# Patient Record
Sex: Female | Born: 1940 | Race: White | Hispanic: No | Smoking: Former smoker
Health system: Southern US, Community
[De-identification: ages and names within clinical notes are randomized; demographics above are authoritative.]

## PROBLEM LIST (undated history)

## (undated) DIAGNOSIS — I2510 Atherosclerotic heart disease of native coronary artery without angina pectoris: Secondary | ICD-10-CM

## (undated) DIAGNOSIS — I10 Essential (primary) hypertension: Secondary | ICD-10-CM

## (undated) DIAGNOSIS — L98499 Non-pressure chronic ulcer of skin of other sites with unspecified severity: Secondary | ICD-10-CM

## (undated) DIAGNOSIS — I219 Acute myocardial infarction, unspecified: Secondary | ICD-10-CM

## (undated) DIAGNOSIS — C8290 Follicular lymphoma, unspecified, unspecified site: Secondary | ICD-10-CM

## (undated) DIAGNOSIS — I82409 Acute embolism and thrombosis of unspecified deep veins of unspecified lower extremity: Secondary | ICD-10-CM

## (undated) DIAGNOSIS — G709 Myoneural disorder, unspecified: Secondary | ICD-10-CM

## (undated) DIAGNOSIS — I739 Peripheral vascular disease, unspecified: Secondary | ICD-10-CM

## (undated) HISTORY — DX: Acute embolism and thrombosis of unspecified deep veins of unspecified lower extremity: I82.409

## (undated) HISTORY — PX: APPENDECTOMY: SHX54

## (undated) HISTORY — DX: Essential (primary) hypertension: I10

## (undated) HISTORY — DX: Follicular lymphoma, unspecified, unspecified site: C82.90

## (undated) HISTORY — DX: Peripheral vascular disease, unspecified: L98.499

## (undated) HISTORY — DX: Acute myocardial infarction, unspecified: I21.9

## (undated) HISTORY — PX: CHOLECYSTECTOMY: SHX55

## (undated) HISTORY — PX: TONSILLECTOMY: SUR1361

## (undated) HISTORY — DX: Peripheral vascular disease, unspecified: I73.9

## (undated) HISTORY — DX: Myoneural disorder, unspecified: G70.9

## (undated) HISTORY — PX: CORONARY ANGIOPLASTY WITH STENT PLACEMENT: SHX49

## (undated) HISTORY — DX: Atherosclerotic heart disease of native coronary artery without angina pectoris: I25.10

---

## 1998-12-19 ENCOUNTER — Encounter: Admission: RE | Admit: 1998-12-19 | Discharge: 1998-12-19 | Payer: Self-pay | Admitting: Family Medicine

## 1998-12-19 ENCOUNTER — Ambulatory Visit (HOSPITAL_COMMUNITY): Admission: RE | Admit: 1998-12-19 | Discharge: 1998-12-19 | Payer: Self-pay | Admitting: Family Medicine

## 1998-12-23 ENCOUNTER — Ambulatory Visit (HOSPITAL_COMMUNITY): Admission: RE | Admit: 1998-12-23 | Discharge: 1998-12-23 | Payer: Self-pay | Admitting: Cardiovascular Disease

## 1998-12-23 ENCOUNTER — Encounter: Payer: Self-pay | Admitting: Cardiovascular Disease

## 1998-12-27 ENCOUNTER — Encounter: Admission: RE | Admit: 1998-12-27 | Discharge: 1998-12-27 | Payer: Self-pay | Admitting: Family Medicine

## 1999-01-20 ENCOUNTER — Encounter: Admission: RE | Admit: 1999-01-20 | Discharge: 1999-01-20 | Payer: Self-pay | Admitting: Family Medicine

## 1999-06-25 ENCOUNTER — Emergency Department (HOSPITAL_COMMUNITY): Admission: EM | Admit: 1999-06-25 | Discharge: 1999-06-25 | Payer: Self-pay | Admitting: Emergency Medicine

## 2000-10-28 ENCOUNTER — Encounter: Admission: RE | Admit: 2000-10-28 | Discharge: 2000-10-28 | Payer: Self-pay | Admitting: Family Medicine

## 2000-11-01 ENCOUNTER — Encounter: Admission: RE | Admit: 2000-11-01 | Discharge: 2000-11-01 | Payer: Self-pay | Admitting: Family Medicine

## 2000-11-08 ENCOUNTER — Encounter: Admission: RE | Admit: 2000-11-08 | Discharge: 2000-11-08 | Payer: Self-pay | Admitting: Family Medicine

## 2000-11-11 ENCOUNTER — Encounter: Admission: RE | Admit: 2000-11-11 | Discharge: 2001-02-09 | Payer: Self-pay | Admitting: *Deleted

## 2000-11-12 ENCOUNTER — Encounter: Admission: RE | Admit: 2000-11-12 | Discharge: 2000-11-12 | Payer: Self-pay | Admitting: Sports Medicine

## 2000-11-15 ENCOUNTER — Encounter: Admission: RE | Admit: 2000-11-15 | Discharge: 2000-11-15 | Payer: Self-pay | Admitting: Family Medicine

## 2000-11-27 ENCOUNTER — Encounter: Admission: RE | Admit: 2000-11-27 | Discharge: 2000-11-27 | Payer: Self-pay | Admitting: Family Medicine

## 2000-12-26 ENCOUNTER — Encounter (INDEPENDENT_AMBULATORY_CARE_PROVIDER_SITE_OTHER): Payer: Self-pay | Admitting: *Deleted

## 2001-01-03 ENCOUNTER — Encounter: Admission: RE | Admit: 2001-01-03 | Discharge: 2001-01-03 | Payer: Self-pay | Admitting: Family Medicine

## 2006-07-26 ENCOUNTER — Encounter (INDEPENDENT_AMBULATORY_CARE_PROVIDER_SITE_OTHER): Payer: Self-pay | Admitting: *Deleted

## 2009-02-25 DIAGNOSIS — G709 Myoneural disorder, unspecified: Secondary | ICD-10-CM

## 2009-02-25 HISTORY — DX: Myoneural disorder, unspecified: G70.9

## 2009-03-20 ENCOUNTER — Encounter: Payer: Self-pay | Admitting: Cardiology

## 2009-03-20 ENCOUNTER — Ambulatory Visit: Payer: Self-pay | Admitting: Cardiology

## 2009-03-20 ENCOUNTER — Inpatient Hospital Stay (HOSPITAL_COMMUNITY): Admission: EM | Admit: 2009-03-20 | Discharge: 2009-03-23 | Payer: Self-pay | Admitting: Emergency Medicine

## 2009-03-21 ENCOUNTER — Encounter: Payer: Self-pay | Admitting: Cardiology

## 2009-03-25 ENCOUNTER — Telehealth: Payer: Self-pay | Admitting: Cardiovascular Disease

## 2009-04-01 ENCOUNTER — Encounter: Payer: Self-pay | Admitting: Cardiovascular Disease

## 2009-04-04 ENCOUNTER — Encounter: Payer: Self-pay | Admitting: Cardiovascular Disease

## 2009-04-05 ENCOUNTER — Encounter: Payer: Self-pay | Admitting: Cardiovascular Disease

## 2009-04-05 ENCOUNTER — Ambulatory Visit: Payer: Self-pay

## 2009-04-05 DIAGNOSIS — R0989 Other specified symptoms and signs involving the circulatory and respiratory systems: Secondary | ICD-10-CM | POA: Insufficient documentation

## 2009-04-05 DIAGNOSIS — I70219 Atherosclerosis of native arteries of extremities with intermittent claudication, unspecified extremity: Secondary | ICD-10-CM | POA: Insufficient documentation

## 2009-04-12 DIAGNOSIS — D649 Anemia, unspecified: Secondary | ICD-10-CM | POA: Insufficient documentation

## 2009-04-12 DIAGNOSIS — I519 Heart disease, unspecified: Secondary | ICD-10-CM

## 2009-04-12 DIAGNOSIS — D696 Thrombocytopenia, unspecified: Secondary | ICD-10-CM | POA: Insufficient documentation

## 2009-04-12 DIAGNOSIS — I2119 ST elevation (STEMI) myocardial infarction involving other coronary artery of inferior wall: Secondary | ICD-10-CM

## 2009-04-12 DIAGNOSIS — E785 Hyperlipidemia, unspecified: Secondary | ICD-10-CM | POA: Insufficient documentation

## 2009-04-20 ENCOUNTER — Ambulatory Visit: Payer: Self-pay | Admitting: Cardiovascular Disease

## 2009-04-20 DIAGNOSIS — I10 Essential (primary) hypertension: Secondary | ICD-10-CM | POA: Insufficient documentation

## 2009-04-20 DIAGNOSIS — I2510 Atherosclerotic heart disease of native coronary artery without angina pectoris: Secondary | ICD-10-CM

## 2009-05-03 ENCOUNTER — Ambulatory Visit: Payer: Self-pay | Admitting: Cardiovascular Disease

## 2009-05-03 LAB — CONVERTED CEMR LAB
BUN: 15 mg/dL (ref 6–23)
CO2: 33 meq/L — ABNORMAL HIGH (ref 19–32)
Creatinine, Ser: 1 mg/dL (ref 0.4–1.2)
GFR calc non Af Amer: 58.43 mL/min (ref >60)
Potassium: 4.1 meq/L (ref 3.5–5.1)
Sodium: 144 meq/L (ref 135–145)

## 2009-05-10 ENCOUNTER — Ambulatory Visit: Payer: Self-pay | Admitting: Cardiovascular Disease

## 2009-05-10 LAB — CONVERTED CEMR LAB
BUN: 9 mg/dL (ref 6–23)
Calcium: 9.8 mg/dL (ref 8.4–10.5)
Chloride: 105 meq/L (ref 96–112)
Creatinine, Ser: 0.9 mg/dL (ref 0.4–1.2)
Potassium: 4.1 meq/L (ref 3.5–5.1)

## 2009-05-24 ENCOUNTER — Ambulatory Visit: Payer: Self-pay | Admitting: Cardiology

## 2009-05-24 ENCOUNTER — Ambulatory Visit: Payer: Self-pay | Admitting: Oncology

## 2009-05-24 ENCOUNTER — Telehealth (INDEPENDENT_AMBULATORY_CARE_PROVIDER_SITE_OTHER): Payer: Self-pay | Admitting: *Deleted

## 2009-05-25 ENCOUNTER — Ambulatory Visit: Payer: Self-pay | Admitting: Cardiology

## 2009-05-26 LAB — CONVERTED CEMR LAB
BUN: 14 mg/dL (ref 6–23)
Calcium: 10.2 mg/dL (ref 8.4–10.5)
Creatinine, Ser: 1 mg/dL (ref 0.4–1.2)
Glucose, Bld: 74 mg/dL (ref 70–99)
Sodium: 143 meq/L (ref 135–145)

## 2009-05-27 ENCOUNTER — Emergency Department (HOSPITAL_COMMUNITY): Admission: EM | Admit: 2009-05-27 | Discharge: 2009-05-27 | Payer: Self-pay | Admitting: Emergency Medicine

## 2009-05-30 ENCOUNTER — Encounter: Payer: Self-pay | Admitting: Cardiovascular Disease

## 2009-05-30 LAB — CBC WITH DIFFERENTIAL/PLATELET
Eosinophils Absolute: 0.2 10*3/uL (ref 0.0–0.5)
LYMPH%: 7.7 % — ABNORMAL LOW (ref 14.0–49.7)
MONO#: 1 10*3/uL — ABNORMAL HIGH (ref 0.1–0.9)
NEUT#: 8.4 10*3/uL — ABNORMAL HIGH (ref 1.5–6.5)
Platelets: 181 10*3/uL (ref 145–400)
RBC: 4.06 10*6/uL (ref 3.70–5.45)
RDW: 15.6 % — ABNORMAL HIGH (ref 11.2–14.5)
WBC: 10.4 10*3/uL — ABNORMAL HIGH (ref 3.9–10.3)

## 2009-05-30 LAB — COMPREHENSIVE METABOLIC PANEL
CO2: 28 mEq/L (ref 19–32)
Chloride: 101 mEq/L (ref 96–112)
Glucose, Bld: 104 mg/dL — ABNORMAL HIGH (ref 70–99)
Potassium: 4.4 mEq/L (ref 3.5–5.3)
Sodium: 138 mEq/L (ref 135–145)
Total Protein: 7.4 g/dL (ref 6.0–8.3)

## 2009-05-31 ENCOUNTER — Other Ambulatory Visit: Admission: RE | Admit: 2009-05-31 | Discharge: 2009-05-31 | Payer: Self-pay | Admitting: Oncology

## 2009-06-01 ENCOUNTER — Ambulatory Visit: Payer: Self-pay

## 2009-06-01 ENCOUNTER — Ambulatory Visit: Payer: Self-pay | Admitting: Cardiovascular Disease

## 2009-06-01 DIAGNOSIS — I739 Peripheral vascular disease, unspecified: Secondary | ICD-10-CM | POA: Insufficient documentation

## 2009-06-01 LAB — FOLATE: Folate: >20 ng/mL

## 2009-06-01 LAB — IRON AND TIBC
%SAT: 7 % — ABNORMAL LOW (ref 20–55)
Iron: 23 ug/dL — ABNORMAL LOW (ref 42–145)
TIBC: 330 ug/dL (ref 250–470)
UIBC: 307 ug/dL

## 2009-06-01 LAB — PROTEIN ELECTROPHORESIS, SERUM
Alpha-1-Globulin: 7.2 % — ABNORMAL HIGH (ref 2.9–4.9)
Alpha-2-Globulin: 15 % — ABNORMAL HIGH (ref 7.1–11.8)
Beta 2: 4.4 % (ref 3.2–6.5)
Beta Globulin: 6.5 % (ref 4.7–7.2)
Gamma Globulin: 15 % (ref 11.1–18.8)

## 2009-06-01 LAB — KAPPA/LAMBDA LIGHT CHAINS: Kappa:Lambda Ratio: 1.61 (ref 0.26–1.65)

## 2009-06-01 LAB — TSH: TSH: 1.562 u[IU]/mL (ref 0.350–4.500)

## 2009-06-06 LAB — CONVERTED CEMR LAB
BUN: 16 mg/dL (ref 6–23)
Creatinine, Ser: 1 mg/dL (ref 0.4–1.2)
GFR calc non Af Amer: 58.41 mL/min (ref >60)
Sodium: 142 meq/L (ref 135–145)

## 2009-06-08 ENCOUNTER — Telehealth: Payer: Self-pay | Admitting: Cardiovascular Disease

## 2009-06-10 ENCOUNTER — Telehealth (INDEPENDENT_AMBULATORY_CARE_PROVIDER_SITE_OTHER): Payer: Self-pay | Admitting: *Deleted

## 2009-06-10 ENCOUNTER — Telehealth: Payer: Self-pay | Admitting: Cardiovascular Disease

## 2009-06-10 ENCOUNTER — Ambulatory Visit (HOSPITAL_COMMUNITY): Admission: RE | Admit: 2009-06-10 | Discharge: 2009-06-10 | Payer: Self-pay | Admitting: Oncology

## 2009-06-15 ENCOUNTER — Ambulatory Visit (HOSPITAL_COMMUNITY): Admission: RE | Admit: 2009-06-15 | Discharge: 2009-06-15 | Payer: Self-pay | Admitting: Oncology

## 2009-06-20 ENCOUNTER — Telehealth: Payer: Self-pay | Admitting: Cardiovascular Disease

## 2009-06-21 ENCOUNTER — Ambulatory Visit: Payer: Self-pay

## 2009-06-21 ENCOUNTER — Ambulatory Visit (HOSPITAL_COMMUNITY): Admission: RE | Admit: 2009-06-21 | Discharge: 2009-06-21 | Payer: Self-pay | Admitting: Cardiovascular Disease

## 2009-06-21 ENCOUNTER — Encounter: Payer: Self-pay | Admitting: Cardiovascular Disease

## 2009-06-21 ENCOUNTER — Ambulatory Visit: Payer: Self-pay | Admitting: Cardiovascular Disease

## 2009-06-28 ENCOUNTER — Ambulatory Visit (HOSPITAL_COMMUNITY): Admission: RE | Admit: 2009-06-28 | Discharge: 2009-06-28 | Payer: Self-pay | Admitting: Oncology

## 2009-06-30 ENCOUNTER — Encounter: Payer: Self-pay | Admitting: Cardiovascular Disease

## 2009-06-30 ENCOUNTER — Ambulatory Visit: Payer: Self-pay | Admitting: Oncology

## 2009-07-05 ENCOUNTER — Telehealth: Payer: Self-pay | Admitting: Cardiovascular Disease

## 2009-07-05 LAB — CBC WITH DIFFERENTIAL/PLATELET
BASO%: 0.3 % (ref 0.0–2.0)
Eosinophils Absolute: 0.1 10*3/uL (ref 0.0–0.5)
MONO#: 0.7 10*3/uL (ref 0.1–0.9)
NEUT#: 7.6 10*3/uL — ABNORMAL HIGH (ref 1.5–6.5)
Platelets: 272 10*3/uL (ref 145–400)
RBC: 3.98 10*6/uL (ref 3.70–5.45)
RDW: 15.5 % — ABNORMAL HIGH (ref 11.2–14.5)
WBC: 9.1 10*3/uL (ref 3.9–10.3)
lymph#: 0.6 10*3/uL — ABNORMAL LOW (ref 0.9–3.3)

## 2009-07-05 LAB — COMPREHENSIVE METABOLIC PANEL
ALT: 22 U/L (ref 0–35)
Albumin: 3.8 g/dL (ref 3.5–5.2)
CO2: 26 mEq/L (ref 19–32)
Chloride: 99 mEq/L (ref 96–112)
Glucose, Bld: 99 mg/dL (ref 70–99)
Potassium: 3.9 mEq/L (ref 3.5–5.3)
Sodium: 140 mEq/L (ref 135–145)
Total Protein: 7 g/dL (ref 6.0–8.3)

## 2009-07-05 LAB — URIC ACID: Uric Acid, Serum: 6.3 mg/dL (ref 2.4–7.0)

## 2009-07-08 LAB — COMPREHENSIVE METABOLIC PANEL
BUN: 18 mg/dL (ref 6–23)
CO2: 26 mEq/L (ref 19–32)
Calcium: 9.1 mg/dL (ref 8.4–10.5)
Creatinine, Ser: 1.04 mg/dL (ref 0.40–1.20)
Glucose, Bld: 114 mg/dL — ABNORMAL HIGH (ref 70–99)
Total Bilirubin: 0.2 mg/dL — ABNORMAL LOW (ref 0.3–1.2)

## 2009-07-08 LAB — CBC WITH DIFFERENTIAL/PLATELET
Basophils Absolute: 0.1 10*3/uL (ref 0.0–0.1)
EOS%: 1.1 % (ref 0.0–7.0)
HCT: 35.2 % (ref 34.8–46.6)
HGB: 11 g/dL — ABNORMAL LOW (ref 11.6–15.9)
LYMPH%: 8.2 % — ABNORMAL LOW (ref 14.0–49.7)
MCH: 27.5 pg (ref 25.1–34.0)
MCV: 88 fL (ref 79.5–101.0)
MONO%: 6.3 % (ref 0.0–14.0)
NEUT%: 83.5 % — ABNORMAL HIGH (ref 38.4–76.8)
Platelets: 277 10*3/uL (ref 145–400)
RDW: 14.7 % — ABNORMAL HIGH (ref 11.2–14.5)

## 2009-07-08 LAB — URIC ACID: Uric Acid, Serum: 4.8 mg/dL (ref 2.4–7.0)

## 2009-07-08 LAB — LACTATE DEHYDROGENASE: LDH: 372 U/L — ABNORMAL HIGH (ref 94–250)

## 2009-07-15 LAB — CBC WITH DIFFERENTIAL/PLATELET
Basophils Absolute: 0 10*3/uL (ref 0.0–0.1)
EOS%: 8.3 % — ABNORMAL HIGH (ref 0.0–7.0)
Eosinophils Absolute: 0.1 10*3/uL (ref 0.0–0.5)
HGB: 10.6 g/dL — ABNORMAL LOW (ref 11.6–15.9)
NEUT#: 0.1 10*3/uL — CL (ref 1.5–6.5)
RBC: 3.85 10*6/uL (ref 3.70–5.45)
RDW: 15.1 % — ABNORMAL HIGH (ref 11.2–14.5)
lymph#: 0.4 10*3/uL — ABNORMAL LOW (ref 0.9–3.3)
nRBC: 0 % (ref 0–0)

## 2009-07-15 LAB — COMPREHENSIVE METABOLIC PANEL
ALT: 19 U/L (ref 0–35)
Albumin: 3.2 g/dL — ABNORMAL LOW (ref 3.5–5.2)
Alkaline Phosphatase: 82 U/L (ref 39–117)
CO2: 25 mEq/L (ref 19–32)
Glucose, Bld: 163 mg/dL — ABNORMAL HIGH (ref 70–99)
Potassium: 4.6 mEq/L (ref 3.5–5.3)
Sodium: 140 mEq/L (ref 135–145)
Total Bilirubin: 0.3 mg/dL (ref 0.3–1.2)
Total Protein: 5.5 g/dL — ABNORMAL LOW (ref 6.0–8.3)

## 2009-07-15 LAB — URIC ACID: Uric Acid, Serum: 2.4 mg/dL (ref 2.4–7.0)

## 2009-07-29 ENCOUNTER — Encounter: Payer: Self-pay | Admitting: Oncology

## 2009-07-29 ENCOUNTER — Ambulatory Visit: Payer: Self-pay | Admitting: Vascular Surgery

## 2009-07-29 ENCOUNTER — Ambulatory Visit: Admission: RE | Admit: 2009-07-29 | Discharge: 2009-07-29 | Payer: Self-pay | Admitting: Oncology

## 2009-07-29 ENCOUNTER — Encounter: Payer: Self-pay | Admitting: Cardiovascular Disease

## 2009-07-29 LAB — LACTATE DEHYDROGENASE: LDH: 284 U/L — ABNORMAL HIGH (ref 94–250)

## 2009-07-29 LAB — CBC WITH DIFFERENTIAL/PLATELET
Basophils Absolute: 0.2 10*3/uL — ABNORMAL HIGH (ref 0.0–0.1)
Eosinophils Absolute: 0.3 10*3/uL (ref 0.0–0.5)
HCT: 34.4 % — ABNORMAL LOW (ref 34.8–46.6)
HGB: 10.5 g/dL — ABNORMAL LOW (ref 11.6–15.9)
LYMPH%: 10.3 % — ABNORMAL LOW (ref 14.0–49.7)
MONO#: 1.7 10*3/uL — ABNORMAL HIGH (ref 0.1–0.9)
NEUT#: 13.2 10*3/uL — ABNORMAL HIGH (ref 1.5–6.5)
Platelets: 332 10*3/uL (ref 145–400)
RBC: 3.79 10*6/uL (ref 3.70–5.45)
WBC: 17.2 10*3/uL — ABNORMAL HIGH (ref 3.9–10.3)
nRBC: 0 % (ref 0–0)

## 2009-07-29 LAB — COMPREHENSIVE METABOLIC PANEL
ALT: 19 U/L (ref 0–35)
CO2: 24 mEq/L (ref 19–32)
Calcium: 9.1 mg/dL (ref 8.4–10.5)
Chloride: 105 mEq/L (ref 96–112)
Creatinine, Ser: 0.78 mg/dL (ref 0.40–1.20)
Total Protein: 6.4 g/dL (ref 6.0–8.3)

## 2009-08-02 ENCOUNTER — Ambulatory Visit: Payer: Self-pay | Admitting: Oncology

## 2009-08-05 ENCOUNTER — Telehealth: Payer: Self-pay | Admitting: Cardiovascular Disease

## 2009-08-05 LAB — CBC WITH DIFFERENTIAL/PLATELET
EOS%: 22.9 % — ABNORMAL HIGH (ref 0.0–7.0)
MCH: 27.5 pg (ref 25.1–34.0)
MCV: 89.4 fL (ref 79.5–101.0)
MONO%: 10 % (ref 0.0–14.0)
NEUT#: 0 10*3/uL — CL (ref 1.5–6.5)
RBC: 3.49 10*6/uL — ABNORMAL LOW (ref 3.70–5.45)
RDW: 16.8 % — ABNORMAL HIGH (ref 11.2–14.5)
nRBC: 0 % (ref 0–0)

## 2009-08-05 LAB — COMPREHENSIVE METABOLIC PANEL
ALT: 11 U/L (ref 0–35)
Alkaline Phosphatase: 98 U/L (ref 39–117)
Sodium: 137 mEq/L (ref 135–145)
Total Bilirubin: 0.4 mg/dL (ref 0.3–1.2)
Total Protein: 6.2 g/dL (ref 6.0–8.3)

## 2009-08-19 ENCOUNTER — Encounter: Payer: Self-pay | Admitting: Cardiovascular Disease

## 2009-08-19 LAB — COMPREHENSIVE METABOLIC PANEL
ALT: 15 U/L (ref 0–35)
BUN: 13 mg/dL (ref 6–23)
CO2: 24 mEq/L (ref 19–32)
Calcium: 9.2 mg/dL (ref 8.4–10.5)
Chloride: 105 mEq/L (ref 96–112)
Creatinine, Ser: 0.82 mg/dL (ref 0.40–1.20)

## 2009-08-19 LAB — CBC WITH DIFFERENTIAL/PLATELET
BASO%: 0.7 % (ref 0.0–2.0)
EOS%: 0.4 % (ref 0.0–7.0)
Eosinophils Absolute: 0.1 10*3/uL (ref 0.0–0.5)
MCHC: 31.4 g/dL — ABNORMAL LOW (ref 31.5–36.0)
MCV: 91.8 fL (ref 79.5–101.0)
MONO%: 6.5 % (ref 0.0–14.0)
NEUT#: 13.3 10*3/uL — ABNORMAL HIGH (ref 1.5–6.5)
RBC: 3.68 10*6/uL — ABNORMAL LOW (ref 3.70–5.45)
RDW: 20.2 % — ABNORMAL HIGH (ref 11.2–14.5)

## 2009-08-19 LAB — LACTATE DEHYDROGENASE: LDH: 207 U/L (ref 94–250)

## 2009-08-26 LAB — CBC WITH DIFFERENTIAL/PLATELET
BASO%: 0.9 % (ref 0.0–2.0)
EOS%: 8.2 % — ABNORMAL HIGH (ref 0.0–7.0)
HCT: 31.2 % — ABNORMAL LOW (ref 34.8–46.6)
LYMPH%: 30 % (ref 14.0–49.7)
MCH: 30.1 pg (ref 25.1–34.0)
MCHC: 33.1 g/dL (ref 31.5–36.0)
MONO#: 0 10*3/uL — ABNORMAL LOW (ref 0.1–0.9)
MONO%: 1.5 % (ref 0.0–14.0)
NEUT%: 59.4 % (ref 38.4–76.8)
Platelets: 145 10*3/uL (ref 145–400)
RBC: 3.42 10*6/uL — ABNORMAL LOW (ref 3.70–5.45)
WBC: 1.4 10*3/uL — ABNORMAL LOW (ref 3.9–10.3)

## 2009-08-26 LAB — COMPREHENSIVE METABOLIC PANEL
Alkaline Phosphatase: 94 U/L (ref 39–117)
BUN: 18 mg/dL (ref 6–23)
CO2: 21 mEq/L (ref 19–32)
Creatinine, Ser: 0.76 mg/dL (ref 0.40–1.20)
Glucose, Bld: 109 mg/dL — ABNORMAL HIGH (ref 70–99)
Total Bilirubin: 0.2 mg/dL — ABNORMAL LOW (ref 0.3–1.2)
Total Protein: 6.3 g/dL (ref 6.0–8.3)

## 2009-08-26 LAB — PROTIME-INR: Protime: 40.8 Seconds — ABNORMAL HIGH (ref 10.6–13.4)

## 2009-09-02 ENCOUNTER — Ambulatory Visit: Payer: Self-pay | Admitting: Oncology

## 2009-09-02 LAB — PROTHROMBIN TIME
INR: 5.14 (ref <1.50)
Prothrombin Time: 47.1 seconds — ABNORMAL HIGH (ref 11.6–15.2)

## 2009-09-02 LAB — CBC WITH DIFFERENTIAL/PLATELET
Basophils Absolute: 0.1 10*3/uL (ref 0.0–0.1)
Eosinophils Absolute: 0.1 10*3/uL (ref 0.0–0.5)
HGB: 10.2 g/dL — ABNORMAL LOW (ref 11.6–15.9)
LYMPH%: 4.4 % — ABNORMAL LOW (ref 14.0–49.7)
MCV: 93.5 fL (ref 79.5–101.0)
MONO#: 1.7 10*3/uL — ABNORMAL HIGH (ref 0.1–0.9)
MONO%: 8.2 % (ref 0.0–14.0)
NEUT#: 17.7 10*3/uL — ABNORMAL HIGH (ref 1.5–6.5)
Platelets: 135 10*3/uL — ABNORMAL LOW (ref 145–400)
RBC: 3.53 10*6/uL — ABNORMAL LOW (ref 3.70–5.45)
WBC: 20.5 10*3/uL — ABNORMAL HIGH (ref 3.9–10.3)
nRBC: 0 % (ref 0–0)

## 2009-09-02 LAB — PROTIME-INR

## 2009-09-08 ENCOUNTER — Ambulatory Visit (HOSPITAL_COMMUNITY): Admission: RE | Admit: 2009-09-08 | Discharge: 2009-09-08 | Payer: Self-pay | Admitting: Oncology

## 2009-09-09 ENCOUNTER — Encounter: Payer: Self-pay | Admitting: Cardiovascular Disease

## 2009-09-09 LAB — CBC WITH DIFFERENTIAL/PLATELET
EOS%: 1.3 % (ref 0.0–7.0)
MCH: 29.7 pg (ref 25.1–34.0)
MCV: 94.9 fL (ref 79.5–101.0)
MONO%: 11.6 % (ref 0.0–14.0)
NEUT#: 8.9 10*3/uL — ABNORMAL HIGH (ref 1.5–6.5)
RBC: 3.33 10*6/uL — ABNORMAL LOW (ref 3.70–5.45)
RDW: 21.6 % — ABNORMAL HIGH (ref 11.2–14.5)
lymph#: 0.8 10*3/uL — ABNORMAL LOW (ref 0.9–3.3)

## 2009-09-09 LAB — COMPREHENSIVE METABOLIC PANEL
AST: 19 U/L (ref 0–37)
Alkaline Phosphatase: 72 U/L (ref 39–117)
BUN: 13 mg/dL (ref 6–23)
Creatinine, Ser: 0.77 mg/dL (ref 0.40–1.20)
Total Bilirubin: 0.2 mg/dL — ABNORMAL LOW (ref 0.3–1.2)

## 2009-09-09 LAB — PROTIME-INR
INR: 2.3 (ref 2.00–3.50)
Protime: 27.6 Seconds — ABNORMAL HIGH (ref 10.6–13.4)

## 2009-09-27 ENCOUNTER — Ambulatory Visit: Payer: Self-pay | Admitting: Cardiovascular Disease

## 2009-09-30 ENCOUNTER — Ambulatory Visit: Payer: Self-pay | Admitting: Oncology

## 2009-09-30 ENCOUNTER — Encounter: Payer: Self-pay | Admitting: Cardiovascular Disease

## 2009-09-30 LAB — CBC WITH DIFFERENTIAL/PLATELET
BASO%: 1 % (ref 0.0–2.0)
EOS%: 0.5 % (ref 0.0–7.0)
HGB: 9 g/dL — ABNORMAL LOW (ref 11.6–15.9)
MCH: 28.6 pg (ref 25.1–34.0)
MCHC: 30.2 g/dL — ABNORMAL LOW (ref 31.5–36.0)
RBC: 3.15 10*6/uL — ABNORMAL LOW (ref 3.70–5.45)
RDW: 20.6 % — ABNORMAL HIGH (ref 11.2–14.5)
lymph#: 0.9 10*3/uL (ref 0.9–3.3)
nRBC: 0 % (ref 0–0)

## 2009-09-30 LAB — COMPREHENSIVE METABOLIC PANEL
Albumin: 3.5 g/dL (ref 3.5–5.2)
CO2: 23 mEq/L (ref 19–32)
Calcium: 8.7 mg/dL (ref 8.4–10.5)
Glucose, Bld: 129 mg/dL — ABNORMAL HIGH (ref 70–99)
Sodium: 140 mEq/L (ref 135–145)
Total Bilirubin: 0.2 mg/dL — ABNORMAL LOW (ref 0.3–1.2)
Total Protein: 6.2 g/dL (ref 6.0–8.3)

## 2009-09-30 LAB — LACTATE DEHYDROGENASE: LDH: 267 U/L — ABNORMAL HIGH (ref 94–250)

## 2009-09-30 LAB — PROTIME-INR

## 2009-09-30 LAB — URIC ACID: Uric Acid, Serum: 2.4 mg/dL (ref 2.4–7.0)

## 2009-10-13 ENCOUNTER — Encounter: Payer: Self-pay | Admitting: Cardiovascular Disease

## 2009-10-14 ENCOUNTER — Encounter: Payer: Self-pay | Admitting: Cardiovascular Disease

## 2009-10-14 ENCOUNTER — Ambulatory Visit: Payer: Self-pay

## 2009-10-14 ENCOUNTER — Encounter (INDEPENDENT_AMBULATORY_CARE_PROVIDER_SITE_OTHER): Payer: Self-pay | Admitting: *Deleted

## 2009-10-14 ENCOUNTER — Ambulatory Visit: Payer: Self-pay | Admitting: Cardiology

## 2009-10-14 ENCOUNTER — Ambulatory Visit (HOSPITAL_COMMUNITY): Admission: RE | Admit: 2009-10-14 | Discharge: 2009-10-14 | Payer: Self-pay | Admitting: Cardiovascular Disease

## 2009-10-20 ENCOUNTER — Encounter (HOSPITAL_COMMUNITY): Admission: RE | Admit: 2009-10-20 | Discharge: 2009-10-20 | Payer: Self-pay | Admitting: Oncology

## 2009-10-20 ENCOUNTER — Encounter: Payer: Self-pay | Admitting: Cardiovascular Disease

## 2009-10-20 LAB — COMPREHENSIVE METABOLIC PANEL
ALT: 35 U/L (ref 0–35)
Albumin: 2.7 g/dL — ABNORMAL LOW (ref 3.5–5.2)
Alkaline Phosphatase: 67 U/L (ref 39–117)
CO2: 26 mEq/L (ref 19–32)
Glucose, Bld: 121 mg/dL — ABNORMAL HIGH (ref 70–99)
Potassium: 3.9 mEq/L (ref 3.5–5.3)
Sodium: 142 mEq/L (ref 135–145)
Total Protein: 5.7 g/dL — ABNORMAL LOW (ref 6.0–8.3)

## 2009-10-20 LAB — CBC WITH DIFFERENTIAL/PLATELET
BASO%: 0.8 % (ref 0.0–2.0)
Eosinophils Absolute: 0.1 10*3/uL (ref 0.0–0.5)
MCHC: 29 g/dL — ABNORMAL LOW (ref 31.5–36.0)
MONO#: 1.4 10*3/uL — ABNORMAL HIGH (ref 0.1–0.9)
MONO%: 12.6 % (ref 0.0–14.0)
NEUT#: 8.9 10*3/uL — ABNORMAL HIGH (ref 1.5–6.5)
RBC: 2.67 10*6/uL — ABNORMAL LOW (ref 3.70–5.45)
RDW: 20.6 % — ABNORMAL HIGH (ref 11.2–14.5)
WBC: 11.2 10*3/uL — ABNORMAL HIGH (ref 3.9–10.3)

## 2009-10-20 LAB — PROTIME-INR
INR: 4.4 — ABNORMAL HIGH (ref 2.00–3.50)
Protime: 52.8 Seconds — ABNORMAL HIGH (ref 10.6–13.4)

## 2009-11-09 ENCOUNTER — Ambulatory Visit: Payer: Self-pay | Admitting: Oncology

## 2009-11-11 ENCOUNTER — Encounter: Payer: Self-pay | Admitting: Cardiovascular Disease

## 2009-11-11 LAB — COMPREHENSIVE METABOLIC PANEL
ALT: 30 U/L (ref 0–35)
AST: 30 U/L (ref 0–37)
Albumin: 4.1 g/dL (ref 3.5–5.2)
Alkaline Phosphatase: 100 U/L (ref 39–117)
Potassium: 4.1 mEq/L (ref 3.5–5.3)
Sodium: 142 mEq/L (ref 135–145)
Total Protein: 6.8 g/dL (ref 6.0–8.3)

## 2009-11-11 LAB — CBC WITH DIFFERENTIAL/PLATELET
BASO%: 0.5 % (ref 0.0–2.0)
Eosinophils Absolute: 0.1 10*3/uL (ref 0.0–0.5)
LYMPH%: 5.4 % — ABNORMAL LOW (ref 14.0–49.7)
MCHC: 30.7 g/dL — ABNORMAL LOW (ref 31.5–36.0)
MCV: 96 fL (ref 79.5–101.0)
MONO%: 5.1 % (ref 0.0–14.0)
Platelets: 264 10*3/uL (ref 145–400)
RBC: 3.53 10*6/uL — ABNORMAL LOW (ref 3.70–5.45)
nRBC: 0 % (ref 0–0)

## 2009-11-14 ENCOUNTER — Ambulatory Visit (HOSPITAL_COMMUNITY): Admission: RE | Admit: 2009-11-14 | Discharge: 2009-11-14 | Payer: Self-pay | Admitting: Oncology

## 2009-11-29 ENCOUNTER — Ambulatory Visit (HOSPITAL_COMMUNITY): Admission: RE | Admit: 2009-11-29 | Discharge: 2009-11-29 | Payer: Self-pay | Admitting: Oncology

## 2009-12-01 ENCOUNTER — Encounter: Payer: Self-pay | Admitting: Cardiovascular Disease

## 2009-12-02 ENCOUNTER — Encounter: Payer: Self-pay | Admitting: Oncology

## 2009-12-02 ENCOUNTER — Ambulatory Visit: Admission: RE | Admit: 2009-12-02 | Discharge: 2009-12-02 | Payer: Self-pay | Admitting: Oncology

## 2009-12-02 ENCOUNTER — Ambulatory Visit: Payer: Self-pay | Admitting: Surgery

## 2009-12-02 LAB — CBC WITH DIFFERENTIAL/PLATELET
BASO%: 1.4 % (ref 0.0–2.0)
Basophils Absolute: 0.1 10*3/uL (ref 0.0–0.1)
EOS%: 6.5 % (ref 0.0–7.0)
HGB: 10.8 g/dL — ABNORMAL LOW (ref 11.6–15.9)
MCH: 30.6 pg (ref 25.1–34.0)
MCHC: 31.1 g/dL — ABNORMAL LOW (ref 31.5–36.0)
MONO#: 0.9 10*3/uL (ref 0.1–0.9)
RDW: 17.9 % — ABNORMAL HIGH (ref 11.2–14.5)
WBC: 7.3 10*3/uL (ref 3.9–10.3)
lymph#: 1.1 10*3/uL (ref 0.9–3.3)

## 2009-12-02 LAB — PROTIME-INR
INR: 3.8 — ABNORMAL HIGH (ref 2.00–3.50)
Protime: 45.6 Seconds — ABNORMAL HIGH (ref 10.6–13.4)

## 2009-12-14 ENCOUNTER — Ambulatory Visit: Payer: Self-pay | Admitting: Oncology

## 2009-12-19 LAB — PROTIME-INR
INR: 2.1 (ref 2.00–3.50)
Protime: 25.2 Seconds — ABNORMAL HIGH (ref 10.6–13.4)

## 2009-12-30 LAB — PROTIME-INR: INR: 4.7 — ABNORMAL HIGH (ref 2.00–3.50)

## 2010-01-13 ENCOUNTER — Ambulatory Visit: Payer: Self-pay | Admitting: Oncology

## 2010-01-13 LAB — PROTIME-INR: INR: 2.6 (ref 2.00–3.50)

## 2010-01-27 ENCOUNTER — Encounter: Payer: Self-pay | Admitting: Cardiovascular Disease

## 2010-01-27 LAB — COMPREHENSIVE METABOLIC PANEL
ALT: 18 U/L (ref 0–35)
AST: 22 U/L (ref 0–37)
CO2: 27 mEq/L (ref 19–32)
Calcium: 9.5 mg/dL (ref 8.4–10.5)
Chloride: 107 mEq/L (ref 96–112)
Sodium: 142 mEq/L (ref 135–145)
Total Protein: 5.7 g/dL — ABNORMAL LOW (ref 6.0–8.3)

## 2010-01-27 LAB — PROTIME-INR: INR: 4.2 — ABNORMAL HIGH (ref 2.00–3.50)

## 2010-01-27 LAB — CBC WITH DIFFERENTIAL/PLATELET
BASO%: 0.4 % (ref 0.0–2.0)
Basophils Absolute: 0 10*3/uL (ref 0.0–0.1)
EOS%: 2.2 % (ref 0.0–7.0)
HCT: 34.3 % — ABNORMAL LOW (ref 34.8–46.6)
MCH: 29.8 pg (ref 25.1–34.0)
MCHC: 30.9 g/dL — ABNORMAL LOW (ref 31.5–36.0)
MCV: 96.3 fL (ref 79.5–101.0)
MONO%: 7.7 % (ref 0.0–14.0)
NEUT%: 76.7 % (ref 38.4–76.8)
RDW: 13.6 % (ref 11.2–14.5)
lymph#: 1.1 10*3/uL (ref 0.9–3.3)

## 2010-01-27 LAB — LACTATE DEHYDROGENASE: LDH: 201 U/L (ref 94–250)

## 2010-01-30 ENCOUNTER — Encounter (INDEPENDENT_AMBULATORY_CARE_PROVIDER_SITE_OTHER): Payer: Self-pay | Admitting: Emergency Medicine

## 2010-01-30 ENCOUNTER — Inpatient Hospital Stay (HOSPITAL_COMMUNITY): Admission: EM | Admit: 2010-01-30 | Discharge: 2010-02-06 | Payer: Self-pay | Admitting: Emergency Medicine

## 2010-01-30 ENCOUNTER — Ambulatory Visit: Payer: Self-pay | Admitting: Cardiovascular Disease

## 2010-01-30 ENCOUNTER — Ambulatory Visit: Payer: Self-pay | Admitting: Internal Medicine

## 2010-02-01 ENCOUNTER — Encounter: Payer: Self-pay | Admitting: Cardiology

## 2010-02-01 ENCOUNTER — Ambulatory Visit: Payer: Self-pay | Admitting: Internal Medicine

## 2010-02-03 ENCOUNTER — Ambulatory Visit: Payer: Self-pay | Admitting: Oncology

## 2010-02-10 ENCOUNTER — Encounter: Payer: Self-pay | Admitting: Ophthalmology

## 2010-02-10 ENCOUNTER — Ambulatory Visit: Payer: Self-pay | Admitting: Cardiovascular Disease

## 2010-02-10 ENCOUNTER — Inpatient Hospital Stay (HOSPITAL_COMMUNITY): Admission: AD | Admit: 2010-02-10 | Discharge: 2010-02-23 | Payer: Self-pay | Admitting: Internal Medicine

## 2010-02-10 ENCOUNTER — Ambulatory Visit: Payer: Self-pay | Admitting: Internal Medicine

## 2010-02-11 ENCOUNTER — Encounter: Payer: Self-pay | Admitting: Internal Medicine

## 2010-02-11 ENCOUNTER — Ambulatory Visit: Payer: Self-pay | Admitting: Vascular Surgery

## 2010-02-14 ENCOUNTER — Ambulatory Visit: Payer: Self-pay | Admitting: Oncology

## 2010-02-14 ENCOUNTER — Encounter: Payer: Self-pay | Admitting: Vascular Surgery

## 2010-02-15 HISTORY — PX: FEMORAL-POPLITEAL BYPASS GRAFT: SHX937

## 2010-02-23 ENCOUNTER — Encounter: Payer: Self-pay | Admitting: Internal Medicine

## 2010-02-23 DIAGNOSIS — I82409 Acute embolism and thrombosis of unspecified deep veins of unspecified lower extremity: Secondary | ICD-10-CM | POA: Insufficient documentation

## 2010-03-03 ENCOUNTER — Ambulatory Visit: Payer: Self-pay | Admitting: Vascular Surgery

## 2010-03-13 ENCOUNTER — Ambulatory Visit (HOSPITAL_COMMUNITY): Admission: RE | Admit: 2010-03-13 | Discharge: 2010-03-13 | Payer: Self-pay | Admitting: Vascular Surgery

## 2010-03-13 ENCOUNTER — Ambulatory Visit: Payer: Self-pay | Admitting: Vascular Surgery

## 2010-03-13 HISTORY — PX: TOE AMPUTATION: SHX809

## 2010-03-29 ENCOUNTER — Ambulatory Visit: Payer: Self-pay | Admitting: Oncology

## 2010-03-31 ENCOUNTER — Encounter: Payer: Self-pay | Admitting: Cardiovascular Disease

## 2010-03-31 LAB — CBC WITH DIFFERENTIAL/PLATELET
BASO%: 1.1 % (ref 0.0–2.0)
Basophils Absolute: 0.1 10*3/uL (ref 0.0–0.1)
EOS%: 2.9 % (ref 0.0–7.0)
HCT: 35.3 % (ref 34.8–46.6)
HGB: 10.9 g/dL — ABNORMAL LOW (ref 11.6–15.9)
LYMPH%: 15.7 % (ref 14.0–49.7)
MCH: 29.2 pg (ref 25.1–34.0)
MCHC: 30.9 g/dL — ABNORMAL LOW (ref 31.5–36.0)
MCV: 94.6 fL (ref 79.5–101.0)
MONO%: 7.1 % (ref 0.0–14.0)
NEUT%: 73.2 % (ref 38.4–76.8)
Platelets: 237 10*3/uL (ref 145–400)

## 2010-03-31 LAB — COMPREHENSIVE METABOLIC PANEL
ALT: 21 U/L (ref 0–35)
AST: 25 U/L (ref 0–37)
Creatinine, Ser: 0.82 mg/dL (ref 0.40–1.20)
Sodium: 142 mEq/L (ref 135–145)
Total Bilirubin: 0.2 mg/dL — ABNORMAL LOW (ref 0.3–1.2)
Total Protein: 6.5 g/dL (ref 6.0–8.3)

## 2010-04-06 ENCOUNTER — Telehealth: Payer: Self-pay | Admitting: Cardiovascular Disease

## 2010-04-07 ENCOUNTER — Ambulatory Visit: Payer: Self-pay | Admitting: Vascular Surgery

## 2010-04-24 ENCOUNTER — Telehealth: Payer: Self-pay | Admitting: Cardiovascular Disease

## 2010-05-04 ENCOUNTER — Encounter: Payer: Self-pay | Admitting: Cardiovascular Disease

## 2010-05-05 ENCOUNTER — Ambulatory Visit: Payer: Self-pay

## 2010-05-05 ENCOUNTER — Encounter: Payer: Self-pay | Admitting: Cardiovascular Disease

## 2010-05-10 ENCOUNTER — Ambulatory Visit: Payer: Self-pay | Admitting: Cardiovascular Disease

## 2010-05-19 ENCOUNTER — Ambulatory Visit: Payer: Self-pay | Admitting: Vascular Surgery

## 2010-05-26 ENCOUNTER — Ambulatory Visit: Payer: Self-pay | Admitting: Vascular Surgery

## 2010-06-02 ENCOUNTER — Ambulatory Visit: Payer: Self-pay | Admitting: Oncology

## 2010-06-02 ENCOUNTER — Encounter: Payer: Self-pay | Admitting: Cardiovascular Disease

## 2010-06-02 LAB — CBC WITH DIFFERENTIAL/PLATELET
BASO%: 0.7 % (ref 0.0–2.0)
Basophils Absolute: 0.1 10*3/uL (ref 0.0–0.1)
EOS%: 2.6 % (ref 0.0–7.0)
Eosinophils Absolute: 0.2 10*3/uL (ref 0.0–0.5)
HCT: 41.3 % (ref 34.8–46.6)
HGB: 12.9 g/dL (ref 11.6–15.9)
LYMPH%: 14.6 % (ref 14.0–49.7)
MCH: 29.2 pg (ref 25.1–34.0)
MCHC: 31.2 g/dL — ABNORMAL LOW (ref 31.5–36.0)
MCV: 93.4 fL (ref 79.5–101.0)
MONO#: 0.5 10*3/uL (ref 0.1–0.9)
MONO%: 7.4 % (ref 0.0–14.0)
NEUT#: 5.4 10*3/uL (ref 1.5–6.5)
NEUT%: 74.7 % (ref 38.4–76.8)
Platelets: 170 10*3/uL (ref 145–400)
RBC: 4.42 10*6/uL (ref 3.70–5.45)
RDW: 14.4 % (ref 11.2–14.5)
WBC: 7.2 10*3/uL (ref 3.9–10.3)
lymph#: 1.1 10*3/uL (ref 0.9–3.3)
nRBC: 0 % (ref 0–0)

## 2010-06-02 LAB — LACTATE DEHYDROGENASE: LDH: 252 U/L — ABNORMAL HIGH (ref 94–250)

## 2010-06-02 LAB — COMPREHENSIVE METABOLIC PANEL
ALT: 25 U/L (ref 0–35)
AST: 31 U/L (ref 0–37)
Albumin: 4.2 g/dL (ref 3.5–5.2)
Alkaline Phosphatase: 98 U/L (ref 39–117)
BUN: 19 mg/dL (ref 6–23)
CO2: 28 mEq/L (ref 19–32)
Calcium: 9.3 mg/dL (ref 8.4–10.5)
Chloride: 105 mEq/L (ref 96–112)
Creatinine, Ser: 0.87 mg/dL (ref 0.40–1.20)
Glucose, Bld: 85 mg/dL (ref 70–99)
Potassium: 4.6 mEq/L (ref 3.5–5.3)
Sodium: 142 mEq/L (ref 135–145)
Total Bilirubin: 0.3 mg/dL (ref 0.3–1.2)
Total Protein: 6.6 g/dL (ref 6.0–8.3)

## 2010-06-14 ENCOUNTER — Telehealth: Payer: Self-pay | Admitting: Cardiovascular Disease

## 2010-06-16 ENCOUNTER — Ambulatory Visit (HOSPITAL_COMMUNITY)
Admission: RE | Admit: 2010-06-16 | Discharge: 2010-06-16 | Payer: Self-pay | Source: Home / Self Care | Attending: Oncology | Admitting: Oncology

## 2010-06-27 NOTE — Progress Notes (Signed)
  Recieved Request form [facility] requesting Records, forwarded to [facility] for processing. [name]  June 10, 2009 1:05 PM

## 2010-06-27 NOTE — Progress Notes (Signed)
Summary: Lovenox question  Phone Note Call from Patient   Caller: Patient Summary of Call: Received call from pt who states she was started on Lovenox daily (and ASA stopped) on March 4,2011 by Dr. Gaylyn Rong due to blood clot in leg .  She started 2nd round of chemo on March 4th. Only treatment so far was March 4th. Pt felt very weak, nauseated and dizzy on March 9 and 10. States she did not take Lovenox due to these symptoms. I told pt that these symptoms were probably related to chemo and were not side effects of Lovenox. I stressed importance of continuing Lovenox. Pt states she will have daughter give her Lovenox when she arrives to see pt at 1:00 today. Pt. states she is feeling better today  Pt told to call Dr. Gaylyn Rong to see if he could order something else for nausea.  Pt. also questioning about taking oral blood thinner.   I will call Dr.Ha's office to find out treatment plan  and let them know of pt's call here  today. Initial call taken by: Dossie Arbour, RN, BSN,  August 05, 2009 9:28 AM  Follow-up for Phone Call        Spoke with Cami at Dr. Lodema Pilot office. Notified her of pt's phone call today. Per Cami plan is for pt to be on Lovenox for 3-6 months for DVT.  Dossie Arbour, RN, BSN  August 05, 2009 9:50 AM Talked with pt and told her I had spoken with Dr. Lodema Pilot nurse and that they would call her if labs today showed anemia or any abnormalities that may cause weakness. Pt says blood pressure at cancer center has been good and that she does not have bp cuff to check at home.  Will forward note to Dr. Clifton James. Follow-up by: Dossie Arbour, RN, BSN,  August 05, 2009 10:01 AM     Appended Document: Lovenox question Reviewed. cdm

## 2010-06-27 NOTE — Miscellaneous (Signed)
Summary: Orders Update  Clinical Lists Changes  Orders: Added new Test order of Carotid Duplex (Carotid Duplex) - Signed 

## 2010-06-27 NOTE — Progress Notes (Signed)
Summary: med questions (review w/ CM)  Phone Note Call from Patient Call back at Home Phone [phone]   Caller: Daughter/Lori Reason for Call: Talk to Doctor Summary of Call: has some question on lisinopril Initial call taken by: Migdalia Dk,  June 10, 2009 2:06 PM  Follow-up for Phone Call        I spoke with the pt's daughter. She states the pt has gradually had her lisinopril increased due to elevated bp's. The pt's daughter states that she picked up her mother's refill yesterday/today for her lisinopril. They noticed that the tabs today were yellow instead of pink. The pt's daughter pulled the pt's pill bottles. She states the pt had been on lisinopril 20mg , even when she came in to see Dr. Clifton James, instead of the 40mg  like they thought. The pt did take lisinopril 40mg  for the first time today. The pt does not have a BP monitor at home. I expressed to the pt's daughter to observe for the pt c/o dizziness/lightheadedness, and if she does experience these, they will get her to a BP monitor at [facility] as quick as possible. She is going to try the pt on the 40mg  tabs of lisiniopril this weekend. I explained I would relay this to Dr. Clifton James, and advised if the pt is unable to tolerate the 40mg  tabs, we would probably decrease back to 20mg   once daily. If the pt tolerates 40mg  once daily she may need a repeat BMET. I will leave this to Dr. Sanjuana Kava and we can call the pt's daughter back with recommendations. The pt's daughter is agreeable. Sherri Rad, RN, BSN  June 10, 2009 2:54 PM  Spoke with pt. She is tolerating Lisinopril 40 mg without problems. Per Dr. Clifton James BMP not needed and pt to continue lisinopril 40 mg daily.  Pt. given these instructions and she verbalizes understanding. Follow-up by: Dossie Arbour, RN, BSN,  June 14, 2009 12:07 PM  Additional Follow-up for Phone Call Additional follow up Details #1::        Pat, Can we follow up on this to see how she is  doing? Can we also find out what the current state of her lymphoma work up is? There is a separate phone note about stopping Plavix for biopsy. I left detailed note there and tried pt several times last week but no answer. thanks, cdm Additional Follow-up by: Verne Carrow, MD,  June 13, 2009 1:46 PM    Additional Follow-up for Phone Call Additional follow up Details #2::    I received a call from the pharmacy [facility]) per their policy. They contacted [place] to let us know the pt's daughter had let them know about the pt recieving lisinopril 20mg  when she should have been getting lisinopril 40mg  once daily. I explained to them I had spoken with the pt's daughter and made her aware that it is ok to take the lisinopril 40mg  once daily, but to monitor the pt's bp closely. If she has any symptoms of dizziness/lightheadedness and her pressure is low, we may need to change her back to lisinopril 20mg  once daily. The pharmacist states she explained the same thing to the pt's daughter.  Follow-up by: Sherri Rad, RN, BSN,  June 14, 2009 9:56 AM  Additional Follow-up for Phone Call Additional follow up Details #3:: Details for Additional Follow-up Action Taken: I spoke to Ms. Hogland this am. She has a biopsy planned for tomorrow. She is still on Plavix. She was told this was a "  laser" biospy and plavix was ok. I have told her that I would be glad to discuss the Plavix issue further if it becomes an issue. Please see other phone note in regards to message left with Dr. Lodema Pilot office last week. Dr. Gaylyn Rong out of the country for two weeks.  Additional Follow-up by: Verne Carrow, MD,  June 14, 2009 11:51 AM

## 2010-06-27 NOTE — Letter (Signed)
[facility]   Imported By: Marylou Mccoy 07/11/2009 13:03:42  _____________________________________________________________________  External Attachment:    Type:   Image     Comment:   External Document

## 2010-06-27 NOTE — Letter (Signed)
[facility]   Imported By: Roderic Ovens 12/20/2009 11:14:47  _____________________________________________________________________  External Attachment:    Type:   Image     Comment:   External Document

## 2010-06-27 NOTE — Progress Notes (Signed)
Summary: stop plavix  Phone Note From Other Clinic   Caller: Nurse Desiree Summary of Call: pt needs biopsy and is on plavix can pt stop plavix for this procedure. she needs to be off it for 7 days. pt states she was told she can not stop it. Ofc [id number] Dr Gaylyn Rong nurse Initial call taken by: Edman Circle,  June 08, 2009 12:05 PM  Follow-up for Phone Call        Per Dr Weldon Picking 06/01/2009 note states pt needs to remain on Plavix for 1 yr and if there is need for her to come off for biospy etc. that would need to be discussed further.  Left message for Dr Lodema Pilot nurse of this information and that Dr Sanjuana Kava is working in the hospital today and in the office in the AM if Dr Gaylyn Rong would like to discuss this further. Follow-up by: Charolotte Capuchin, RN,  June 08, 2009 12:36 PM  Additional Follow-up for Phone Call Additional follow up Details #1::        Left message with Dr. Lodema Pilot nurse Cristie Hem. I will be glad to speak further about this issue. I would prefer that she not come off of Plavix (DES 03/20/09) but if she needs a biopsy for definitive diagnosis of lymphoma, then we may have to stop it. I would need to make sure the patient understands the risk of stent thrombosis if coming off of Plavix after three months of therapy. I have asked Dr. Gaylyn Rong to call me back today or tomorrow and we will discuss futher. cdm Additional Follow-up by: Verne Carrow, MD,  June 08, 2009 1:13 PM    Additional Follow-up for Phone Call Additional follow up Details #2::    See phone note from 06/10/09 (add. follow up details #3 ) for additional note regarding this Follow-up by: Dossie Arbour, RN, BSN,  June 16, 2009 11:05 AM

## 2010-06-27 NOTE — Assessment & Plan Note (Signed)
Summary: 3 month rov/sl   Primary Provider:  None   History of Present Illness: 70 yo WF with history of tobacco abuse, HTN, hyperlipidemia, PVD and CAD here today for follow up. She was admitted to [facility] with  inferior STEMI 03/20/09 at which time she was found to have a total occlusion of the proximal RCA at the area of a prior stent. A drug eluting  stent was deployed in the proximal RCA. Her LV gram and echo showed basal to mid inferior and posterior wall hypokinesis with overall preserved EF. During admission she complained of bilateral lower ext pain with ambulation and weakness with ambulation.  We ordered lower extremity arterial dopplers that showed total occlusion of both SFA at the origin with ABI of 0.60 bilaterally. There was also high grade right common femoral artery stenosis. We have decided to continue to treat this medically. She has been noted to have uncontrolled HTN recently and unequal blood pressures in her arms. A CT angiogram of the chest/neck/abdomen was ordered.  This showed stenosis of the left subclavian artery, mildly dilated aortic root, mild disease in bilateral renal arteries. There is diffuse vascular disease including the mesenteric vessels. Unfortunately, the CT scan also showed a right paraspinal soft tissue mass as well as diffuse retroperitoneal and mesenteric adenopathy. She has since been diagnosed with Stage III Non-Hodgkins lymphoma and is undergoing chemotherapy under the guidance of Dr. Jethro Bolus. She has unfortunately also developed a DVT and is on coumadin therapy. She had been on Lovenox as a bridge but this was too expensive to continue long term. Her left leg swelling has totally resolved since she has been treated for the DVT.   She is here today for follow up. Her blood pressure is low today and has been low lately per the pt and daughter. She is feeling weak today.  She has no chest pain or change in her mild baseline dyspnea on exertion. Her energy level is  decreased. The pain in her legs is stable. She has not been walking secondary to weakness.    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 1 Tab Once Daily 2)  Lisinopril 40 Mg Tabs (Lisinopril) .... One Tablet Daily 3)  Magnesium Oxide 400 Mg Tabs (Magnesium Oxide) .Marland Kitchen.. 1 Tab Two Times A Day 4)  Metoprolol Tartrate 50 Mg Tabs (Metoprolol Tartrate) .Marland Kitchen.. 1 Tab Two Times A Day 5)  Nitrostat 0.4 Mg Subl (Nitroglycerin) .Marland Kitchen.. 1 Tablet Under Tongue At Onset of Chest Pain; You May Repeat Every 5 Minutes For Up To 3 Doses. 6)  Crestor 40 Mg Tabs (Rosuvastatin Calcium) .... Take One Tablet By Mouth Daily. 7)  Amlodipine Besylate 10 Mg Tabs (Amlodipine Besylate) .... Take One Tablet By Mouth Daily 8)  Clonidine Hcl 0.1 Mg/24hr Ptwk (Clonidine Hcl) .Marland Kitchen.. 1 Tab Once Daily 9)  Oxycodone-Acetaminophen 5-325 Mg Tabs (Oxycodone-Acetaminophen) .... Take 1-2 Tabs Every 6 Hours As Needed For Pain 10)  Warfarin Sodium 5 Mg Tabs (Warfarin  Sodium) .... As Directed 11)  Prochlorperazine Maleate 10 Mg Tabs (Prochlorperazine Maleate) .... Every 6 Hrs As Needed 12)  Zofran 4 Mg Tabs (Ondansetron Hcl) .... 4 Mg Every 12 Hrs As Needed 13)  Prednisone 10 Mg Tabs (Prednisone) .... 6 Tabs For 5 Days 14)  Allopurinol 300 Mg Tabs (Allopurinol) .... Once Daily  Allergies (verified): No Known Drug Allergies  Past History:  Past Medical History: Seizure d/o AMI, INFERIOR WALL  (ICD-410.40) 10/10 ATHEROSCLEROSIS, NATIVE ARTERY, EXTREMITY, WITH CLAUDICATION (ICD-440.21) CAROTID BRUIT (ICD-785.9), minor blockages bilateral ICA by doppler 11/10 DIASTOLIC DYSFUNCTION (ICD-429.9) DYSLIPIDEMIA (ICD-272.4) THROMBOCYTOPENIA (ICD-287.5) ANEMIA (ICD-285.9) TOBACCO USER (ICD-305.1)-stopped 11/10 ABDOMINAL PAIN (ICD-789.00) Stage III Non-Hodgkins lymphoma-undergoing chemo, Dr. Gaylyn Rong HTN DVT on Coumadin    Social History: Reviewed history from 04/20/2009 and no changes required. She lives in [place].   She is divorced.   She has  one child.   She has smoked for the last 30 years approximately one pack   per day but stopped 11/10..   She denies any use of alcohol.   She  denies use of illicit drugs.   Review of Systems       The patient complains of fatigue, malaise, shortness of breath, muscle weakness, and dizziness.  The patient denies fever, weight gain/loss, vision loss, hoarseness, chest pain, palpitations, prolonged cough, wheezing, sleep apnea, coughing up blood, abdominal pain, blood in stool, nausea, vomiting, diarrhea, heartburn, incontinence, blood in urine, joint pain, leg swelling, rash, skin lesions, headache, fainting, depression, anxiety, enlarged lymph nodes, easy bruising or bleeding, and environmental allergies.    Vital Signs:  Patient profile:   70 year old female Height:      66 inches Weight:      113 pounds BMI:     18.30 Pulse rate:   72 / minute BP sitting:   85 / 52  (left arm) Cuff size:   regular  Vitals Entered By: Oswald Hillock (Sep 27, 2009 10:15 AM)  Physical Exam  General:  General: Thin, cachectic, NAD HEENT: OP clear, mucus membranes moist SKIN: warm, dry Neuro: No focal deficits Psychiatric: Mood and affect normal Neck: No JVD. No thyromegaly, no lymphadenopathy. Lungs:Clear bilaterally, no wheezes, rhonci, crackles CV: RRR no murmurs, gallops rubs Abdomen: soft, NT, ND, BS present Extremities: No edema,  Pedal pulses non-palpable.    Echocardiogram  Procedure date:  06/21/2009  Findings:      Left ventricle: The cavity size was normal. There was mild       concentric hypertrophy. Systolic function was normal. The       estimated ejection fraction was in the range of 55% to 60%. Wall       motion was normal; there were no regional wall motion       abnormalities. Doppler parameters are consistent with abnormal       left ventricular relaxation (grade 1 diastolic dysfunction).     - Aortic valve: Mildly calcified annulus. Trileaflet; mildly       calcified leaflets. There was very mild stenosis. Valve area:       1.15cm 2(VTI). Valve area: 1.34cm 2 (Vmax).     - Mitral valve: Mildly to moderately calcified annulus. Mild       regurgitation.  Arterial Doppler  Procedure date:  06/21/2009  Findings:      Right ABI 0.69. Left ABI 0.60.  Both in moderate range.   Impression & Recommendations:  Problem # 1:  CAD, NATIVE VESSEL (ICD-414.01) Stable. Inferior  STEMI 10/10. Continue Plavix, beta blocker, statin. ASA has been held because she was started on coumadin for the DVT.  LV function normal before chemo started. Repeat echo later this month.   The following medications were removed from the medication list:    Aspirin Ec 325 Mg Tbec (Aspirin) .Marland Kitchen... Take one tablet by mouth daily Her updated medication list for this problem includes:    Plavix 75 Mg Tabs (Clopidogrel bisulfate) .Marland Kitchen... 1 tab once daily    Lisinopril 20 Mg Tabs (Lisinopril) .Marland Kitchen... Take one tablet by mouth daily    Metoprolol Tartrate 50 Mg Tabs (Metoprolol tartrate) .Marland Kitchen... 1 tab two times a day    Nitrostat 0.4 Mg Subl (Nitroglycerin) .Marland Kitchen... 1 tablet under tongue at onset of chest pain; you may repeat every 5 minutes for up to 3 doses.    Amlodipine Besylate 5 Mg Tabs (Amlodipine besylate) .Marland Kitchen... Take one tablet by mouth daily    Warfarin Sodium 5 Mg Tabs (Warfarin sodium) .Marland Kitchen... As directed  Problem # 2:  PVD  (ICD-443.9) Stable. Will treat conservatively while active issues with lymphoma.   Problem # 3:  ESSENTIAL HYPERTENSION, BENIGN (ICD-401.1) BP is low today. Will decrease Lisinopril to 20 mg per day and decrease Norvasc to 5 mg per day.   The following medications were removed from the medication list:    Aspirin Ec 325 Mg Tbec (Aspirin) .Marland Kitchen... Take one tablet by mouth daily Her updated medication list for this problem includes:    Lisinopril 20 Mg Tabs (Lisinopril) .Marland Kitchen... Take one tablet by mouth daily    Metoprolol Tartrate 50 Mg Tabs (Metoprolol tartrate) .Marland Kitchen... 1 tab two times a day    Amlodipine Besylate 5 Mg Tabs (Amlodipine besylate) .Marland Kitchen... Take one tablet by mouth daily    Clonidine Hcl 0.1 Mg/24hr Ptwk (Clonidine hcl) .Marland Kitchen... 1 tab once daily  Patient Instructions: 1)  Your physician recommends that you schedule a follow-up appointment in: 4 months 2)  Your physician has recommended you make the following change in your medication:  3)  Decrease amlodipine to 5 mg by mouth daily. 4)  Decrease lisinopril to 20 mg  by mouth daily Prescriptions: LISINOPRIL 20 MG TABS (LISINOPRIL) Take one tablet by mouth daily  #30 x 11   Entered by:   Dossie Arbour, RN, BSN   Authorized by:   Verne Carrow, MD   Signed by:   Dossie Arbour, RN, BSN on 09/27/2009   Method used:   [facility] to        [facility]  [address] [id number]       [address]       [place], [place]  [address]       Ph: [phone]       Fax: [phone]   RxID:   [id number] AMLODIPINE BESYLATE 5 MG TABS (AMLODIPINE BESYLATE) Take one tablet by mouth daily  #30 x 11   Entered by:   Dossie Arbour, RN, BSN   Authorized by:   Verne Carrow, MD   Signed by:   Dossie Arbour, RN, BSN on 09/27/2009   Method used:   [facility] to        [facility]  [address] [id number]       [address]       [place], [place]  [address]        Ph:  [phone]       Fax: [phone]   RxID:   [id number]

## 2010-06-27 NOTE — Progress Notes (Signed)
[facility]: [facility]   Imported By: Debby Freiberg 11/19/2009 10:05:10  _____________________________________________________________________  External Attachment:    Type:   Image     Comment:   External Document

## 2010-06-27 NOTE — Letter (Signed)
Summary: [facility]  [facility]   Imported By: Lennie Odor 04/10/2010 13:23:44  _____________________________________________________________________  External Attachment:    Type:   Image     Comment:   External Document

## 2010-06-27 NOTE — Assessment & Plan Note (Signed)
Summary: 4 MONTH ROV/SL   Visit Type:  4 months follow up Primary Provider:  None  CC:  Left leg redness.  History of Present Illness: 70 yo WF with history of Non-Hodgkins lymphoma, tobacco abuse, HTN, hyperlipidemia, PVD and CAD here today for follow up. She was admitted to [facility] with  inferior STEMI 03/20/09 at which time she was found to have a total occlusion of the proximal RCA at the area of a prior stent. A drug eluting  stent was deployed in the proximal RCA. Her LV gram and echo showed basal to mid inferior and posterior wall hypokinesis with overall preserved EF. During admission she complained of bilateral lower ext pain with ambulation and weakness with ambulation.  We ordered lower extremity arterial dopplers that showed total occlusion of both SFA at the origin with ABI of 0.60 bilaterally. There was also high grade right common femoral artery stenosis. We have decided to continue to treat this medically. She was noted to have uncontrolled HTN and unequal blood pressures in her arms. A CT angiogram of the chest/neck/abdomen was ordered.  This showed stenosis of the left subclavian artery, mildly dilated aortic root, mild disease in bilateral renal arteries. There is diffuse vascular disease including the mesenteric vessels. Unfortunately, the CT scan also showed a right paraspinal soft tissue mass as well as diffuse retroperitoneal and mesenteric adenopathy. She was then diagnosed with Stage III Non-Hodgkins lymphoma and is undergoing chemotherapy under the guidance of Dr. Jethro Bolus. She has unfortunately also developed a DVT and is on coumadin therapy. Recently, she hit her left leg and developed an ulcer over the anterior aspect of the left leg, just above the ankle. She was admitted to [facility] on September 5th to the IM teaching service for IV antibiotics in treatment of her cellulitis. She was discharged on Sept. 12th but has not done well. The left leg is more swollen and erythematous.  She has not been seen by wound care. She denies fever or chills. She is known to have a total occlusion of the SFA in the left leg at the ostium with reconstitution at the mid thigh.     Current Medications (verified): 1)  Plavix 75 Mg Tabs (Clopidogrel Bisulfate) .Marland Kitchen.. 1 Tab Once Daily 2)  Lisinopril 20 Mg Tabs (Lisinopril) .... Take One Tablet By Mouth Daily 3)  Magnesium Oxide 400 Mg Tabs (Magnesium Oxide) .Marland Kitchen.. 1 Tab Two Times A Day 4)  Metoprolol Tartrate 50 Mg Tabs (Metoprolol Tartrate) .Marland Kitchen.. 1 Tab Two Times A Day 5)  Nitrostat 0.4 Mg Subl (Nitroglycerin) .Marland Kitchen.. 1 Tablet Under Tongue At Onset of Chest Pain; You May Repeat Every 5 Minutes For Up To 3 Doses. 6)  Crestor 40 Mg Tabs (Rosuvastatin Calcium) .... Take One Tablet By Mouth Daily. 7)  Amlodipine Besylate 5 Mg Tabs (Amlodipine Besylate) .... Take One Tablet By Mouth Daily 8)  Clonidine Hcl 0.1 Mg/24hr Ptwk (Clonidine Hcl) .Marland Kitchen.. 1 Tab Once Daily 9)  Oxycodone-Acetaminophen 5-325 Mg Tabs (Oxycodone-Acetaminophen) .... Take 1-2 Tabs Every 6 Hours As Needed For Pain 10)  Warfarin Sodium 5 Mg Tabs (Warfarin Sodium) .... As Directed 11)  Doxycycline Hyclate 100 Mg Tabs (Doxycycline Hyclate) .... Take 1 Tablet By Mouth Two Times A Day 12)  Fluconazole 200 Mg Tabs (Fluconazole) .... 2 Tablets 2 X A Day  Allergies (verified): No Known Drug Allergies  Past History:  Past Medical History: Seizure d/o AMI, INFERIOR WALL (ICD-410.40) 10/10 ATHEROSCLEROSIS, NATIVE ARTERY, EXTREMITY, WITH CLAUDICATION (ICD-440.21) CAROTID BRUIT (  ICD-785.9), minor blockages bilateral ICA by doppler 11/10 DIASTOLIC DYSFUNCTION (ICD-429.9) DYSLIPIDEMIA (ICD-272.4) THROMBOCYTOPENIA (ICD-287.5) ANEMIA (ICD-285.9) TOBACCO USER (ICD-305.1)-stopped 11/10 ABDOMINAL PAIN (ICD-789.00) Stage III Non-Hodgkins lymphoma-undergoing chemo, Dr. Gaylyn Rong HTN DVT on Coumadin Left lower ext cellulitis    Past Surgical History: Reviewed history from 04/20/2009 and no changes  required. Cholecystectomy  Family History: Reviewed history from 04/12/2009 and no changes required.  Her father died of myocardial infarction at the age of 87.   Social History: Reviewed history from 04/20/2009 and no changes required. She lives in [place].   She is divorced.   She has  one child.   She has smoked for the last 30 years approximately one pack   per day but stopped 11/10..   She denies any use of alcohol.   She  denies use of illicit drugs.   Review of Systems  The patient denies fatigue, malaise, fever, weight gain/loss, vision loss, decreased hearing, hoarseness, chest pain, palpitations, shortness of breath, prolonged cough, wheezing, sleep apnea, coughing up blood, abdominal pain, blood in stool, nausea, vomiting, diarrhea, heartburn, incontinence, blood in urine, muscle weakness, joint pain, leg swelling, rash, skin lesions, headache, fainting, dizziness, depression, anxiety, enlarged lymph nodes, easy bruising or bleeding, and environmental allergies.         See HPI. Left leg pain, swelling and redness.   Vital Signs:  Patient profile:   70 year old female Height:      66 inches Weight:      119 pounds BMI:     19.28 Pulse rate:   70 / minute Pulse rhythm:   regular Resp:     18 per minute BP sitting:   90 / 60  (right arm) Cuff size:   large  Vitals Entered By: Vikki Ports (February 10, 2010 2:15 PM)  Physical Exam  General:  General:Thin, cachectic, NAD Musculoskeletal: Muscle strength 5/5 all ext Psychiatric: Mood and affect normal Neck: No JVD, no carotid bruits, no thyromegaly, no lymphadenopathy. Lungs:Clear bilaterally, no wheezes, rhonci, crackles CV: RRR no murmurs, gallops rubs Abdomen: soft, NT, ND, BS present Extremities: Pulses non-palpable bilateral DP/PT. Left leg is swollen, erythema exending halfway up calf with open wound on anterior aspect of left shin with necrotic appearance.     EKG  Procedure date:   02/10/2010  Findings:      NSR, rate 69 bpm. LAFB. LVH.   Impression & Recommendations:  Problem # 1:  CAD, NATIVE VESSEL (ICD-414.01) Stable. Continue current therapy.   Her updated medication list for this problem includes:    Plavix 75 Mg Tabs (Clopidogrel bisulfate) .Marland Kitchen... 1 tab once daily    Lisinopril 20 Mg Tabs (Lisinopril) .Marland Kitchen... Take one tablet by mouth daily    Metoprolol Tartrate 50 Mg Tabs (Metoprolol tartrate) .Marland Kitchen... 1 tab two times a day    Nitrostat 0.4 Mg Subl (Nitroglycerin) .Marland Kitchen... 1 tablet under tongue at onset of chest pain; you may repeat every 5 minutes for up to 3 doses.    Amlodipine Besylate 5 Mg Tabs (Amlodipine besylate) .Marland Kitchen... Take one tablet by mouth daily    Warfarin Sodium 5 Mg Tabs (Warfarin sodium) .Marland Kitchen... As directed  Problem # 2:  PVD (ICD-443.9) Non-healing wound over anterior aspect of left shin with cellulitis. I am concerned that her wound is progressing. I have not seen it before today. She was discharged 4 days ago from the IM service. I have spoken to the IM service. Will have her direct admitted to IM teaching service  for IV antibiotics and potential surgical consultation. With her PV disease, there is a good chance that her wound will not heal without more adequate blood flow. She will likely need a formal angiogram next week to look at options for revascularization.   Patient Instructions: 1)  Your left leg appears to have active,worsening cellulitis. Your wound appears to have a necrotic area. You should go to the Emergency Department for evaluation and admission to the Internal Medicine service. You may need a surgical consultation for debridement and consideration of revascularization given your obstructive arterial disease in the left leg.  2)  Cardiology follow up in three  months.

## 2010-06-27 NOTE — Letter (Signed)
[facility]   Imported By: Marylou Mccoy 12/14/2009 17:07:28  _____________________________________________________________________  External Attachment:    Type:   Image     Comment:   External Document

## 2010-06-27 NOTE — Letter (Signed)
[facility]   Imported By: Roderic Ovens 09/19/2009 16:24:38  _____________________________________________________________________  External Attachment:    Type:   Image     Comment:   External Document

## 2010-06-27 NOTE — Miscellaneous (Signed)
Summary: hospital admission (cellulitis)  INTERNAL MEDICINE ADMISSION HISTORY AND PHYSICAL  PCP: None  ATTENDING: Dr. Maurice March 1st contact: Dr. Earleen Reaper [phone] 2nd contact: Dr. Eben Burow [phone] Nights and weekends: [phone]  CC: Worsening leg swelling and pain.   HPI: 70 yo woman with history of non-hodgkin's lymphoma (on rituxan), HTN, PVD and CAD  who presents today from her cardiologists office due to a worsening necrotic leg lesion. The patient was recently discharged from the teaching service at [facility] on Sept. 12th.  At that time the patient was being treated for left lower extremity cellulitis and was discharged on doxycycline.  Of note, the patient's hospital course was complicated by candida albicans fungemia for which she was treated with fluconazole.  At discharge, the patient's leg erythema appeard to be improving as was her ability to bear weight.  There was a small necrotic lesion at that time which was beginning to heal.  Since her discharge, the patient has been taking fluconazole and doxycycline as prescribed and was to continue until her appt with Dr. Daiva Eves.  Over the last few days, the patient has noted increased discharge of yellow fluid, but denies any pus or bloody drainage.  The patient also states that yesterday she spend a significant amount of time on her feet washing dishes and noted an acute worsening of her symptoms this morning.  This AM, the patient developed worsening edema of the left foot as well as a darkened coloration.  The patient also noticed an increase in the size of the necrotic lesion.  The patient has experienced some increase in the pain associated with the lesion which is described as intermittent and initially a pin prick sensation that becomes throbbing.  The patients current pain is a 3-4/10 and she feels that it is well controlled on percocet. The patient currently denies any SOB, chills, fever, nausea or vomiting.   ALLERGIES: NKDA   PAST MEDICAL  HISTORY: Past Medical History: Seizure d/o AMI, INFERIOR WALL (ICD-410.40) 10/10 ATHEROSCLEROSIS, NATIVE ARTERY, EXTREMITY, WITH CLAUDICATION (ICD-440.21) CAROTID BRUIT (ICD-785.9), minor blockages bilateral ICA by doppler 11/10 DIASTOLIC DYSFUNCTION (ICD-429.9) DYSLIPIDEMIA (ICD-272.4) THROMBOCYTOPENIA (ICD-287.5) ANEMIA (ICD-285.9) TOBACCO USER (ICD-305.1)-stopped 11/10 ABDOMINAL PAIN (ICD-789.00) Stage III Non-Hodgkin lymphoma-undergoing chemo, Dr. Gaylyn Rong HTN DVT on Coumadin    MEDICATIONS:  PLAVIX 75 MG TABS (CLOPIDOGREL BISULFATE) 1 tab once daily LISINOPRIL 20 MG TABS (LISINOPRIL) Take one tablet by mouth daily MAGNESIUM OXIDE 400 MG TABS (MAGNESIUM OXIDE) 1 tab two times a day METOPROLOL TARTRATE 50 MG TABS (METOPROLOL TARTRATE) 1 tab two times a day NITROSTAT 0.4 MG SUBL (NITROGLYCERIN) 1 tablet under tongue at onset of chest pain; you may repeat every 5 minutes for up to 3 doses. CRESTOR 40 MG TABS (ROSUVASTATIN CALCIUM) Take one tablet by mouth daily. AMLODIPINE BESYLATE 5 MG TABS (AMLODIPINE BESYLATE) Take one tablet by mouth daily CLONIDINE HCL 0.1 MG/24HR PTWK (CLONIDINE HCL) 1 tab once daily OXYCODONE-ACETAMINOPHEN 5-325 MG TABS (OXYCODONE-ACETAMINOPHEN) take 1-2 tabs every 4 hours as needed for pain WARFARIN SODIUM 5 MG TABS (WARFARIN SODIUM) as directed DOXYCYCLINE HYCLATE 100 MG TABS (DOXYCYCLINE HYCLATE) Take 1 tablet by mouth two times a day FLUCONAZOLE 200 MG TABS (FLUCONAZOLE) 2 tablets 2 x a day   SOCIAL HISTORY: She lives in [place].   She is divorced.   She has  one child.   She has smoked for the last 30 years approximately one pack   per day but stopped 11/10..   She denies any use of alcohol.  She  denies use of illicit drugs.     FAMILY HISTORY Her father died of myocardial infarction at the age of 92.  Brother Died of Lung CA age 65.  ROS: Negative as per HPI.  VITALS: T: 97.9  P: 115 BP:115/75  R:18  O2SAT:95%  ON: RA  PHYSICAL  EXAM: General:  alert, well-developed, and cooperative to examination.   Head:  normocephalic and atraumatic.   Eyes:  vision grossly intact, pupils equal, pupils round, pupils reactive to light, no injection and anicteric.   Mouth:  pharynx pink and moist, no erythema, and no exudates.   Neck:  supple, full ROM, no thyromegaly, no JVD, and no carotid bruits.   Lungs:  normal respiratory effort, no accessory muscle use, normal breath sounds, no crackles, and no wheezes.  Heart:  normal rate, regular rhythm, 3/6 systolic murmur heard best at the apex, no gallop, and no rub.   Abdomen:  soft, non-tender, normal bowel sounds, no distention, no guarding, no rebound tenderness, no hepatomegaly, and no splenomegaly.   Pulses:  2+ radial pulses bilaterally with apparent absent dp/pt pulses bilaterally.  Extremities:  On the left leg there is a 5 cm x 3 cm necrotic lesion with healing skin in periphery.  The leg has circumferential erythema approximately to 1/3 tibia. The foot is edematous and warm as well as violaceous. There is some mild skin flaking between the toes but without skin breakdown. The left foot is slightly erythematous with some mild erythema.  The left foot is cool to the touch.  Neurologic:  alert & oriented X3, cranial nerves II-XII intact, strength normal in all extremities, sensation intact to light touch, and gait normal.   Skin:  turgor normal and no rashes.   Psych:  Oriented X3, memory intact for recent and remote, normally interactive, good eye contact, not anxious appearing, and not depressed  appearing.   LABS: None at present.   ASSESSMENT AND PLAN:  This is a 70 year old female with a history of non-Hodgkin's lymphoma on Rituxan, PVD, and CAD who presents after recent hospitalization for cellulitis with worsening lower extremity edema and pain.   1) Right lower leg swelling: The differential diagnosis includes cellulitis, fasciitis, abscess, osteomyelitis, and  lymphangitis. the patient was imperially treated with caspofungin, diflucan, vancomycin at last admission, and blood cultures grew out candida albicans. The patients antibiotic/antifungal regimen was narrowed to fluconazole and doxycycline.  Given her recurrence we will initiate IV fluconazole as well imimenem and vancomycin.  Though the patient is afebrile, we will check blood cultures as well as a wound culture. An MRI with and without contrast will also be performed to help narrow the DDX.  Will get wound care consult as well as surgical and ID consults. Will hold plavix and coumadin for now in case surgical intervention is needed.   3) Supratheraputic INR: 9/12 INR was 3.28.  This was felt to be the result of fluconizole use.  Will recheck INR today and hold coumadin for now.   2) HX of candida albicans fungemia at last admission:  Pt will be imperially treated with IV fluconazole, although she does not appear to be septic at this time.  Will also check CBC and monitor white count closely.   3)HTN: Patient will be maintained on home dose for now and BP's remained stable throughout her last admission on her home doses of lisinopril and metoprolol.   4) CAD sp MI and stenting: Pt currently denies any chest  pain or SOB.  Pt is followed by Dr. Clifton James.  5) Non-Hodgkin lymphoma: Pt is followed by Dr. Gaylyn Rong, and receives Rituxan every 2 months.   6) VTE PROPH: lovenox    I have seen and examined the patient. I reviewed the resident/fellow note and agree with the findings and plan of care as documented. My additions and revisions are included.    Name:  Date:  [Prescriptions]

## 2010-06-27 NOTE — Progress Notes (Signed)
Summary: daughter needs to know which meds are hypertention meds  Phone Note Call from Patient Call back at [phone]   Caller: Daughter / [name] Reason for Call: Talk to Nurse, Talk to Doctor Summary of Call: pt needs to be off her hypertention meds the day she takes chemo and the daughter has a list of patients meds with her and she is at work and will be there until 5pm so she wants a call for someone to go over the meds and tell her which one's are hypertention meds Initial call taken by: Omer Jack,  July 05, 2009 12:42 PM  Follow-up for Phone Call        Spoke with [name] and told her that lisinopril, clonidine, metoprolol and amlodipine could all affect blood pressure. Follow-up by: Dossie Arbour, RN, BSN,  July 05, 2009 1:04 PM

## 2010-06-27 NOTE — Progress Notes (Signed)
Summary: pt needs to schedule appt   Phone Note Call from Patient Call back at [phone]   Caller: Daughter/Lori Reason for Call: Talk to Nurse, Talk to Doctor Summary of Call: Mom got a call about setting her up for an appt and daughter was calling to set that up Initial call taken by: Omer Jack,  April 06, 2010 1:02 PM  Follow-up for Phone Call        Needed to sch. carotid duplex. I have sch. for 12/9 @ 2pm. She will f/u with [facility] after her carotid duplex. Whitney Maeola Sarah RN  April 06, 2010 4:06 PM  Follow-up by: Whitney Maeola Sarah RN,  April 06, 2010 3:57 PM

## 2010-06-27 NOTE — Letter (Signed)
[facility]   Imported By: Roderic Ovens 08/16/2009 16:04:44  _____________________________________________________________________  External Attachment:    Type:   Image     Comment:   External Document

## 2010-06-27 NOTE — Consult Note (Signed)
Summary: [facility]   [facility]   Imported By: Roderic Ovens 03/14/2010 14:43:28  _____________________________________________________________________  External Attachment:    Type:   Image     Comment:   External Document

## 2010-06-27 NOTE — Letter (Signed)
[facility]   Imported By: Marylou Mccoy 11/24/2009 12:52:01  _____________________________________________________________________  External Attachment:    Type:   Image     Comment:   External Document

## 2010-06-27 NOTE — Letter (Signed)
Summary: [facility]   Imported By: Marylou Mccoy 11/18/2009 16:37:49  _____________________________________________________________________  External Attachment:    Type:   Image     Comment:   External Document

## 2010-06-27 NOTE — Progress Notes (Signed)
Summary: pt request call  Phone Note Call from Patient Call back at Home Phone [phone]   Caller: Patient Reason for Call: Talk to Nurse Initial call taken by: Judie Grieve,  April 24, 2010 9:04 AM  Follow-up for Phone Call        [email or web address]  Pt. needed Crestor samples. I left them @ the front desk. Whitney Maeola Sarah RN  April 24, 2010 10:52 AM  Follow-up by: Claris Gladden RN,  April 24, 2010 9:53 AM

## 2010-06-27 NOTE — Miscellaneous (Signed)
Summary: Orders Update  Clinical Lists Changes  Orders: Added new Test order of Arterial Duplex Lower Extremity (Arterial Duplex Low) - Signed 

## 2010-06-27 NOTE — Letter (Signed)
Summary: [facility]  [facility]   Imported By: Marylou Mccoy 03/02/2010 10:25:52  _____________________________________________________________________  External Attachment:    Type:   Image     Comment:   External Document

## 2010-06-27 NOTE — Assessment & Plan Note (Signed)
Summary: f/u cta@ 4:15 on 05-24-09   Visit Type:  Follow-up Primary Provider:  None  CC:  sob w/activity.Marland Kitchenedema/abdomen....  History of Present Illness: 70 yo WF with history of tobacco abuse, HTN, hyperlipidemia, PVD and CAD here today for hospital follow up after recent admission for inferior STEMI 03/20/09 at which time she was found to have a total occlusion of the proximal RCA at the area of a prior stent. A drug eluting  stent was deployed in the proximal RCA. Her LV gram and echo showed basal to mid inferior and posterior wall hypokinesis with overall preserved EF. During admission she complained of bilateral lower ext pain with ambulation and weakness with ambulation. It is not clear if she had a prior workup for PVD. We ordered lower extremity arterial dopplers that showed total occlusion of both SFA at the origin with ABI of 0.60 bilaterally. There was also high grade right common femoral artery stenosis. We have decided to continue to treat this medically. She has been noted to have uncontrolled HTN recently and unequal blood pressures in her arms. A CT angiogram of the chest/neck/abdomen was ordered.  This showed stenosis of the left subclavian artery, mildly dilated aortic root, mild disease in bilateral renal arteries. There is diffuse vascular disease including the mesenteric vessels. Unfortunately, the CT scan also showed a right paraspinal soft tissue mass as well as diffuse retroperitoneal and mesenteric adenopathy. She has been seen by Dr. Gaylyn Rong and had a bone marrow biopsy yesterday. This is suspicious for lymphoma.  She is here today for follow up. Her blood pressure is much better controlled on current therapy. Clonidine was added to her regimen last week. She has no chest pain or change in her mild baseline dyspnea on exertion. She feels fatigued but no other specific complaints.   Current Medications (verified): 1)  Aspirin Ec 325 Mg Tbec (Aspirin) .... Take One Tablet By Mouth  Daily 2)  Plavix 75 Mg Tabs (Clopidogrel Bisulfate) .Marland Kitchen.. 1 Tab Once Daily 3)  Lisinopril 40 Mg Tabs (Lisinopril) .... One Tablet Daily 4)  Magnesium Oxide 400 Mg Tabs (Magnesium Oxide) .Marland Kitchen.. 1 Tab Two Times A Day 5)  Metoprolol Tartrate 50 Mg Tabs (Metoprolol Tartrate) .Marland Kitchen.. 1 Tab Two Times A Day 6)  Nitrostat 0.4 Mg Subl (Nitroglycerin) .Marland Kitchen.. 1 Tablet Under Tongue At Onset of Chest Pain; You May Repeat Every 5 Minutes For Up To 3 Doses. 7)  Crestor 40 Mg Tabs (Rosuvastatin Calcium) .... Take One Tablet By Mouth Daily. 8)  Amlodipine Besylate 10 Mg Tabs (Amlodipine Besylate) .... Take One Tablet By Mouth Daily 9)  Clonidine Hcl 0.1 Mg/24hr Ptwk (Clonidine Hcl) .Marland Kitchen.. 1 Tab Once Daily  Allergies (verified): No Known Drug Allergies  Past History:  Past Medical History: Seizure d/o AMI, INFERIOR WALL (ICD-410.40) 10/10 ATHEROSCLEROSIS, NATIVE ARTERY, EXTREMITY, WITH CLAUDICATION (ICD-440.21) CAROTID BRUIT (ICD-785.9), minor blockages bilateral ICA by doppler 11/10 DIASTOLIC DYSFUNCTION (ICD-429.9) DYSLIPIDEMIA (ICD-272.4) THROMBOCYTOPENIA (ICD-287.5) ANEMIA (ICD-285.9) TOBACCO USER (ICD-305.1)-stopped 11/10 ABDOMINAL PAIN (ICD-789.00) Diffuse adenopathy and paraspinal mass concerning for lymphoma-workup pending.  HTN    Social History: Reviewed history from 04/20/2009 and no changes required. She lives in [place].   She is divorced.   She has  one child.   She has smoked for the last 30 years approximately one pack   per day but stopped 11/10..   She denies any use of alcohol.   She  denies use of illicit drugs.   Review of Systems  The patient complains of fatigue and malaise.  The patient denies fever, weight gain/loss, vision loss, decreased hearing, hoarseness, chest pain, palpitations, prolonged cough, wheezing, sleep apnea, coughing up blood, abdominal pain, blood in stool, nausea, vomiting, diarrhea, heartburn, incontinence, blood in urine, muscle weakness, joint  pain, leg swelling, rash, skin lesions, headache, fainting, dizziness, depression, anxiety, enlarged lymph nodes, easy bruising or bleeding, and environmental allergies.    Vital Signs:  Patient profile:   70 year old female Height:      66 inches Weight:      123 pounds BMI:     19.92 Pulse rate:   72 / minute Pulse rhythm:   regular BP sitting:   116 / 70  (right arm) Cuff size:   regular  Vitals Entered By: Danielle Rankin, CMA (June 01, 2009 3:59 PM)  Physical Exam  General:  General: Well developed, well nourished, NAD HEENT: OP clear, mucus membranes moist Psychiatric: Mood and affect normal Neck: No JVD Lungs:Clear bilaterally, no wheezes, rhonci, crackles CV: RRR no murmurs, gallops rubs Abdomen: soft, NT, ND, BS present Extremities: No edema, bilateral pedal pulses difficult to palpate      CT Scan  Procedure date:  05/24/2009  Findings:      1.  Ostial stenosis at the origin of the left subclavian artery placing the patient at risk for subclavian steal syndrome. Correlate clinically. 2.  Aortic atherosclerosis without focal aneurysm or evidence of dissection. Ascending aorta 4.2cm 3.  Right paraspinal soft tissue mass suspicious for lymphoma/leukemia.  4. Diffuse aortic atherosclerosis with involvement of the proximal visceral arteries.  There is mild diffuse renal artery stenosis bilaterally which is probably atherosclerotic as well. 5.  Extensive retroperitoneal and mesenteric lymphadenopathy suspicious for lymphoma.  There is resulting extrinsic mass effect on the renal veins and IVC. 6.  No significant solid visceral organ findings.  Mild biliary dilatation may be physiologic status post cholecystectomy.  Impression & Recommendations:  Problem # 1:  CAD, NATIVE VESSEL (ICD-414.01) Stable. Continue current meds.   Her updated medication list for this problem includes:    Aspirin Ec 325 Mg Tbec (Aspirin) .Marland Kitchen... Take one tablet by mouth daily     Plavix 75 Mg Tabs (Clopidogrel bisulfate) .Marland Kitchen... 1 tab once daily    Lisinopril 40 Mg Tabs (Lisinopril) ..... One tablet daily    Metoprolol Tartrate 50 Mg Tabs (Metoprolol tartrate) .Marland Kitchen... 1 tab two times a day    Nitrostat 0.4 Mg Subl (Nitroglycerin) .Marland Kitchen... 1 tablet under tongue at onset of chest pain; you may repeat every 5 minutes for up to 3 doses.    Amlodipine Besylate 10 Mg Tabs (Amlodipine besylate) .Marland Kitchen... Take one tablet by mouth daily  Problem # 2:  PVD (ICD-443.9) Left subclavian stenosis which is not symptomatic at this time. Will address once pt's workup for lymphoma completed. She has known bilateral superficial femoral artery occlusions with stable claudication. Repeat ABI 6 months. No significant carotid artery stenosis.   Problem # 3:  ESSENTIAL HYPERTENSION, BENIGN (ICD-401.1) Much better controlled on current therapy. No severe renal artery stenosis by CTA.   Her updated medication list for this problem includes:    Aspirin Ec 325 Mg Tbec (Aspirin) .Marland Kitchen... Take one tablet by mouth daily    Lisinopril 40 Mg Tabs (Lisinopril) ..... One tablet daily    Metoprolol Tartrate 50 Mg Tabs (Metoprolol tartrate) .Marland Kitchen... 1 tab two times a day    Amlodipine Besylate 10 Mg Tabs (Amlodipine besylate) .Marland Kitchen... Take one  tablet by mouth daily    Clonidine Hcl 0.1 Mg/24hr Ptwk (Clonidine hcl) .Marland Kitchen... 1 tab once daily  Problem # 4:  LYMPHADENOPATHY (ICD-785.6) Workup per Dr. Gaylyn Rong. If Plavix needs to be held prior to further biopsy or surgery, we will need to discuss further with patient. She and I talked about this today at length. The risk of stent thrombosis is higher if Plavix is stopped within one year of stent placement.   Problem # 5:  DIASTOLIC DYSFUNCTION (ICD-429.9) Volume status ok.   Patient Instructions: 1)  Your physician recommends that you schedule a follow-up appointment in: 3 months 2)  Your physician recommends that you continue on your current medications as directed. Please refer to  the Current Medication list given to you today.

## 2010-06-27 NOTE — Progress Notes (Signed)
Summary: can she do chemo  Phone Note Call from Patient Call back at Home Phone [phone]   Caller: Daughter Reason for Call: Talk to Nurse, Talk to Doctor Summary of Call: Daughter wanted to discuss if Mom's heart take going through chemo Initial call taken by: Omer Jack,  June 20, 2009 4:07 PM  Follow-up for Phone Call        Spoke with pt's daughter, [name], who states she received call today from Dr. Gaylyn Rong that pt has  lymphoma and will need chemotherapy.  Pt.'s appt with Dr. Gaylyn Rong is 06/30/09.  Daughter questioning if pt can tolerate chemotherapy from a cardiac standpoint.  Daughter states Dr. Gaylyn Rong said pt would need echo.  I told daughter that Dr. Clifton James was not in office this afternoon but will be here in AM and that I would send this information to him and call her back tomorrow. Daughter agrees with this plan and can be reached after 9AM tomorrow at work.  Number is [phone].  Follow-up by: Dossie Arbour, RN, BSN,  June 20, 2009 4:24 PM  Additional Follow-up for Phone Call Additional follow up Details #1::        [name] with [facility] called today to find out if it would be ok to stop plavix for 7 days prior to inserting porta cath.  Pt instructed IR that she was not to stop plavix until next year.  IR would like to bridge pt with lovenox if plavix cannot be stopped.  Procedure scheduled for 06/28/2009.   [name] in [place]: [phone] Additional Follow-up by: Charlena Cross, RN, BSN,  June 20, 2009 4:28 PM    Additional Follow-up for Phone Call Additional follow up Details #2::    Per request by Dr. Clifton James pt scheduled for echo on 06/21/09 at 2 PM Dossie Arbour, RN, BSN  June 20, 2009 4:55 PM I called pt's daughter as I had told her yesterday I would call her back today.  Daughter reports pt is having increased pain in left leg which occurs at times during exertion but also during rest (wakes her up at night) and that left foot is swollen.  Pt almost out of  pain meds prescribed at ER visit and is wondering about how to get a refill for this.  Pt does not have primary care provider. Will discuss with Dr. Rayetta Humphrey, RN, BSN  June 21, 2009 12:22 PM Per Dr. Clifton James pt to have ABI's today if possible.  Test ordered and confirmed with [name] that it can be done today. Dossie Arbour, RN, BSN  June 21, 2009 12:54 PM Dr. Clifton James reviewed ABI's and echo done today with pt and daughter and answered all questions while they were in office today for tests. Prescription for pain medicine give as below Follow-up by: Dossie Arbour, RN, BSN,  June 21, 2009 4:10 PM  Additional Follow-up for Phone Call Additional follow up Details #3:: Details for Additional Follow-up Action Taken: Will review echo today and call pt with results. I spoke to [facility] and they will place Portacath with her on Plavix. cdm Additional Follow-up by: Verne Carrow, MD,  June 21, 2009 11:23 AM  New/Updated Medications: OXYCODONE-ACETAMINOPHEN 5-325 MG TABS (OXYCODONE-ACETAMINOPHEN) take 1-2 tabs every 6 hours as needed for pain Prescriptions: OXYCODONE-ACETAMINOPHEN 5-325 MG TABS (OXYCODONE-ACETAMINOPHEN) take 1-2 tabs every 6 hours as needed for pain  #30 x 0   Entered by:   Dossie Arbour, RN, BSN   Authorized  by:   Verne Carrow, MD   Signed by:   Dossie Arbour, RN, BSN on 06/21/2009   Method used:   Print then Give to Patient   RxID:   [id number]

## 2010-06-27 NOTE — Letter (Signed)
Summary: Outpatient Coinsurance Notice  Outpatient Coinsurance Notice   Imported By: Marylou Mccoy 10/18/2009 09:25:57  _____________________________________________________________________  External Attachment:    Type:   Image     Comment:   External Document

## 2010-06-27 NOTE — Letter (Signed)
Summary: [facility]   Imported By: Roderic Ovens 09/19/2009 14:54:55  _____________________________________________________________________  External Attachment:    Type:   Image     Comment:   External Document

## 2010-06-27 NOTE — Progress Notes (Signed)
Summary: [facility]  [facility]   Imported By: Debby Freiberg 11/19/2009 10:12:09  _____________________________________________________________________  External Attachment:    Type:   Image     Comment:   External Document

## 2010-06-27 NOTE — Discharge Summary (Signed)
Summary: Hospital Discharge Update    Hospital Discharge Update:  Date of Admission: 02/10/2010 Date of Discharge: 02/23/2010  Brief Summary:  [name] was admitted for the following: 1. nonhealing LLE ulcer and ischemic great toe. She is now s/p left fem pop bypass on 9/21. She is being followed by Dr Imogene Burn ([facility]) and Dr Noelle Penner (Plastics). 2. NHL for which she is being f/ed by [facility]. She gets Rituxan q16months. 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 Was also treated with broad spectrum IV abx for presumed cellulitis. She was followed by Dr. Daiva Eves 4. CAD/MI s/p stent - stable from a cardiac standpoint throughout. We cont her Plavix, Lisinopril and Metoprolol 5. HTN - her pressures tend to run on the soft side. She's on Lisinopril 5mg  and Metoprolol 50mg  two times a day, which we continued throughout her hospitalization 6. 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  Labs needed at follow-up: PT/INR  Other follow-up issues:  Pls arrange for her to get her INR checked periodically.  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) - Signed Added new problem of History of  CANDIDA ALBICANS FUNGEMIA (ICD-112.1) - Signed Changed problem from CAD, NATIVE VESSEL (ICD-414.01) to CAD, NATIVE VESSEL (ICD-414.01) - Signed  Medication list changes:  Changed medication from LISINOPRIL 20 MG TABS (LISINOPRIL) Take one tablet by mouth daily to LISINOPRIL 5 MG TABS (LISINOPRIL) - Signed Removed medication of AMLODIPINE BESYLATE 5 MG TABS (AMLODIPINE BESYLATE) Take one tablet by mouth daily - Signed Removed medication of CLONIDINE HCL 0.1 MG/24HR PTWK  (CLONIDINE HCL) 1 tab once daily - Signed Removed medication of OXYCODONE-ACETAMINOPHEN 5-325 MG TABS (OXYCODONE-ACETAMINOPHEN) take 1-2 tabs every 6 hours as needed for pain - Signed Changed medication from WARFARIN SODIUM 5 MG TABS (WARFARIN SODIUM) as directed to COUMADIN 2 MG TABS (WARFARIN SODIUM) take 1/2 tablet daily at bedtime - Signed Removed medication of DOXYCYCLINE HYCLATE 100 MG TABS (DOXYCYCLINE HYCLATE) Take 1 tablet by mouth two times a day - Signed Removed medication of FLUCONAZOLE 200 MG TABS (FLUCONAZOLE) 2 tablets 2 x a day - Signed Added new medication of SANTYL 250 UNIT/GM OINT (COLLAGENASE) 1 application topically daily with daily wound dressing changes - Signed Added new medication of COLACE 100 MG CAPS (DOCUSATE SODIUM) twice daily for constipation - Signed Added new medication of MIRALAX  POWD (POLYETHYLENE GLYCOL 3350) - Signed Added new medication of TRAMADOL HCL 50 MG TABS (TRAMADOL HCL) take by mouth every 6 hrs as needed pain - Signed Added new medication of OXYCODONE-ACETAMINOPHEN 5-325 MG TABS (OXYCODONE-ACETAMINOPHEN) take one tablet by mouth every 4 hours as needed pain - Signed Added new medication of DULCOLAX 10 MG SUPP (BISACODYL) use as needed for constipation - Signed Added new medication of BIOTENE/CALCIUM  LIQD (MOUTHWASHES) rinse two times a day - Signed Rx of TRAMADOL HCL 50 MG TABS (TRAMADOL HCL) take by mouth every 6 hrs as needed pain;  #60 x 3;  Signed;  Entered by: Jaci Lazier MD;  Authorized by: Jaci Lazier MD;  Method used: Electronically to [facility] [phone] [place], [place], [place]  [address], Ph: [phone], Fax: [phone]  The medication,  problem, and allergy lists have been updated.  Please see the dictated discharge summary for details.  Discharge medications:  PLAVIX 75 MG TABS (CLOPIDOGREL BISULFATE) 1 tab once daily LISINOPRIL 5 MG TABS (LISINOPRIL)  MAGNESIUM OXIDE 400 MG TABS (MAGNESIUM OXIDE) 1 tab  two times a day METOPROLOL TARTRATE 50 MG TABS (METOPROLOL TARTRATE) 1 tab two times a day NITROSTAT 0.4 MG SUBL (NITROGLYCERIN) 1 tablet under tongue at onset of chest pain; you may repeat every 5 minutes for up to 3 doses. CRESTOR 40 MG TABS (ROSUVASTATIN CALCIUM) Take one tablet by mouth daily. COUMADIN 2 MG TABS (WARFARIN SODIUM) take 1/2 tablet daily at bedtime SANTYL 250 UNIT/GM OINT (COLLAGENASE) 1 application topically daily with daily wound dressing changes COLACE 100 MG CAPS (DOCUSATE SODIUM) twice daily for constipation MIRALAX  POWD (POLYETHYLENE GLYCOL 3350)  TRAMADOL HCL 50 MG TABS (TRAMADOL HCL) take by mouth every 6 hrs as needed pain OXYCODONE-ACETAMINOPHEN 5-325 MG TABS (OXYCODONE-ACETAMINOPHEN) take one tablet by mouth every 4 hours as needed pain DULCOLAX 10 MG SUPP (BISACODYL) use as needed for constipation BIOTENE/CALCIUM  LIQD (MOUTHWASHES) rinse two times a day  Other patient instructions:  Pls f/u with Dr. Imogene Burn and Dr. Noelle Penner as instructed. Pls f/u with [place] and call if you have any problems.

## 2010-06-29 NOTE — Progress Notes (Signed)
Summary: questions about medications  Phone Note Call from Patient Call back at [phone]   Caller: Daughter/ Lawson Fiscal Summary of Call: Pt daughter have question about medications Initial call taken by: Judie Grieve,  June 14, 2010 10:13 AM  Follow-up for Phone Call        Patient's oncologist took her off of Coumadin and her daughter was wondering if we should start her on Aspirin. We will start her on a baby Aspirin. Whitney Maeola Sarah RN  June 14, 2010 10:38 AM  Follow-up by: Whitney Maeola Sarah RN,  June 14, 2010 10:38 AM    New/Updated Medications: ASPIRIN 81 MG TBEC (ASPIRIN) Take one tablet by mouth daily

## 2010-06-29 NOTE — Assessment & Plan Note (Signed)
Summary: f73m/wpa   Visit Type:  Follow-up Primary Provider:  None  CC:  no cardiac complaints.  History of Present Illness: 70 yo WF with history of Non-Hodgkins lymphoma, tobacco abuse, HTN, hyperlipidemia, PAD and CAD here today for follow up. She was admitted to [facility] with  inferior STEMI 03/20/09 at which time she was found to have a total occlusion of the proximal RCA at the area of a prior stent. A drug eluting  stent was deployed in the proximal RCA. Her LV gram and echo showed basal to mid inferior and posterior wall hypokinesis with overall preserved EF. During admission she complained of bilateral lower ext pain with ambulation and weakness with ambulation.  We ordered lower extremity arterial dopplers that showed total occlusion of both SFA at the origin with ABI of 0.60 bilaterally. There was also high grade right common femoral artery stenosis. We have decided to continue to treat this medically. She was noted to have uncontrolled HTN and unequal blood pressures in her arms. A CT angiogram of the chest/neck/abdomen was ordered.  This showed stenosis of the left subclavian artery, mildly dilated aortic root, mild disease in bilateral renal arteries. There is diffuse vascular disease including the mesenteric vessels. Unfortunately, the CT scan also showed a right paraspinal soft tissue mass as well as diffuse retroperitoneal and mesenteric adenopathy. She was then diagnosed with Stage III Non-Hodgkins lymphoma and is undergoing chemotherapy under the guidance of Dr. Jethro Bolus. She has unfortunately also developed a DVT and is on coumadin therapy. This fall,  she hit her left leg and developed an ulcer over the anterior aspect of the left leg, just above the ankle. She was unable to heal this area. Dr. Imogene Burn performed left common femoral artery to left popliteal artery bypass on 02/15/10. She then had her left great toe ampuated on 03/14/10 by Dr. Imogene Burn. This has healed well. She feels very well. Her  lymphoma is in remission. No chest pain or SOB.      Current Medications (verified): 1)  Plavix 75 Mg Tabs (Clopidogrel Bisulfate) .Marland Kitchen.. 1 Tab Once Daily 2)  Magnesium Oxide 400 Mg Tabs (Magnesium Oxide) .Marland Kitchen.. 1 Tab Two Times A Day 3)  Metoprolol Tartrate 50 Mg Tabs (Metoprolol Tartrate) .Marland Kitchen.. 1 Tab Two Times A Day 4)  Nitrostat 0.4 Mg Subl (Nitroglycerin) .Marland Kitchen.. 1 Tablet Under Tongue At Onset of Chest Pain; You May Repeat Every 5 Minutes For Up To 3 Doses. 5)  Crestor 40 Mg Tabs (Rosuvastatin Calcium) .... Take One Tablet By Mouth Daily. 6)  Coumadin 2 Mg Tabs (Warfarin Sodium) .... Take 1/2 Tablet Daily At Bedtime 7)  Santyl 250 Unit/gm Oint (Collagenase) .Marland Kitchen.. 1 Application Topically Daily With Daily Wound Dressing Changes 8)  Colace 100 Mg Caps (Docusate Sodium) .... Twice Daily For Constipation 9)  Miralax  Powd (Polyethylene Glycol 3350) 10)  Tramadol Hcl 50 Mg Tabs (Tramadol Hcl) .... Take By Mouth Every 6 Hrs As Needed Pain 11)  Oxycodone-Acetaminophen 5-325 Mg Tabs (Oxycodone-Acetaminophen) .... Take One Tablet By Mouth Every 4 Hours As Needed Pain 12)  Dulcolax 10 Mg Supp (Bisacodyl) .... Use As Needed For Constipation 13)  Biotene/calcium  Liqd (Mouthwashes) .... Rinse Two Times A Day 14)  Multivitamins  Caps (Multiple Vitamin) .... Take One Tablet By Mouth Daily.  Allergies (verified): No Known Drug Allergies  Past History:  Past Medical History: Seizure d/o AMI, INFERIOR WALL (ICD-410.40) 10/10 PAD now s/p left common femoral to left popliteal bypass 9/11. 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 Gaylyn Rong HTN DVT on Coumadin Left lower ext cellulitis-resolving    Past Surgical History: Cholecystectomy Left great toe  amputation 10/11. Left common femoral artery to popliteal bypass 9/11.     Social History: Reviewed history from 04/20/2009 and no changes required. She lives in [place].   She is divorced.   She has  one child.   She has smoked for the last 30 years approximately one pack   per day but stopped 11/10..   She denies any use of alcohol.   She  denies use of illicit drugs.   Review of Systems  The patient denies fatigue, malaise, fever, weight gain/loss, vision loss, decreased hearing, hoarseness, chest pain, palpitations, shortness of breath, prolonged cough, wheezing, sleep apnea, coughing up blood, abdominal pain, blood in stool, nausea, vomiting, diarrhea, heartburn, incontinence, blood in urine, muscle weakness, joint pain, leg swelling, rash, skin lesions, headache, fainting, dizziness, depression, anxiety, enlarged lymph nodes, easy bruising or bleeding, and environmental allergies.    Vital Signs:  Patient profile:   70 year old female Height:      66 inches Weight:      122 pounds Pulse rate:   70 / minute Resp:     14 per minute BP sitting:   138 / 52  (right arm) Cuff size:   large  Vitals Entered By: Haze Boyden, CMA (May 10, 2010 3:27 PM)  Physical Exam  General:  General: Well developed, well nourished, NAD HEENT: OP clear, mucus membranes moist SKIN: warm, dry. Bandage over wound on left ankle.  Neuro: No focal deficits Musculoskeletal: Muscle strength 5/5 all ext Psychiatric: Mood and affect normal Neck: No JVD, no carotid bruits, no thyromegaly, no lymphadenopathy. Lungs:Clear bilaterally, no wheezes, rhonci, crackles CV: RRR no murmurs, gallops rubs Abdomen: soft, NT, ND, BS present Extremities: No edema, pulses non-palpable bilateal DP/PT. left great toe amputation site is well healed.     Impression & Recommendations:  Problem # 1:  CAD, NATIVE VESSEL (ICD-414.01) Stable. Continue Plavix, beta blocker ands statin.   The following  medications were removed from the medication list:    Lisinopril 5 Mg Tabs (Lisinopril) Her updated medication list for this problem includes:    Plavix 75 Mg Tabs (Clopidogrel bisulfate) .Marland Kitchen... 1 tab once daily    Metoprolol Tartrate 50 Mg Tabs (Metoprolol tartrate) .Marland Kitchen... 1 tab two times a day    Nitrostat 0.4 Mg Subl (Nitroglycerin) .Marland Kitchen... 1 tablet under tongue at onset of chest pain; you may repeat every 5 minutes for up to 3 doses.    Coumadin 2 Mg Tabs (Warfarin sodium) .Marland Kitchen... Take 1/2 tablet daily at bedtime  Problem # 2:  ESSENTIAL HYPERTENSION, BENIGN (ICD-401.1) Well controlled.   The following medications were removed from the medication list:    Lisinopril 5 Mg Tabs (Lisinopril) Her updated medication list for this problem includes:    Metoprolol Tartrate 50 Mg Tabs (Metoprolol tartrate) .Marland Kitchen... 1 tab two times a day  Patient Instructions: 1)  Your physician recommends that you schedule a follow-up appointment in: 6 months.  2)  Your physician recommends that you continue on your current medications as directed. Please refer to the Current Medication list given to you today.

## 2010-07-05 NOTE — Letter (Signed)
Summary: [facility]   Imported By: Marylou Mccoy 06/26/2010 17:13:51  _____________________________________________________________________  External Attachment:    Type:   Image     Comment:   External Document

## 2010-07-28 ENCOUNTER — Encounter (HOSPITAL_BASED_OUTPATIENT_CLINIC_OR_DEPARTMENT_OTHER): Payer: Medicare Other | Admitting: Oncology

## 2010-07-28 ENCOUNTER — Other Ambulatory Visit: Payer: Self-pay | Admitting: Oncology

## 2010-07-28 DIAGNOSIS — C8296 Follicular lymphoma, unspecified, intrapelvic lymph nodes: Secondary | ICD-10-CM

## 2010-07-28 DIAGNOSIS — Z5112 Encounter for antineoplastic immunotherapy: Secondary | ICD-10-CM

## 2010-07-28 DIAGNOSIS — Z86718 Personal history of other venous thrombosis and embolism: Secondary | ICD-10-CM

## 2010-07-28 LAB — COMPREHENSIVE METABOLIC PANEL
ALT: 27 U/L (ref 0–35)
Alkaline Phosphatase: 112 U/L (ref 39–117)
CO2: 30 mEq/L (ref 19–32)
Creatinine, Ser: 0.91 mg/dL (ref 0.40–1.20)
Glucose, Bld: 67 mg/dL — ABNORMAL LOW (ref 70–99)
Total Bilirubin: 0.3 mg/dL (ref 0.3–1.2)

## 2010-07-28 LAB — LACTATE DEHYDROGENASE: LDH: 214 U/L (ref 94–250)

## 2010-07-28 LAB — CBC WITH DIFFERENTIAL/PLATELET
EOS%: 3 % (ref 0.0–7.0)
Eosinophils Absolute: 0.2 10*3/uL (ref 0.0–0.5)
LYMPH%: 11.9 % — ABNORMAL LOW (ref 14.0–49.7)
MCH: 29.4 pg (ref 25.1–34.0)
MCHC: 32.1 g/dL (ref 31.5–36.0)
MCV: 91.6 fL (ref 79.5–101.0)
MONO%: 10.5 % (ref 0.0–14.0)
NEUT#: 5.7 10*3/uL (ref 1.5–6.5)
Platelets: 155 10*3/uL (ref 145–400)
RBC: 4.42 10*6/uL (ref 3.70–5.45)
nRBC: 0 % (ref 0–0)

## 2010-08-09 LAB — SURGICAL PCR SCREEN
MRSA, PCR: NEGATIVE
Staphylococcus aureus: NEGATIVE

## 2010-08-09 LAB — APTT: aPTT: 27 seconds (ref 24–37)

## 2010-08-09 LAB — PROTIME-INR: Prothrombin Time: 13.5 seconds (ref 11.6–15.2)

## 2010-08-10 LAB — DIFFERENTIAL
Basophils Absolute: 0 10*3/uL (ref 0.0–0.1)
Basophils Absolute: 0 10*3/uL (ref 0.0–0.1)
Basophils Absolute: 0 10*3/uL (ref 0.0–0.1)
Basophils Relative: 0 % (ref 0–1)
Basophils Relative: 0 % (ref 0–1)
Basophils Relative: 0 % (ref 0–1)
Eosinophils Absolute: 0.1 10*3/uL (ref 0.0–0.7)
Eosinophils Absolute: 0.2 10*3/uL (ref 0.0–0.7)
Eosinophils Absolute: 0.2 10*3/uL (ref 0.0–0.7)
Eosinophils Relative: 1 % (ref 0–5)
Eosinophils Relative: 1 % (ref 0–5)
Eosinophils Relative: 3 % (ref 0–5)
Eosinophils Relative: 4 % (ref 0–5)
Lymphocytes Relative: 12 % (ref 12–46)
Lymphocytes Relative: 17 % (ref 12–46)
Lymphs Abs: 0.8 10*3/uL (ref 0.7–4.0)
Monocytes Absolute: 0.6 10*3/uL (ref 0.1–1.0)
Monocytes Absolute: 0.5 10*3/uL (ref 0.1–1.0)
Monocytes Absolute: 0.7 10*3/uL (ref 0.1–1.0)
Monocytes Absolute: 0.7 10*3/uL (ref 0.1–1.0)
Monocytes Relative: 7 % (ref 3–12)
Monocytes Relative: 10 % (ref 3–12)

## 2010-08-10 LAB — BASIC METABOLIC PANEL
BUN: 10 mg/dL (ref 6–23)
BUN: 11 mg/dL (ref 6–23)
BUN: 5 mg/dL — ABNORMAL LOW (ref 6–23)
BUN: 6 mg/dL (ref 6–23)
BUN: 7 mg/dL (ref 6–23)
BUN: 8 mg/dL (ref 6–23)
BUN: 8 mg/dL (ref 6–23)
BUN: 10 mg/dL (ref 6–23)
BUN: 5 mg/dL — ABNORMAL LOW (ref 6–23)
BUN: 8 mg/dL (ref 6–23)
CO2: 27 mEq/L (ref 19–32)
CO2: 28 mEq/L (ref 19–32)
CO2: 29 mEq/L (ref 19–32)
CO2: 30 mEq/L (ref 19–32)
CO2: 31 mEq/L (ref 19–32)
Calcium: 10 mg/dL (ref 8.4–10.5)
Calcium: 10.1 mg/dL (ref 8.4–10.5)
Calcium: 9 mg/dL (ref 8.4–10.5)
Calcium: 9.4 mg/dL (ref 8.4–10.5)
Calcium: 9.6 mg/dL (ref 8.4–10.5)
Calcium: 9.9 mg/dL (ref 8.4–10.5)
Chloride: 101 mEq/L (ref 96–112)
Chloride: 103 mEq/L (ref 96–112)
Chloride: 106 mEq/L (ref 96–112)
Chloride: 107 mEq/L (ref 96–112)
Chloride: 107 mEq/L (ref 96–112)
Chloride: 109 mEq/L (ref 96–112)
Chloride: 103 mEq/L (ref 96–112)
Chloride: 106 mEq/L (ref 96–112)
Chloride: 107 mEq/L (ref 96–112)
Creatinine, Ser: 0.75 mg/dL (ref 0.4–1.2)
Creatinine, Ser: 0.76 mg/dL (ref 0.4–1.2)
Creatinine, Ser: 0.77 mg/dL (ref 0.4–1.2)
Creatinine, Ser: 0.77 mg/dL (ref 0.4–1.2)
Creatinine, Ser: 0.83 mg/dL (ref 0.4–1.2)
Creatinine, Ser: 0.85 mg/dL (ref 0.4–1.2)
Creatinine, Ser: 1.11 mg/dL (ref 0.4–1.2)
Creatinine, Ser: 0.75 mg/dL (ref 0.4–1.2)
Creatinine, Ser: 0.79 mg/dL (ref 0.4–1.2)
GFR calc Af Amer: 59 mL/min — ABNORMAL LOW (ref >60)
GFR calc Af Amer: >60 mL/min (ref >60)
GFR calc Af Amer: >60 mL/min (ref >60)
GFR calc non Af Amer: >60 mL/min (ref >60)
GFR calc non Af Amer: >60 mL/min (ref >60)
GFR calc non Af Amer: >60 mL/min (ref >60)
Glucose, Bld: 178 mg/dL — ABNORMAL HIGH (ref 70–99)
Glucose, Bld: 81 mg/dL (ref 70–99)
Glucose, Bld: 90 mg/dL (ref 70–99)
Glucose, Bld: 102 mg/dL — ABNORMAL HIGH (ref 70–99)
Glucose, Bld: 128 mg/dL — ABNORMAL HIGH (ref 70–99)
Glucose, Bld: 94 mg/dL (ref 70–99)
Potassium: 3.7 mEq/L (ref 3.5–5.1)
Potassium: 2.9 mEq/L — ABNORMAL LOW (ref 3.5–5.1)
Potassium: 4 mEq/L (ref 3.5–5.1)
Sodium: 140 mEq/L (ref 135–145)

## 2010-08-10 LAB — CULTURE, BLOOD (ROUTINE X 2)
Culture  Setup Time: 201109170058
Culture: NO GROWTH

## 2010-08-10 LAB — COMPREHENSIVE METABOLIC PANEL
ALT: 41 U/L — ABNORMAL HIGH (ref 0–35)
AST: 52 U/L — ABNORMAL HIGH (ref 0–37)
Albumin: 3.3 g/dL — ABNORMAL LOW (ref 3.5–5.2)
CO2: 30 mEq/L (ref 19–32)
Calcium: 9.8 mg/dL (ref 8.4–10.5)
GFR calc Af Amer: >60 mL/min (ref >60)
GFR calc non Af Amer: 57 mL/min — ABNORMAL LOW (ref >60)
Sodium: 140 mEq/L (ref 135–145)

## 2010-08-10 LAB — HEPARIN LEVEL (UNFRACTIONATED)
Heparin Unfractionated: 0.22 IU/mL — ABNORMAL LOW (ref 0.30–0.70)
Heparin Unfractionated: 0.25 IU/mL — ABNORMAL LOW (ref 0.30–0.70)
Heparin Unfractionated: 0.3 IU/mL (ref 0.30–0.70)
Heparin Unfractionated: 0.41 IU/mL (ref 0.30–0.70)
Heparin Unfractionated: 0.49 IU/mL (ref 0.30–0.70)
Heparin Unfractionated: <0.1 IU/mL — ABNORMAL LOW (ref 0.30–0.70)

## 2010-08-10 LAB — CBC
HCT: 24.6 % — ABNORMAL LOW (ref 36.0–46.0)
HCT: 31.4 % — ABNORMAL LOW (ref 36.0–46.0)
HCT: 33.5 % — ABNORMAL LOW (ref 36.0–46.0)
HCT: 31.2 % — ABNORMAL LOW (ref 36.0–46.0)
HCT: 32.9 % — ABNORMAL LOW (ref 36.0–46.0)
HCT: 34 % — ABNORMAL LOW (ref 36.0–46.0)
Hemoglobin: 10.3 g/dL — ABNORMAL LOW (ref 12.0–15.0)
Hemoglobin: 9.9 g/dL — ABNORMAL LOW (ref 12.0–15.0)
MCH: 29.2 pg (ref 26.0–34.0)
MCH: 29.5 pg (ref 26.0–34.0)
MCH: 29.6 pg (ref 26.0–34.0)
MCH: 29.7 pg (ref 26.0–34.0)
MCH: 29.8 pg (ref 26.0–34.0)
MCH: 29.8 pg (ref 26.0–34.0)
MCHC: 30.8 g/dL (ref 30.0–36.0)
MCHC: 31.3 g/dL (ref 30.0–36.0)
MCHC: 31.5 g/dL (ref 30.0–36.0)
MCHC: 31.7 g/dL (ref 30.0–36.0)
MCHC: 31.9 g/dL (ref 30.0–36.0)
MCHC: 32.2 g/dL (ref 30.0–36.0)
MCHC: 31.7 g/dL (ref 30.0–36.0)
MCHC: 32.5 g/dL (ref 30.0–36.0)
MCV: 90.4 fL (ref 78.0–100.0)
MCV: 92.5 fL (ref 78.0–100.0)
MCV: 93.6 fL (ref 78.0–100.0)
MCV: 94.3 fL (ref 78.0–100.0)
MCV: 91.4 fL (ref 78.0–100.0)
MCV: 92.9 fL (ref 78.0–100.0)
MCV: 94.8 fL (ref 78.0–100.0)
MCV: 95 fL (ref 78.0–100.0)
Platelets: 200 10*3/uL (ref 150–400)
Platelets: 223 10*3/uL (ref 150–400)
Platelets: 223 10*3/uL (ref 150–400)
Platelets: 244 10*3/uL (ref 150–400)
Platelets: 258 10*3/uL (ref 150–400)
Platelets: 271 10*3/uL (ref 150–400)
Platelets: 278 10*3/uL (ref 150–400)
Platelets: 283 10*3/uL (ref 150–400)
Platelets: 133 10*3/uL — ABNORMAL LOW (ref 150–400)
Platelets: 139 10*3/uL — ABNORMAL LOW (ref 150–400)
RBC: 3.02 MIL/uL — ABNORMAL LOW (ref 3.87–5.11)
RBC: 3.3 MIL/uL — ABNORMAL LOW (ref 3.87–5.11)
RBC: 3.51 MIL/uL — ABNORMAL LOW (ref 3.87–5.11)
RDW: 13.5 % (ref 11.5–15.5)
RDW: 13.5 % (ref 11.5–15.5)
RDW: 13.5 % (ref 11.5–15.5)
RDW: 13.7 % (ref 11.5–15.5)
RDW: 13.8 % (ref 11.5–15.5)
RDW: 13.8 % (ref 11.5–15.5)
RDW: 14 % (ref 11.5–15.5)
RDW: 13 % (ref 11.5–15.5)
RDW: 13.2 % (ref 11.5–15.5)
RDW: 13.2 % (ref 11.5–15.5)
RDW: 13.2 % (ref 11.5–15.5)
WBC: 5.4 10*3/uL (ref 4.0–10.5)
WBC: 5.5 10*3/uL (ref 4.0–10.5)
WBC: 6.1 10*3/uL (ref 4.0–10.5)
WBC: 4.6 10*3/uL (ref 4.0–10.5)
WBC: 5.5 10*3/uL (ref 4.0–10.5)

## 2010-08-10 LAB — PREPARE FRESH FROZEN PLASMA

## 2010-08-10 LAB — FUNGUS CULTURE W SMEAR: Fungal Smear: NONE SEEN

## 2010-08-10 LAB — URINALYSIS, ROUTINE W REFLEX MICROSCOPIC
Bilirubin Urine: NEGATIVE
Glucose, UA: NEGATIVE mg/dL
Ketones, ur: NEGATIVE mg/dL
Protein, ur: NEGATIVE mg/dL
pH: 6.5 (ref 5.0–8.0)

## 2010-08-10 LAB — PROTIME-INR
INR: 1.37 (ref 0.00–1.49)
INR: 1.72 — ABNORMAL HIGH (ref 0.00–1.49)
INR: 2.34 — ABNORMAL HIGH (ref 0.00–1.49)
INR: 2.75 — ABNORMAL HIGH (ref 0.00–1.49)
INR: 5.07 (ref 0.00–1.49)
INR: 5.87 (ref 0.00–1.49)
INR: 1.98 — ABNORMAL HIGH (ref 0.00–1.49)
Prothrombin Time: 17.1 seconds — ABNORMAL HIGH (ref 11.6–15.2)
Prothrombin Time: 22.8 seconds — ABNORMAL HIGH (ref 11.6–15.2)
Prothrombin Time: 25.8 seconds — ABNORMAL HIGH (ref 11.6–15.2)
Prothrombin Time: 33.6 seconds — ABNORMAL HIGH (ref 11.6–15.2)
Prothrombin Time: 46.7 seconds — ABNORMAL HIGH (ref 11.6–15.2)
Prothrombin Time: 15.4 seconds — ABNORMAL HIGH (ref 11.6–15.2)
Prothrombin Time: 15.9 seconds — ABNORMAL HIGH (ref 11.6–15.2)
Prothrombin Time: 22.7 seconds — ABNORMAL HIGH (ref 11.6–15.2)
Prothrombin Time: 26 seconds — ABNORMAL HIGH (ref 11.6–15.2)

## 2010-08-10 LAB — ASPERGILLUS GALACTOMANNAN ANTIGEN: Aspergillus galactomannan Index: 0.1 (ref <0.5)

## 2010-08-10 LAB — AFB CULTURE WITH SMEAR (NOT AT ARMC)

## 2010-08-10 LAB — CROSSMATCH: Antibody Screen: NEGATIVE

## 2010-08-10 LAB — FUNGAL ANTIBODIES PANEL, ID-BLOOD
Aspergillus Flavus Antibodies: NEGATIVE
Aspergillus Niger Antibodies: NEGATIVE
Coccidioides Antibody ID: NEGATIVE
Histoplasma Ab, Immunodiffusion: NEGATIVE

## 2010-08-10 LAB — CULTURE, BLOOD (SINGLE): Culture: NO GROWTH

## 2010-08-10 LAB — FUNGAL SUSCEPTIBILITY

## 2010-08-10 LAB — SEDIMENTATION RATE
Sed Rate: 79 mm/hr — ABNORMAL HIGH (ref 0–22)
Sed Rate: 36 mm/hr — ABNORMAL HIGH (ref 0–22)

## 2010-08-10 LAB — URINE MICROSCOPIC-ADD ON

## 2010-08-10 LAB — CRYPTOCOCCAL ANTIGEN: Crypto Ag: NEGATIVE

## 2010-08-10 LAB — VANCOMYCIN, TROUGH: Vancomycin Tr: 14.8 ug/mL (ref 10.0–20.0)

## 2010-08-13 LAB — BONE MARROW EXAM

## 2010-08-14 LAB — CBC
MCV: 87.8 fL (ref 78.0–100.0)
RBC: 4.03 MIL/uL (ref 3.87–5.11)
WBC: 7.7 10*3/uL (ref 4.0–10.5)

## 2010-08-14 LAB — CROSSMATCH
ABO/RH(D): A POS
Antibody Screen: NEGATIVE

## 2010-08-14 LAB — PROTIME-INR: Prothrombin Time: 13.2 seconds (ref 11.6–15.2)

## 2010-08-18 ENCOUNTER — Encounter (INDEPENDENT_AMBULATORY_CARE_PROVIDER_SITE_OTHER): Payer: Medicare Other

## 2010-08-18 ENCOUNTER — Ambulatory Visit (INDEPENDENT_AMBULATORY_CARE_PROVIDER_SITE_OTHER): Payer: Medicare Other | Admitting: Vascular Surgery

## 2010-08-18 DIAGNOSIS — Z48812 Encounter for surgical aftercare following surgery on the circulatory system: Secondary | ICD-10-CM

## 2010-08-18 DIAGNOSIS — I70219 Atherosclerosis of native arteries of extremities with intermittent claudication, unspecified extremity: Secondary | ICD-10-CM

## 2010-08-18 DIAGNOSIS — I739 Peripheral vascular disease, unspecified: Secondary | ICD-10-CM

## 2010-08-21 NOTE — Assessment & Plan Note (Signed)
OFFICE VISIT  [name], [name] DOB:  [birth year]                                       08/18/2010 ZOXWR#:[id number]  This is an established patient followup.  HISTORY OF PRESENT ILLNESS:  This is a 70 year old female that I have previously done a left femoral to above knee bypass with reversed ipsilateral saphenous vein and also I have done a left great toe amputation.  At this point the patient has no symptomatology in the left foot.  She actually healed up her amputation and her shin wound.  At this point she is able to ambulate without any significant complaints.  Her past medical history, past surgical history, social history, review of systems, medications, allergies are completely unchanged from her previous visits.  PHYSICAL EXAMINATION:  Vital signs:  She had a blood pressure 164/91, heart rate of 57, respirations were 12. General:  Well-developed, well-nourished, no apparent distress. HEENT:  Pupils were equal, round, reactive.  Extraocular movements were intact.  Her conjunctive are normal. Pulmonary:  Symmetric expansion.  Good air movement.  No rales, rhonchi or wheezing. Cardiovascular:  Regular rate and rhythm.  Normal S1-S2.  No murmurs, rubs, thrills or gallops. Vascular:  She had a weakly palpable right posterior tibial and a weakly palpable posterior tibial pulse on the left side. Abdomen:  Soft abdomen, nontender, nondistended.  No guarding, no rebound, no hepatosplenomegaly. Musculoskeletal:  She had 5/5 strength in all extremities.  Her left foot the great toe amputation site has healed well and her shin ulceration has completely healed at this point. Neuro:  Cranial nerves II-XII were intact.  Motor exam was as above. Her sensation is grossly intact. Psychiatric:  Her judgment was intact.  Her mood and affect were appropriate for her clinical situation. Skin:  There were no obvious rashes elsewhere.  Extremities were  as listed above. Lymphatic:  No cervical, axillary or inguinal lymphadenopathy.  She had 2 sets of noninvasive studies completed.  The ABI on the right side was 0.71 up from 0.64.  On the left side she had an ABI of 0.70 with an ABI of 0.89 which is increased from the previous.  All sets of vessels are monophasic.  In looking at her duplex scan of her graft it demonstrates elevated velocities at near the anastomosis up to 679 c/s, also elevated velocities in the popliteal up to 250 c/s.  This is a high enough velocity to be concerned for possible stenosis of the distal anastomosis.  MEDICAL DECISION MAKING:  This is a 70 year old female who is status post a left common femoral artery to above knee pop bypass with reversed ipsilateral saphenous vein.  She has healed up all her incisions and wounds at this point.  She is asymptomatic.  I think, however, it would be a good idea to try to preserve flow in this foot.  She has been able to gain weight since recovering from her chemotherapy.  I think that she would be better able to handle any type of intervention at this point. My plan is to do a left leg angiogram to see if this elevated velocity is verified by the angiogram and then intervene on that distal anastomosis with cutting balloon angioplasty if needed.  The patient is aware of the plan.  She is aware of the risks of this including bleeding, infection, possible induction of  renal failure from the dye, possible embolization, possible rupture of the vessel, possible need for emergency intervention and possible need for additional procedures.  She is aware of such and agrees to proceed forward.    [name], MD Electronically Signed  BLC/MEDQ  D:  08/18/2010  T:  08/21/2010  Job:  [id number]

## 2010-08-26 NOTE — Procedures (Unsigned)
BYPASS GRAFT EVALUATION  INDICATION:  Follow up bypass graft placement.  HISTORY: Diabetes:  No. Cardiac:  MI, October 2010. Hypertension:  Yes. Smoking:  Previous. Previous Surgery:  Left femoral-to-popliteal bypass graft, 02/15/2010, left great toe amputation.  SINGLE LEVEL ARTERIAL EXAM                              RIGHT              LEFT Brachial:                    201                191 Anterior tibial:             106                118 Posterior tibial:            142                140 Peroneal: Ankle/brachial index:        0.71               0.70  PREVIOUS ABI:  Date: 05/19/2010  RIGHT:  0.64  LEFT:  0.89  LOWER EXTREMITY BYPASS GRAFT DUPLEX EXAM:  DUPLEX:  Monophasic Doppler waveforms noted throughout the left lower extremity bypass graft in its native vessels with an increased velocity of 679 cm/s noted at the left distal anastomosis level.  IMPRESSION: 1. Patent left femoral-to-popliteal bypass graft with an increased     velocity, as described above. 2. The bilateral ankle brachial indices suggest moderate arterial     disease. 3. Right ankle brachial index has improved from previous study. 4. Left ankle brachial index has declined from previous study.  ___________________________________________ [name], MD  EM/MEDQ  D:  08/21/2010  T:  08/21/2010  Job:  [id number]

## 2010-08-28 ENCOUNTER — Inpatient Hospital Stay (HOSPITAL_COMMUNITY)
Admission: RE | Admit: 2010-08-28 | Discharge: 2010-08-29 | DRG: 252 | Disposition: A | Discharge status: Home / Self Care | Payer: Medicare Other | Source: Ambulatory Visit | Attending: Vascular Surgery | Admitting: Vascular Surgery

## 2010-08-28 ENCOUNTER — Ambulatory Visit (HOSPITAL_COMMUNITY): Payer: Medicare Other

## 2010-08-28 DIAGNOSIS — I70209 Unspecified atherosclerosis of native arteries of extremities, unspecified extremity: Principal | ICD-10-CM | POA: Diagnosis present

## 2010-08-28 DIAGNOSIS — I70219 Atherosclerosis of native arteries of extremities with intermittent claudication, unspecified extremity: Secondary | ICD-10-CM

## 2010-08-28 DIAGNOSIS — I7779 Dissection of other artery: Secondary | ICD-10-CM | POA: Diagnosis not present

## 2010-08-28 DIAGNOSIS — S98119A Complete traumatic amputation of unspecified great toe, initial encounter: Secondary | ICD-10-CM

## 2010-08-28 DIAGNOSIS — I959 Hypotension, unspecified: Secondary | ICD-10-CM | POA: Diagnosis not present

## 2010-08-28 DIAGNOSIS — T465X5A Adverse effect of other antihypertensive drugs, initial encounter: Secondary | ICD-10-CM | POA: Diagnosis present

## 2010-08-28 HISTORY — PX: ANGIOPLASTY / STENTING FEMORAL: SUR30

## 2010-08-28 LAB — BASIC METABOLIC PANEL
CO2: 26 mEq/L (ref 19–32)
Calcium: 8.5 mg/dL (ref 8.4–10.5)
Chloride: 106 mEq/L (ref 96–112)
GFR calc Af Amer: >60 mL/min (ref >60)
Sodium: 141 mEq/L (ref 135–145)

## 2010-08-28 LAB — POCT I-STAT, CHEM 8
Calcium, Ion: 0.96 mmol/L — ABNORMAL LOW (ref 1.12–1.32)
Glucose, Bld: 82 mg/dL (ref 70–99)
HCT: 39 % (ref 36.0–46.0)
Hemoglobin: 13.3 g/dL (ref 12.0–15.0)
Potassium: 4.6 mEq/L (ref 3.5–5.1)
TCO2: 27 mmol/L (ref 0–100)

## 2010-08-28 LAB — CK TOTAL AND CKMB (NOT AT ARMC)
CK, MB: 6.6 ng/mL (ref 0.3–4.0)
Relative Index: INVALID (ref 0.0–2.5)
Total CK: 59 U/L (ref 7–177)

## 2010-08-28 LAB — CBC
MCH: 29.4 pg (ref 26.0–34.0)
MCV: 89.9 fL (ref 78.0–100.0)
Platelets: 154 10*3/uL (ref 150–400)
RDW: 14.7 % (ref 11.5–15.5)

## 2010-08-28 LAB — MRSA PCR SCREENING: MRSA by PCR: NEGATIVE

## 2010-08-29 LAB — CARDIAC PANEL(CRET KIN+CKTOT+MB+TROPI): Total CK: 46 U/L (ref 7–177)

## 2010-08-29 LAB — BASIC METABOLIC PANEL
BUN: 15 mg/dL (ref 6–23)
CO2: 28 mEq/L (ref 19–32)
Calcium: 8.5 mg/dL (ref 8.4–10.5)
Chloride: 108 mEq/L (ref 96–112)
Creatinine, Ser: 0.88 mg/dL (ref 0.4–1.2)
GFR calc Af Amer: >60 mL/min (ref >60)
Glucose, Bld: 86 mg/dL (ref 70–99)

## 2010-08-29 LAB — CBC
Hemoglobin: 10.5 g/dL — ABNORMAL LOW (ref 12.0–15.0)
MCH: 29.2 pg (ref 26.0–34.0)
MCHC: 32.1 g/dL (ref 30.0–36.0)
MCV: 91.1 fL (ref 78.0–100.0)

## 2010-08-29 LAB — HEMOGLOBIN AND HEMATOCRIT, BLOOD
HCT: 33.3 % — ABNORMAL LOW (ref 36.0–46.0)
Hemoglobin: 11 g/dL — ABNORMAL LOW (ref 12.0–15.0)

## 2010-08-31 LAB — CBC
HCT: 28.8 % — ABNORMAL LOW (ref 36.0–46.0)
HCT: 28.9 % — ABNORMAL LOW (ref 36.0–46.0)
HCT: 29.6 % — ABNORMAL LOW (ref 36.0–46.0)
Hemoglobin: 10.6 g/dL — ABNORMAL LOW (ref 12.0–15.0)
Hemoglobin: 9.8 g/dL — ABNORMAL LOW (ref 12.0–15.0)
Hemoglobin: 9.9 g/dL — ABNORMAL LOW (ref 12.0–15.0)
MCHC: 34 g/dL (ref 30.0–36.0)
MCV: 90.9 fL (ref 78.0–100.0)
MCV: 91 fL (ref 78.0–100.0)
Platelets: 128 10*3/uL — ABNORMAL LOW (ref 150–400)
Platelets: 132 10*3/uL — ABNORMAL LOW (ref 150–400)
RBC: 3.17 MIL/uL — ABNORMAL LOW (ref 3.87–5.11)
RBC: 3.19 MIL/uL — ABNORMAL LOW (ref 3.87–5.11)
RBC: 3.44 MIL/uL — ABNORMAL LOW (ref 3.87–5.11)
RDW: 14.1 % (ref 11.5–15.5)
RDW: 14.2 % (ref 11.5–15.5)
RDW: 14.2 % (ref 11.5–15.5)
WBC: 7.7 10*3/uL (ref 4.0–10.5)
WBC: 8.1 10*3/uL (ref 4.0–10.5)
WBC: 8.6 10*3/uL (ref 4.0–10.5)
WBC: 9.7 10*3/uL (ref 4.0–10.5)

## 2010-08-31 LAB — PROTIME-INR
INR: 1.21 (ref 0.00–1.49)
INR: 4.32 — ABNORMAL HIGH (ref 0.00–1.49)
Prothrombin Time: 15.2 seconds (ref 11.6–15.2)
Prothrombin Time: 41.1 seconds — ABNORMAL HIGH (ref 11.6–15.2)

## 2010-08-31 LAB — BASIC METABOLIC PANEL
BUN: 10 mg/dL (ref 6–23)
BUN: 11 mg/dL (ref 6–23)
CO2: 25 mEq/L (ref 19–32)
CO2: 27 mEq/L (ref 19–32)
Calcium: 8.8 mg/dL (ref 8.4–10.5)
Calcium: 8.9 mg/dL (ref 8.4–10.5)
Chloride: 103 mEq/L (ref 96–112)
Chloride: 107 mEq/L (ref 96–112)
Creatinine, Ser: 1.09 mg/dL (ref 0.4–1.2)
Creatinine, Ser: 1.12 mg/dL (ref 0.4–1.2)
GFR calc Af Amer: 56 mL/min — ABNORMAL LOW (ref >60)
GFR calc Af Amer: 59 mL/min — ABNORMAL LOW (ref >60)
GFR calc Af Amer: >60 mL/min (ref >60)
GFR calc non Af Amer: 46 mL/min — ABNORMAL LOW (ref >60)
GFR calc non Af Amer: 48 mL/min — ABNORMAL LOW (ref >60)
GFR calc non Af Amer: 50 mL/min — ABNORMAL LOW (ref >60)
Glucose, Bld: 101 mg/dL — ABNORMAL HIGH (ref 70–99)
Glucose, Bld: 104 mg/dL — ABNORMAL HIGH (ref 70–99)
Glucose, Bld: 108 mg/dL — ABNORMAL HIGH (ref 70–99)
Potassium: 3.5 mEq/L (ref 3.5–5.1)
Potassium: 3.7 mEq/L (ref 3.5–5.1)
Potassium: 4.1 mEq/L (ref 3.5–5.1)
Sodium: 133 mEq/L — ABNORMAL LOW (ref 135–145)
Sodium: 137 mEq/L (ref 135–145)
Sodium: 139 mEq/L (ref 135–145)

## 2010-08-31 LAB — CARDIAC PANEL(CRET KIN+CKTOT+MB+TROPI)
CK, MB: 38.1 ng/mL — ABNORMAL HIGH (ref 0.3–4.0)
CK, MB: 51.1 ng/mL — ABNORMAL HIGH (ref 0.3–4.0)
CK, MB: 9.5 ng/mL — ABNORMAL HIGH (ref 0.3–4.0)
Relative Index: 13 — ABNORMAL HIGH (ref 0.0–2.5)
Relative Index: 13.7 — ABNORMAL HIGH (ref 0.0–2.5)
Relative Index: 9.4 — ABNORMAL HIGH (ref 0.0–2.5)
Total CK: 101 U/L (ref 7–177)
Total CK: 293 U/L — ABNORMAL HIGH (ref 7–177)
Total CK: 374 U/L — ABNORMAL HIGH (ref 7–177)
Troponin I: 2.49 ng/mL (ref 0.00–0.06)
Troponin I: 24.31 ng/mL (ref 0.00–0.06)
Troponin I: 26.06 ng/mL (ref 0.00–0.06)

## 2010-08-31 LAB — LIPID PANEL
Cholesterol: 131 mg/dL (ref 0–200)
HDL: 35 mg/dL — ABNORMAL LOW (ref >39)
LDL Cholesterol: 77 mg/dL (ref 0–99)
Total CHOL/HDL Ratio: 3.7 RATIO
Triglycerides: 93 mg/dL (ref <150)
VLDL: 19 mg/dL (ref 0–40)

## 2010-08-31 LAB — HEMOGLOBIN A1C
Hgb A1c MFr Bld: 5.7 % (ref 4.6–6.1)
Mean Plasma Glucose: 117 mg/dL

## 2010-08-31 LAB — APTT
aPTT: 81 seconds — ABNORMAL HIGH (ref 24–37)
aPTT: >200 seconds (ref 24–37)

## 2010-08-31 LAB — IRON AND TIBC
Iron: 11 ug/dL — ABNORMAL LOW (ref 42–135)
UIBC: 185 ug/dL

## 2010-08-31 LAB — PLATELET COUNT: Platelets: 121 10*3/uL — ABNORMAL LOW (ref 150–400)

## 2010-08-31 LAB — RETICULOCYTES: RBC.: 3.39 MIL/uL — ABNORMAL LOW (ref 3.87–5.11)

## 2010-08-31 LAB — MAGNESIUM: Magnesium: 1.6 mg/dL (ref 1.5–2.5)

## 2010-08-31 LAB — VITAMIN B12: Vitamin B-12: 227 pg/mL (ref 211–911)

## 2010-09-01 NOTE — Op Note (Signed)
NAMEPARVEEN, [name] NO.:  [id number]  MEDICAL RECORD NO.:  [id number]           PATIENT TYPE:  I  LOCATION:  2907                         FACILITY:  [facility]  PHYSICIAN:  [name], MD       DATE OF BIRTH:  October 29, 1940  DATE OF PROCEDURE:  08/28/2010 DATE OF DISCHARGE:                              OPERATIVE REPORT   PROCEDURE: 1. Right common femoral artery cannulation under ultrasound guidance. 2. Left leg angiogram. 3. Left distal anastomosis, angioplasty with a 3-mm x 20-mm balloon     and a 4 x 40-mm balloon. 4. Left distal anastomosis stenting with a 4 mm x 40-mm stent. 5. Aspiration thrombectomy of the distal anastomosis of the popliteal     artery.  PREOPERATIVE DIAGNOSIS:  Distal anastomosis stenosis.  POSTOPERATIVE DIAGNOSIS:  Distal anastomosis stenosis.  SURGEON:  [name], MD  ANESTHESIA:  Conscious sedation.  ESTIMATED BLOOD LOSS:  50 mL, contrast 160 mL.  SPECIMENS:  None.  FINDINGS: 1. This was a patent left common femoral artery to above-knee     popliteal bypass with distal anastomosis stenosis greater than 90%. 2. Resolution of stenosis with angioplasty x2. 3. Dissection of the above-knee popliteal artery. 4. Resolution of most of dissection with stenting. 5. Peroneal artery remains patent and is the only runoff in the leg. 6. Below-knee popliteal artery is patent to only about 2 to 2.5-mm.  INDICATIONS:  This is a 70 year old female previously I have brought back to the operating room for a left common femoral artery to above- knee popliteal artery bypass, reverses ipsilateral greater saphenous vein.  She had inadequate vein length to be able to complete a below-the- knee pop bypass.  Additionally, I felt her below-the-knee pop was small to begin with.  I was able to find a patent area of the above-knee pocket for use for this bypass that was completed in September 2011.  At that point, she had a shin ulceration and also a  ischemic toe.  After this operation, I went on to amputate her left great toe without any difficulties.  She was able to heal all of her wounds on this left side with this bypass.  Recently on her routine followup, I found her to have a drop-off in her ABIs on the left from previously 0.89 down to 0.70. Additionally at the distal anastomosis, there was a velocity that was elevated at 679 cm/sec.  As this patient only has one leg runoff and her below-the-knee popliteal artery is a poor target, I felt that a intervention would be necessary.  The patient is aware of the risks of this procedure including bleeding, infection, possible embolization, possible rupture of vessel, possible need for emergent operation, and possible need for additional surgical intervention.  She was aware of these risks and agreed to proceed forward.  DESCRIPTION OF OPERATION:  After full informed written consent was obtained from the patient, she was brought back to the angio suite and placed supine upon angio table.  She was connected to monitoring equipment and given conscious sedation, amounts are listed above.  I then turned my attention to  her right groin.  Under ultrasound guidance, I cannulated the right common femoral artery and with a 18 gauge needle, and passed a Wholey wire up into the aorta.  The needle was then exchanged for a 5-French sheath.  I then removed the dilator and then placed Omniflush catheter over the wire.  Using the [facility] wire, I was able to select out the left common iliac artery and advanced the wire down the external iliac artery, however, the catheter would not pass, so I exchanged it for an end-hole catheter which was used to cross over into the left external iliac artery.  At this point, I did remove the wire and connected the catheter to the power injector circuit.  A left leg runoff was completed, the findings of which are listed above.  Based on this, I felt intervention of  this distal anastomosis is necessary.  We placed Wholey wire down into the external iliac artery.  The sheath was then exchanged for a 6-French Ansell sheath which was loaded down to the level of the distal external iliac artery.  I gave initially 5000 units of heparin after crossing the bifurcation to [facility] sheath. The ACT from that was only in the 150 range, so she received another additional 2000 units of heparin intravenously and then had a therapeutic ACT at that point.  At this point using a KMP catheter with the wire, I was able to select out the arterial anastomosis and advanced the catheter down into the arterial anastomosis.  At this point, we changed the wire out for a 0.014" Spartacore wire.  I then exchanged the catheter out for a Quick cross catheter.  Using this combination, I was able to eventually get through the distal anastomosis with some difficulty.  Eventually I was able to get down into the above- the-knee popliteal artery and get the wire down into the below-knee popliteal segment.  I then exchanged the catheter for a 3-mm x 20-mm balloon.  This was inflated to nominal pressures at 8 atmospheres for about a minute.  There remained some residual waste at this point, so I obtained a 4-mm x 40-mm balloon.  This was centered around the distal anastomosis and inflated to profile at a nominal pressure of 6 atmospheres for 1 minute.  Note that the graft proximal to the anastomosis and the artery distal to anastomosis appeared to be bigger than this 4-mm balloon.  I then deflated the balloon and then removed it.  Hand injections demonstrated some flow disturbance at the level of the distal anastomosis.  I had some concerns that this was a clot, so I obtained a aspiration catheter which was advanced down to the level of the below-the-knee popliteal artery and pulled back into the above-knee pop artery.  We aspirated twice in the above-the-knee pop artery into the graft.   At no time was clot extracted on attempting to filter out the affluent from the aspiration catheter.  The aspiration catheter was then removed.  At this point, I completed another hand injection and was convinced this was in fact a dissection probably originating from the anastomosis down into the above-knee popliteal artery.  So at this point I obtained a 4-mm x 40-mm balloon and placed it centered around the anastomosis, and inflated to 4 atmospheres for 2 minutes without any difficulties.  The balloon was removed and then completion angiogram was completed.  It continues to demonstrate significant amount of dissection, so rather than risking a thrombosis of this bypass graft  due to flow limiting dissection, I obtained a 4-mm x 40-mm stent.  This was deployed, centered around the anastomosis, and a completion  angiogram on this, that was completed with power injection demonstrated resolution of most of the dissection and greatly improved flow patterns in this distal anastomosis and also above-the-knee popliteal artery.  At this point I felt that no further intervention was going to be of any use and that any further intervention may cause injury, so I pulled out the wire and pulled the sheath back into the right external iliac artery, aspirated out the sheath.  There were no clots and then I flushed it with heparinized saline.  The plan is to discharge this patient after she has her sheath successfully removed.    Complications:  above-knee popliteal artery dissection.    Condition was stable.      [name], MD     BLC/MEDQ  D:  08/28/2010  T:  08/29/2010  Job:  [id number]  Electronically Signed by Leonides Sake MD on 09/01/2010 05:35:19 PM

## 2010-09-01 NOTE — Discharge Summary (Signed)
NAMEBRITANI, BEATTIE NO.:  [id number]  MEDICAL RECORD NO.:  [id number]           PATIENT TYPE:  I  LOCATION:  2907                         FACILITY:  [facility]  PHYSICIAN:  [name], MD       DATE OF BIRTH:  08-Apr-1941  DATE OF ADMISSION:  08/28/2010 DATE OF DISCHARGE:  08/29/2010                              DISCHARGE SUMMARY   HISTORY OF PRESENT ILLNESS:  This is a 70 year old female that previously had undergone a left common femoral artery to above knee popliteal artery bypass and reverse ipsilateral greater saphenous vein. I recently saw her in office on August 18, 2010.  Please refer to that note for additional history.  She basically presented for routine followup on her duplex studies and ABIs.  There was suggestion of distal anastomotic stenosis as she only has a single-vessel runoff and her below-the-knee popliteal artery is of a caliber that any bypass would be possibly questionable and long-term patency.  I felt that attempt to treat endovascularly her distal anastomosis was indicated.  She was brought back to the angio suite on August 28, 2010, for a left leg angiogram and intervention.  Based on my images, I have felt that she needed an angioplasty of the distal anastomosis.  It was nearly occluded.  We eventually opened this up successfully with serial angioplasties; however, she then developed a dissection and above-the- knee popliteal segment, so I had to place a stent to help treat this dissection in the segment.  Postoperatively, she had an adverse reaction to a single dose of hydralazine dropping her pressures into the 70s and her rate down to the 30s.  She was given atropine, this restored her heart rate, but her blood pressure remained low in the 80s and 90s.  She received a bolus of fluid.  She also at this point developed pain in her right groin so there was some concern for possible retroperitoneal bleeding even though there was no clear  mechanism for an injury.  She was taken emergently to the CAT scan where stat abdomen and pelvis was completed without contrast demonstrated no retroperitoneal hematoma. Her blood pressure began to stabilize after a couple hours and she required no additional transfusions.  Her H and H level remained stable and eventually was successfully titrated off any oxygen.  Her left foot remained viable, warm, and pink.  She had absolutely no symptomatology during this entire period.  Based on her findings on examination, the date August 29, 2010, I felt that she had recovered completely from her adverse reaction to the single dose of hydralazine and had sustained absolutely no permanent complication from her intervention.  The right groin in fact was completely soft without any clinically obvious hematoma at this point.  ADMITTING DIAGNOSES: 1. Hypotension secondary to medication. 2. Status post distal anastomotic angioplasty and stenting.  DISCHARGE DIAGNOSES: 1. Hypotension secondary to medication. 2. Status post distal anastomotic angioplasty and stenting.  No consults were obtained during this admission.  Her discharge medications are completely unchanged from previous.  DISCHARGE WOUND CARE:  She is to avoid any heavy lifting for 2 weeks.  Discharge diet is unchanged.  She is to continue with a cardiac diet.  Followup will be in 3 weeks in the office which my office will contact her to arrange.     [name], MD     BLC/MEDQ  D:  08/29/2010  T:  08/30/2010  Job:  [id number]  Electronically Signed by Leonides Sake MD on 09/01/2010 04:42:33 PM

## 2010-09-15 ENCOUNTER — Ambulatory Visit (INDEPENDENT_AMBULATORY_CARE_PROVIDER_SITE_OTHER): Payer: Medicare Other | Admitting: Vascular Surgery

## 2010-09-15 DIAGNOSIS — I7092 Chronic total occlusion of artery of the extremities: Secondary | ICD-10-CM

## 2010-09-18 ENCOUNTER — Encounter: Payer: Self-pay | Admitting: Vascular Surgery

## 2010-09-18 NOTE — Assessment & Plan Note (Signed)
OFFICE VISIT  Brandi, Stone DOB:  [birth year]                                       09/15/2010 EAVWU#:[id number]  This is an established patient followup.  HISTORY OF PRESENT ILLNESS:  A 70 year old female that I brought back to the angio suite on 08/28/2010.  She had a distal anastomotic stenosis in a left common femoral artery to above knee popliteal bypass.  The distal segment of this bypass was nearly occluded.  I ended up angioplastying it with a 3 mm then a 4 mm balloon.  This developed into a dissection in the above knee popliteal artery.  This required then placement of a 4 mm x 40 mm stent.  At this point the patient now has complete resolution of her previous symptomatology.  She has no fever or chills and is able to ambulate without any problems.  Her past medical history past surgical history, social history, family history, review of systems, medications, allergies are completely unchanged from her previous clinic visits.  PHYSICAL EXAMINATION:  Vital signs:  Today she had blood pressure 137/87, heart rate of 64, respirations 12, temperature 97.9. General:  Well-developed, well-nourished, in no apparent distress. HEENT:  Pupils equal, round, react to light.  Extraocular movements were intact.  Conjunctivae normal. Lungs:  Clear to auscultation bilaterally.  No rales, rhonchi or wheezing. Cardiac:  Regular rate and rhythm.  Normal S1-S2.  No murmurs, rubs, thrills or gallops. Vascular:  There were easily palpable femoral pulses and palpable weak left popliteal pulse.  No DP on this side.  There are no palpable pedals on the left side.  Neither popliteal was palpable.  Easily palpable upper extremity pulses.  There was no prominent aortic pulse. Abdomen:  Soft abdomen, nontender, nondistended.  No guarding, no rebound. Musculoskeletal:  She had 5/5 strength throughout.  No ischemic changes. Her left foot is cool to touch but no  gangrenous changes.  Her left great toe amputation site is well-healed.  The right groin demonstrates no hematoma or pseudoaneurysm from previous cannulation. Neuro:  No focal weakness or paresthesia. Skin:  No ulcers or rashes. Lymphatic:  No cervical, axillary or inguinal lymphadenopathy.  MEDICAL DECISION MAKING:  This is a 70 year old patient who presents now status post an endovascular intervention on the distal anastomosis of her left femoral artery to above knee popliteal bypass.  It appears to be successful as the patient is now asymptomatic.  I am going to repeat her ABIs and duplexes within the next month.  She will follow up in a month.  From then on I will continue surveillance about a 3 month interval; given the placement of a 4 mm stent in the distal artery she is at risk for occlusion of that stent.    [name], MD Electronically Signed  BLC/MEDQ  D:  09/15/2010  T:  09/18/2010  Job:  [id number]

## 2010-09-22 ENCOUNTER — Ambulatory Visit: Payer: Medicare Other | Admitting: Vascular Surgery

## 2010-09-26 ENCOUNTER — Encounter: Payer: Self-pay | Admitting: Vascular Surgery

## 2010-09-29 ENCOUNTER — Other Ambulatory Visit: Payer: Self-pay | Admitting: Oncology

## 2010-09-29 ENCOUNTER — Encounter (HOSPITAL_BASED_OUTPATIENT_CLINIC_OR_DEPARTMENT_OTHER): Payer: Medicare Other | Admitting: Oncology

## 2010-09-29 DIAGNOSIS — Z86718 Personal history of other venous thrombosis and embolism: Secondary | ICD-10-CM

## 2010-09-29 DIAGNOSIS — C8296 Follicular lymphoma, unspecified, intrapelvic lymph nodes: Secondary | ICD-10-CM

## 2010-09-29 DIAGNOSIS — C8590 Non-Hodgkin lymphoma, unspecified, unspecified site: Secondary | ICD-10-CM

## 2010-09-29 DIAGNOSIS — Z23 Encounter for immunization: Secondary | ICD-10-CM

## 2010-09-29 DIAGNOSIS — Z5112 Encounter for antineoplastic immunotherapy: Secondary | ICD-10-CM

## 2010-09-29 LAB — CBC WITH DIFFERENTIAL/PLATELET
Basophils Absolute: 0 10*3/uL (ref 0.0–0.1)
Eosinophils Absolute: 0.2 10*3/uL (ref 0.0–0.5)
HCT: 39.2 % (ref 34.8–46.6)
HGB: 12.7 g/dL (ref 11.6–15.9)
LYMPH%: 20 % (ref 14.0–49.7)
MCV: 93.3 fL (ref 79.5–101.0)
MONO#: 0.7 10*3/uL (ref 0.1–0.9)
MONO%: 10.8 % (ref 0.0–14.0)
NEUT#: 4.2 10*3/uL (ref 1.5–6.5)
Platelets: 147 10*3/uL (ref 145–400)

## 2010-09-29 LAB — LACTATE DEHYDROGENASE: LDH: 197 U/L (ref 94–250)

## 2010-09-29 LAB — COMPREHENSIVE METABOLIC PANEL
CO2: 31 mEq/L (ref 19–32)
Calcium: 10 mg/dL (ref 8.4–10.5)
Chloride: 106 mEq/L (ref 96–112)
Creatinine, Ser: 0.84 mg/dL (ref 0.40–1.20)
Glucose, Bld: 100 mg/dL — ABNORMAL HIGH (ref 70–99)
Total Bilirubin: 0.4 mg/dL (ref 0.3–1.2)
Total Protein: 7 g/dL (ref 6.0–8.3)

## 2010-10-10 NOTE — Procedures (Signed)
BYPASS GRAFT EVALUATION   INDICATION:  Followup left lower extremity bypass graft.   HISTORY:  Diabetes:  No.  Cardiac:  No.  Hypertension:  Yes.  Smoking:  Previous.  Previous Surgery:  Left fem-pop bypass graft on 02/15/2010, left great  toe amputation.   SINGLE LEVEL ARTERIAL EXAM                               RIGHT              LEFT  Brachial:                    176                150  Anterior tibial:             Not detected       Not detected  Posterior tibial:            112                Not detected  Peroneal:                    90                 157  Ankle/brachial index:        0.64               0.89   PREVIOUS ABI:  Date:  RIGHT:  LEFT:   LOWER EXTREMITY BYPASS GRAFT DUPLEX EXAM:   DUPLEX:  Monophasic Doppler waveforms noted throughout the left lower  extremity bypass graft and its native vessels with an increased velocity  of 272 cm/s noted at the left distal anastomosis level.   IMPRESSION:  1. Patent left femoral-popliteal bypass graft with increased velocity      as described above.  2. The bilateral ankle brachial indices suggest moderately decreased      perfusion of the right lower extremity and mildly decreased      perfusion of the left lower extremity, however, monophasic      waveforms were noted.   ___________________________________________  [name], MD   CH/MEDQ  D:  05/19/2010  T:  05/19/2010  Job:  [id number]

## 2010-10-10 NOTE — Assessment & Plan Note (Signed)
OFFICE VISIT   [name], [name]  DOB:  September 18, 1940                                       05/19/2010  ZOXWR#:[id number]   HISTORY OF PRESENT ILLNESS:  This is a 70 year old female status post a  left great toe amputation completed on the March 13, 2010, and also a  left common femoral artery to above-knee popliteal bypass with a  reversed ipsilateral greater saphenous vein on February 15, 2010.  She  presents for 38-month follow-up.  Since then she notes her toe has  completely healed and she is able to ambulate with the help with a  walker.  She additionally is able to walk without the aid of her walker  in her kitchen.  Additionally, she completed her chemotherapy though she  continues rituximab therapy.  She notes her shin ulcer is healing slowly  but it continues to improve.  All of her incisions have healed well at  this point.  She notes occasionally having some intermittent swelling in  her upper leg.  Otherwise, she is at this point asymptomatic.  Her right  leg, she notes, has not given her any symptoms at this point.   Her past medical history, past surgical history, social history, family  history, review of systems and medications and allergies are unchanged  except for the 2 additional surgeries that I noted.   PHYSICAL EXAMINATION:  Today she had a blood pressure of 127/80 with a  of 57, respirations were 12.  Her temperature is 98.3.  general exam:  Well-developed, cachectic in appearance, no apparent distress.  Focused  exam:  Bilateral lower extremities demonstrate on the left side a well-  healed great toe amputation.  The shin:  In the area of pretibial  gangrene that previously was there has contracted significantly and a  shallow ulcer there is about 2 cm by 1 cm wide without any erythema.  There is no tenderness to palpation in this general area.  Her right leg  demonstrates no ulcers or any type of gangrene.  There are no  palpable  pulses on either leg.  Incisions in the left leg are well-healed.  I do  not appreciate any obvious fluid collection.   On noninvasive vascular imaging, she had bilateral ABIs completed.  Right was 0.64, on the left 0.89.  On the duplex in the left leg there  is increased velocity of 272 cm/sec. and monophasic wave form in the  bypass graft and native vessels.   MEDICAL DECISION-MAKING:  This is a 70 year old female who is now 2  months status post a bypass on the left leg and with known bilateral  lower leg peripheral arterial disease.  Ankle-brachial indices  demonstrate improvement in the flow to the left leg.  She has been able  to completely heal her toe amputation and is healing slowly her  pretibial area of gangrene.  This is significantly improved from her  preoperative evaluation.  She has undergone a round of chemotherapy and  also continues to be on rituximab.  My feeling is that though there is  some evidence of elevated velocities in her bypass graft, I do not think  immediate intervention is to her benefit.  I would give her additional 3  months.  If the velocities worsen or her ankle-brachial indices  decrease, at that point I would  perform an endovascular procedure to try  to possibly angioplasty the bypass graft.  Given this patient's general  fragility and multiple comorbidities, any additional surgical  intervention is ill-advised so at this point I would continue local  wound care with the left shin and then give her a chance to heal up  completely before we consider possibly a secondary procedure on her.   Thank you for giving [place] the opportunity to participate in this patient's  care.  We will repeat the ABIs and duplexes in 3 months and at that  point I will consider whether or not to perform a primary assisted  angioplasty on the bypass graft.     [name], MD  Electronically Signed   BLC/MEDQ  D:  05/19/2010  T:  05/19/2010  Job:  [id number]

## 2010-10-10 NOTE — Assessment & Plan Note (Signed)
OFFICE VISIT   [name], [name]  DOB:  Mar 23, 1941                                       03/03/2010  EAVWU#:[id number]   HISTORY OF PRESENT ILLNESS:  This is a 70 year old female that underwent  on February 15, 2010 left common femoral artery to above-knee popliteal  artery bypass with reversed ipsilateral greater saphenous vein.  Since  her previous procedure she has been in rehab undergoing Santyl ointment  treatment to her shin to an area of ulceration and black eschar.  She  notes that this area has improved with the Santyl ointment treatments.  Her left great toe, however continues to be painful and she feels  paresthesias and pins and needles with pain in this toe intermittently  which she feels may be interfering with her rehabilitation.  No fevers  or chills are noted by the patient.  No drainage per se, by the patient  was noted from the foot, no pus and no smell from the left foot.   PAST MEDICAL, SURGICAL, SOCIAL, AND FAMILY HISTORY, REVIEW OF SYSTEMS,  AND MEDICATION/ALLERGIES:  Are unchanged from the previous consultation  note that was completed on February 11, 2010.   PHYSICAL EXAMINATION:  VITAL SIGNS:  Blood pressure 162/77, heart rate  of 64, respirations 24.  FOCUSED EXAMINATION:  Left lower extremity: the groin incision, the mid  thigh and the lower thigh incisions are all softer with no signs of any  cellulitis.  They are all healing well with the Dermabond still adherent  to the incisions, some darkness in the incision line related to the  Dermabond and some postoperative bleeding into the Dermabond, however,  there is no findings of ischemia at these sites.  The tissue has some  firmness, but this is improved from during her hospitalization.  The  foot is nice and warm and I think I feel a weakly palpable dorsalis  pedis.  The shin wound now demonstrates a small amount of granulation in  the superior portion of the shin wound.  The  previous black eschar has  spontaneously debrided at this point and there seems to be some  fibrinous exudate in the base of the wound.  However, this appears  substantially better than when she was admitted in the hospital with  evidence of re-epithelialization of the area peripheral to this  ulcerated area.  Her left great toe, however demonstrates erythema and  some serous drainage from the toe.  The erythema extends to the level of  the phalangeal metatarsal joint space.  There is no streaking erythema  up the foot, however, and the area of black eschar at the tip of this is  unchanged from her discharge, no frank pus was expressed with palpating  this toe.   MEDICAL DECISION MAKING:  This is a 70 year old female who now is  postoperative day #16 status post left common femoral artery to above-  knee popliteal bypass reverse ipsilateral greater saphenous vein.  She  has made great strides in terms of her wound healing, has appropriately  warm foot consistent with a patent bypass.  At this point I suspect she  will not need skin grafting of her anterior shin and with just wound  care may be able to heal her wound here.  However, she is now  demonstrating findings of left great toe cellulitis and  maybe in the  process of developing a deeper infection.  I discussed with the patient  at this point, that  I see no advantage to waiting on the amputation as  previously we were going to possibly complete a tandem procedure in  which she got a skin graft to her shin and also her toe removed.  At  this point it seems unlikely that she needs the shin procedure.  So I  would go ahead and arrange to have her have her left great toe amputated  on this coming Wednesday the 12th.  Meanwhile, I would cover her with  some Augmentin 875 mg/125 mg 1 p.o. b.i.d. for 10 days.  Meanwhile to  help cover for cellulitis and additionally, I would hold her Coumadin,  by report last INR was 2.0, to facilitate  this operation and then  afterwards I will return her back to her rehab facility for what I  anticipate would be improved rehab once the toe is removed.  I discussed  this plan with the patient.  She agrees to proceed forward with such.     Leonides Sake, MD  Electronically Signed   BC/MEDQ  D:  03/03/2010  T:  03/06/2010  Job:  [id number]

## 2010-10-10 NOTE — Assessment & Plan Note (Signed)
OFFICE VISIT   [name], [name]  DOB:  21-Aug-1940                                       04/07/2010  ZOXWR#:[id number]   This is a postop follow-up.   HISTORY OF PRESENT ILLNESS:  This is a 70 year old female status post  recently a toe amputation that was done on March 13, 2010.  This was  done for residual left great toe gangrene and osteomyelitis.  Patient  has healed up her previous surgery well without any further pain and is  able to ambulate within a cast shoe with the help of a walker.  Overall  she feels better than before and now has been discharged from her rehab  facility.   PHYSICAL EXAMINATION:  Blood pressure 138/83, heart rate of 89,  respirations were 12,  saturation 98%.  On focused exam, the right great  toe wound is completely healed at this point.  I removed the remaining  staples.  There was no  wound separation as everything is healed at this  site.  Foot is warm.  I do not feel  palpable pulses in this foot per  se.  All the rest of the incisions on this leg have healed up  successfully.  There is an area of ulceration at the level of the shin  which at this point has limited granulation but by report is smaller  than previous.   MEDICAL DECISION MAKING:  This is a 70 year old female who is now status  post a left common femoral artery above-knee popliteal bypass reversed  ipsilateral greater saphenous vein and also a left great toe amputation.  She is successfully healing up all her wounds on this foot.  She is  healing up her wounds and this level fall warm foot.  This implies she  still has a patent bypass.  In one month she will be at her 104-month mark  and will repeat her ABIs and duplex the bypass graft to see if it is  patent but at this point she has achieved healing of the foot and  managed to maintain her foot which I originally thought was going to  require an amputation.  I have discussed the plan with the patient.   She  is amenable to such and will see her back to 1 month with ABIs and a  duplex of the bypass graft.     Leonides Sake, MD  Electronically Signed   BC/MEDQ  D:  04/07/2010  T:  04/10/2010  Job:  [id number]

## 2010-10-11 ENCOUNTER — Telehealth: Payer: Self-pay | Admitting: Cardiovascular Disease

## 2010-10-11 NOTE — Telephone Encounter (Signed)
Patient's daughter states that her mom was taken off of her BP medication a few months ago due to hypotension in the hospital. She is currently only taking Metoprolol 50 mg twice daily. When she went to see her oncologist, her BP was very elevated 191/98 and he wanted to write her a prescription for Norvasc 5 mg daily. I have updated her medication list.

## 2010-10-11 NOTE — Telephone Encounter (Signed)
Pt daughter has questions about mom's medication

## 2010-10-20 ENCOUNTER — Encounter (INDEPENDENT_AMBULATORY_CARE_PROVIDER_SITE_OTHER): Payer: Medicare Other

## 2010-10-20 ENCOUNTER — Ambulatory Visit (INDEPENDENT_AMBULATORY_CARE_PROVIDER_SITE_OTHER): Payer: Medicare Other | Admitting: Vascular Surgery

## 2010-10-20 DIAGNOSIS — I7092 Chronic total occlusion of artery of the extremities: Secondary | ICD-10-CM

## 2010-10-20 DIAGNOSIS — I739 Peripheral vascular disease, unspecified: Secondary | ICD-10-CM

## 2010-10-20 DIAGNOSIS — Z48812 Encounter for surgical aftercare following surgery on the circulatory system: Secondary | ICD-10-CM

## 2010-10-24 NOTE — Assessment & Plan Note (Signed)
OFFICE VISIT  [name], [name] DOB:  23-Nov-1940                                       10/20/2010 WUJWJ#:[id number]  This is an established patient followup.  HISTORY OF PRESENT ILLNESS:  This is a patient well known to me from a left femoral-popliteal to above knee bypass and since then a stenting of her distal anastomosis due to severe likely intimal hyperplasia at the distal anastomosis versus a sclerotic vein.  At this point the patient notes resolution of her symptomatology in the left leg.  She does have some numbness in the left calf at times, however, her pain has resolved in this side.  She is able to ambulate without any difficulties.  Denies any claudication at this point.  Past medical history, past surgical history, social history, family history, review of systems, medications and allergies are all identical to previous.  PHYSICAL EXAMINATION:  Vital signs:  Today she had a blood pressure 173/102, heart rate of 63, respirations 12, temperature is 98.0. General:  Well-developed, well-nourished, in no apparent distress.  On her targeted exam her left leg had palpable pulses in the femoral.  I do not feel palpable pulses in the popliteal or the pedal.  Incisions are all healed on this side.  There are no ischemic changes in either feet.  NONINVASIVE VASCULAR IMAGING:  I reviewed ABIs which demonstrates on the right side 0.6, on the left 1.03.  The waveforms however are suggestive of moderate to severe peripheral arterial disease so likely calcification.  There was a graft duplex completed that demonstrates elevated velocities proximally and then distally widely patent stent.  MEDICAL DECISION MAKING:  This is a 69 year old patient status post successful left femoral-popliteal bypass with stenting at the distal. On my previous injection study there was absolutely no proximal stenosis so I suspect the elevated velocities are operator  dependent.  The distal stent appears to be open.  At this point the patient is asymptomatic in regards to her right leg even though there is about a 0.11 drop off in the ABI on this side.  No ischemic changes in either feet at this point.  Subsequently at this point I would continue with surveillance at 6 month interval and ABIs with a left graft duplex and will intervene and in primary patency fashion if the patient should develop any further disease.  This patient has extensive cancer history and I strongly do not recommend aggressive surgical intervention as I did not actually think she was going to heal her previous surgery.  I discussed this plan with the patient and she is fine with it at this point.    [name], MD Electronically Signed  BLC/MEDQ  D:  10/20/2010  T:  10/24/2010  Job:  [id number]

## 2010-10-26 NOTE — Procedures (Unsigned)
BYPASS GRAFT EVALUATION  INDICATION:  Follow up peripheral vascular disease.  HISTORY: Diabetes:  No. Cardiac:  MI in 2010. Hypertension:  Yes. Smoking:  Previous. Previous Surgery:  Left femoral to popliteal (above-knee) bypass on 02/15/2010.  Left great toe amputation. Left PTA with stent on 08/28/2010.  SINGLE LEVEL ARTERIAL EXAM                              RIGHT              LEFT Brachial:                    193                167 Anterior tibial:             115                175 Posterior tibial:            112                198 Peroneal: Ankle/brachial index:        0.60               1.03  PREVIOUS ABI:  Date: 08/18/2010  RIGHT:  0.71  LEFT:  0.70  LOWER EXTREMITY BYPASS GRAFT DUPLEX EXAM:  DUPLEX: 1. Elevated velocities which may be suggestive of stenosis noted at     the distal stent anastomosis; however, poorly visualized due to     shadowing from multiple interventions. 2. Elevated velocities at the proximal femoral-to-popliteal bypass     anastomosis, suggesting a 50% to 75% stenosis, as well as the     proximal left profunda femoral artery.    IMPRESSION: 1. Patent left femoral-popliteal (above knee) bypass graft. 2. Patent left stent in the distal superficial femoral artery to     popliteal artery. 3. The left ankle brachial index is improved since previous on     08/18/2010. 4. The right ankle brachial index has slightly decreased since     previous study on 08/18/2010.  ___________________________________________ [name], MD  SH/MEDQ  D:  10/20/2010  T:  10/20/2010  Job:  [id number]

## 2010-11-10 ENCOUNTER — Encounter: Payer: Self-pay | Admitting: Cardiovascular Disease

## 2010-11-10 ENCOUNTER — Ambulatory Visit (INDEPENDENT_AMBULATORY_CARE_PROVIDER_SITE_OTHER): Payer: Medicare Other | Admitting: Cardiovascular Disease

## 2010-11-10 VITALS — BP 141/80 | HR 53 | Ht 66.0 in | Wt 140.0 lb

## 2010-11-10 DIAGNOSIS — I10 Essential (primary) hypertension: Secondary | ICD-10-CM

## 2010-11-10 DIAGNOSIS — I739 Peripheral vascular disease, unspecified: Secondary | ICD-10-CM

## 2010-11-10 DIAGNOSIS — I2510 Atherosclerotic heart disease of native coronary artery without angina pectoris: Secondary | ICD-10-CM

## 2010-11-10 MED ORDER — AMLODIPINE BESYLATE 5 MG PO TABS: 5.0000 mg | ORAL_TABLET | Freq: Every day | ORAL | Status: DC

## 2010-11-10 NOTE — Assessment & Plan Note (Signed)
Stable. No changes. Continue ASA, Plavix, statin and beta blocker.

## 2010-11-10 NOTE — Progress Notes (Signed)
History of Present Illness:69 yo WF with history of Non-Hodgkins lymphoma, tobacco abuse, HTN, hyperlipidemia, PAD and CAD here today for follow up. She was admitted to [facility] with  inferior STEMI 03/20/09 at which time she was found to have a total occlusion of the proximal RCA at the area of a prior stent. A drug eluting  stent was deployed in the proximal RCA. Her LV gram and echo showed basal to mid inferior and posterior wall hypokinesis with overall preserved EF. During admission she complained of bilateral lower ext pain with ambulation and weakness with ambulation.  We ordered lower extremity arterial dopplers that showed total occlusion of both SFA at the origin with ABI of 0.60 bilaterally. There was also high grade right common femoral artery stenosis. She was noted to have uncontrolled HTN and unequal blood pressures in her arms. A CT angiogram of the chest/neck/abdomen was ordered.  This showed stenosis of the left subclavian artery, mildly dilated aortic root, mild disease in bilateral renal arteries. There is diffuse vascular disease including the mesenteric vessels. Unfortunately, the CT scan also showed a right paraspinal soft tissue mass as well as diffuse retroperitoneal and mesenteric adenopathy. She was then diagnosed with Stage III Non-Hodgkins lymphoma and is undergoing treatment under the guidance of Dr. Jethro Bolus. She has unfortunately also developed a DVT and is on coumadin therapy. In the Fall of 2011,  she hit her left leg and developed an ulcer over the anterior aspect of the left leg, just above the ankle. She was unable to heal this area. Dr. Imogene Burn performed left common femoral artery to left popliteal artery bypass on 02/15/10. She then had her left great toe ampuated on 03/14/10 by Dr. Imogene Burn. In April she had placement of a stent in the distal portion of the bypass at the anastamosis with the popliteal artery per Dr. Imogene Burn. Dr. Imogene Burn is following her vascular disease. Recent non-invasive  studies May 2012 with patent bypass graft in left leg in [facility] office. She is finishing up her chemotherapy. Her NHL is felt to be in remission.   She is here today for cardiac follow up. She has been feeling great. She has gained 23 pounds which is great. She has had no chest pain, SOB, palpitations, near syncope or syncope. She has been taking all of her medications.    Past Medical History  Diagnosis Date  . Atherosclerosis of native arteries of the extremities with ulceration   . Lymphoma, non Hodgkin's   . Hypertension   . CAD (coronary artery disease)   . Myocardial infarction   . DVT (deep venous thrombosis)     Past Surgical History  Procedure Date  . Coronary angioplasty with stent placement   . Femoral-popliteal bypass graft 02/15/10    Left CFA to above knee popliteal BPG by Dr. Leonides Sake  . Toe amputation 03/13/10    Left Great toe by Dr. Leonides Sake  . Angioplasty / stenting femoral 08/28/10    Left distal anastomosis, angioplasty w/stent by Dr. Leonides Sake    Current Outpatient Prescriptions  Medication Sig Dispense Refill  . acetaminophen (TYLENOL) 500 MG tablet Take 500 mg by mouth every 6 (six) hours as needed.        Marland Kitchen aspirin 81 MG tablet Take 81 mg by mouth daily.        . bisacodyl (DULCOLAX) 10 MG suppository Place 10 mg rectally as needed.        . clopidogrel (PLAVIX) 75 MG tablet Take  75 mg by mouth daily.        . magnesium oxide (MAG-OX) 400 MG tablet Take 400 mg by mouth 2 (two) times daily.        . metoprolol (LOPRESSOR) 50 MG tablet Take 50 mg by mouth 2 (two) times daily.        . Multiple Vitamins-Minerals (MULTIVITAMIN WITH MINERALS) tablet Take 1 tablet by mouth daily.        . nitroGLYCERIN (NITROSTAT) 0.4 MG SL tablet Place 0.4 mg under the tongue every 5 (five) minutes as needed.        . rosuvastatin (CRESTOR) 40 MG tablet Take 40 mg by mouth daily.        Marland Kitchen DISCONTD: amLODipine (NORVASC) 5 MG tablet Take 5 mg by mouth daily.        Marland Kitchen  DISCONTD: collagenase (SANTYL) ointment Apply 1 application topically daily.        Marland Kitchen DISCONTD: docusate sodium (COLACE) 100 MG capsule Take 100 mg by mouth 2 (two) times daily.        Marland Kitchen DISCONTD: lisinopril (PRINIVIL,ZESTRIL) 5 MG tablet Take 5 mg by mouth daily.        Marland Kitchen DISCONTD: oxyCODONE-acetaminophen (PERCOCET) 5-325 MG per tablet Take 1 tablet by mouth every 4 (four) hours as needed.        Marland Kitchen DISCONTD: polyethylene glycol (MIRALAX / GLYCOLAX) packet Take 17 g by mouth daily.        Marland Kitchen DISCONTD: traMADol (ULTRAM-ER) 100 MG 24 hr tablet Take 100 mg by mouth daily as needed.        Marland Kitchen DISCONTD: warfarin (COUMADIN) 2 MG tablet Take 2 mg by mouth as directed.          No Known Allergies  History   Social History  . Marital Status: Divorced    Spouse Name: N/A    Number of Children: N/A  . Years of Education: N/A   Occupational History  . Not on file.   Social History Main Topics  . Smoking status: Former Games developer  . Smokeless tobacco: Not on file  . Alcohol Use: No  . Drug Use: Yes  . Sexually Active: Not on file   Other Topics Concern  . Not on file   Social History Narrative  . No narrative on file    No family history on file.  Review of Systems:  As stated in the HPI and otherwise negative.   BP 141/80  Pulse 53  Ht 5\' 6"  (1.676 m)  Wt 140 lb (63.504 kg)  BMI 22.60 kg/m2  Physical Examination: General: Well developed, well nourished, NAD HEENT: OP clear, mucus membranes moist SKIN: warm, dry. No rashes. Neuro: No focal deficits Musculoskeletal: Muscle strength 5/5 all ext Psychiatric: Mood and affect normal Neck: No JVD, no carotid bruits, no thyromegaly, no lymphadenopathy. Lungs:Clear bilaterally, no wheezes, rhonci, crackles Cardiovascular: Regular rate and rhythm. No murmurs, gallops or rubs. Abdomen:Soft. Bowel sounds present. Non-tender.  Extremities: No lower extremity edema. Pulses are non-palpable in both LE. Left shin wound healed.

## 2010-11-10 NOTE — Patient Instructions (Signed)
Your physician recommends that you schedule a follow-up appointment in: 6 months  

## 2010-11-10 NOTE — Assessment & Plan Note (Signed)
Stable post left femoral popliteal bypass, followed by Dr. Imogene Burn with [facility].

## 2010-11-10 NOTE — Assessment & Plan Note (Signed)
Will add Norvasc 5 mg po Qdaily.

## 2010-11-20 ENCOUNTER — Other Ambulatory Visit: Payer: Self-pay | Admitting: *Deleted

## 2010-11-20 MED ORDER — CLOPIDOGREL BISULFATE 75 MG PO TABS: 75.0000 mg | ORAL_TABLET | Freq: Every day | ORAL | Status: DC

## 2010-11-24 ENCOUNTER — Encounter (HOSPITAL_COMMUNITY): Payer: Self-pay

## 2010-11-24 ENCOUNTER — Ambulatory Visit (HOSPITAL_COMMUNITY)
Admission: RE | Admit: 2010-11-24 | Discharge: 2010-11-24 | Disposition: A | Discharge status: Home / Self Care | Payer: Medicare Other | Source: Ambulatory Visit | Attending: Oncology | Admitting: Oncology

## 2010-11-24 DIAGNOSIS — N135 Crossing vessel and stricture of ureter without hydronephrosis: Secondary | ICD-10-CM | POA: Insufficient documentation

## 2010-11-24 DIAGNOSIS — C8590 Non-Hodgkin lymphoma, unspecified, unspecified site: Secondary | ICD-10-CM

## 2010-11-24 DIAGNOSIS — I712 Thoracic aortic aneurysm, without rupture, unspecified: Secondary | ICD-10-CM | POA: Insufficient documentation

## 2010-11-24 DIAGNOSIS — C8589 Other specified types of non-Hodgkin lymphoma, extranodal and solid organ sites: Secondary | ICD-10-CM | POA: Insufficient documentation

## 2010-11-24 DIAGNOSIS — I2510 Atherosclerotic heart disease of native coronary artery without angina pectoris: Secondary | ICD-10-CM | POA: Insufficient documentation

## 2010-11-24 MED ORDER — IOHEXOL 300 MG/ML  SOLN
100.0000 mL | Freq: Once | INTRAMUSCULAR | Status: AC | PRN
  Administered 2010-11-24: 100 mL via INTRAVENOUS

## 2010-12-01 ENCOUNTER — Other Ambulatory Visit: Payer: Self-pay | Admitting: Oncology

## 2010-12-01 ENCOUNTER — Encounter (HOSPITAL_BASED_OUTPATIENT_CLINIC_OR_DEPARTMENT_OTHER): Payer: Medicare Other | Admitting: Oncology

## 2010-12-01 DIAGNOSIS — Z5111 Encounter for antineoplastic chemotherapy: Secondary | ICD-10-CM

## 2010-12-01 DIAGNOSIS — Z5112 Encounter for antineoplastic immunotherapy: Secondary | ICD-10-CM

## 2010-12-01 DIAGNOSIS — I10 Essential (primary) hypertension: Secondary | ICD-10-CM

## 2010-12-01 DIAGNOSIS — Z23 Encounter for immunization: Secondary | ICD-10-CM

## 2010-12-01 DIAGNOSIS — C8296 Follicular lymphoma, unspecified, intrapelvic lymph nodes: Secondary | ICD-10-CM

## 2010-12-01 DIAGNOSIS — Z86718 Personal history of other venous thrombosis and embolism: Secondary | ICD-10-CM

## 2010-12-01 LAB — CBC WITH DIFFERENTIAL/PLATELET
Basophils Absolute: 0 10*3/uL (ref 0.0–0.1)
Eosinophils Absolute: 0.2 10*3/uL (ref 0.0–0.5)
HGB: 13.4 g/dL (ref 11.6–15.9)
MCV: 93.2 fL (ref 79.5–101.0)
MONO#: 0.7 10*3/uL (ref 0.1–0.9)
NEUT#: 4.5 10*3/uL (ref 1.5–6.5)
RDW: 13.8 % (ref 11.2–14.5)
WBC: 6.6 10*3/uL (ref 3.9–10.3)
lymph#: 1.2 10*3/uL (ref 0.9–3.3)
nRBC: 0 % (ref 0–0)

## 2010-12-01 LAB — COMPREHENSIVE METABOLIC PANEL
ALT: 22 U/L (ref 0–35)
Albumin: 4.2 g/dL (ref 3.5–5.2)
CO2: 27 mEq/L (ref 19–32)
Chloride: 106 mEq/L (ref 96–112)
Glucose, Bld: 119 mg/dL — ABNORMAL HIGH (ref 70–99)
Potassium: 4.3 mEq/L (ref 3.5–5.3)
Sodium: 144 mEq/L (ref 135–145)
Total Protein: 6.2 g/dL (ref 6.0–8.3)

## 2010-12-01 LAB — LACTATE DEHYDROGENASE: LDH: 193 U/L (ref 94–250)

## 2011-02-02 ENCOUNTER — Other Ambulatory Visit: Payer: Self-pay | Admitting: Oncology

## 2011-02-02 ENCOUNTER — Encounter (HOSPITAL_BASED_OUTPATIENT_CLINIC_OR_DEPARTMENT_OTHER): Payer: Medicare Other | Admitting: Oncology

## 2011-02-02 DIAGNOSIS — C8296 Follicular lymphoma, unspecified, intrapelvic lymph nodes: Secondary | ICD-10-CM

## 2011-02-02 DIAGNOSIS — Z86718 Personal history of other venous thrombosis and embolism: Secondary | ICD-10-CM

## 2011-02-02 DIAGNOSIS — Z23 Encounter for immunization: Secondary | ICD-10-CM

## 2011-02-02 DIAGNOSIS — Z5112 Encounter for antineoplastic immunotherapy: Secondary | ICD-10-CM

## 2011-02-02 LAB — COMPREHENSIVE METABOLIC PANEL
ALT: 19 U/L (ref 0–35)
AST: 29 U/L (ref 0–37)
CO2: 26 mEq/L (ref 19–32)
Chloride: 106 mEq/L (ref 96–112)
Creatinine, Ser: 0.88 mg/dL (ref 0.50–1.10)
Sodium: 144 mEq/L (ref 135–145)
Total Bilirubin: 0.3 mg/dL (ref 0.3–1.2)
Total Protein: 6.9 g/dL (ref 6.0–8.3)

## 2011-02-02 LAB — CBC WITH DIFFERENTIAL/PLATELET
BASO%: 0.4 % (ref 0.0–2.0)
EOS%: 2.3 % (ref 0.0–7.0)
LYMPH%: 17.7 % (ref 14.0–49.7)
MCH: 31.9 pg (ref 25.1–34.0)
MCHC: 34.4 g/dL (ref 31.5–36.0)
MONO#: 0.5 10*3/uL (ref 0.1–0.9)
NEUT%: 72.4 % (ref 38.4–76.8)
RBC: 4.21 10*6/uL (ref 3.70–5.45)
WBC: 6.9 10*3/uL (ref 3.9–10.3)
lymph#: 1.2 10*3/uL (ref 0.9–3.3)

## 2011-02-02 LAB — LACTATE DEHYDROGENASE: LDH: 233 U/L (ref 94–250)

## 2011-02-28 ENCOUNTER — Encounter: Payer: Self-pay | Admitting: Nurse Practitioner

## 2011-03-09 ENCOUNTER — Encounter: Payer: Self-pay | Admitting: Vascular Surgery

## 2011-03-13 ENCOUNTER — Encounter: Payer: Self-pay | Admitting: *Deleted

## 2011-03-20 ENCOUNTER — Encounter: Payer: Self-pay | Admitting: Oncology

## 2011-03-20 ENCOUNTER — Other Ambulatory Visit: Payer: Self-pay | Admitting: Oncology

## 2011-03-20 DIAGNOSIS — C8290 Follicular lymphoma, unspecified, unspecified site: Secondary | ICD-10-CM

## 2011-03-20 DIAGNOSIS — Z8572 Personal history of non-Hodgkin lymphomas: Secondary | ICD-10-CM | POA: Insufficient documentation

## 2011-04-04 ENCOUNTER — Encounter (HOSPITAL_COMMUNITY): Payer: Self-pay | Admitting: Emergency Medicine

## 2011-04-04 ENCOUNTER — Emergency Department (HOSPITAL_COMMUNITY): Payer: Medicare Other

## 2011-04-04 ENCOUNTER — Inpatient Hospital Stay (HOSPITAL_COMMUNITY)
Admission: EM | Admit: 2011-04-04 | Discharge: 2011-04-11 | DRG: 480 | Disposition: A | Discharge status: Skilled Nursing Facility | Payer: Medicare Other | Attending: Family Medicine | Admitting: Family Medicine

## 2011-04-04 ENCOUNTER — Other Ambulatory Visit: Payer: Self-pay | Admitting: Cardiovascular Disease

## 2011-04-04 DIAGNOSIS — R0600 Dyspnea, unspecified: Secondary | ICD-10-CM | POA: Diagnosis not present

## 2011-04-04 DIAGNOSIS — Z86718 Personal history of other venous thrombosis and embolism: Secondary | ICD-10-CM

## 2011-04-04 DIAGNOSIS — G9340 Encephalopathy, unspecified: Secondary | ICD-10-CM | POA: Diagnosis not present

## 2011-04-04 DIAGNOSIS — S72143A Displaced intertrochanteric fracture of unspecified femur, initial encounter for closed fracture: Principal | ICD-10-CM | POA: Diagnosis present

## 2011-04-04 DIAGNOSIS — C8299 Follicular lymphoma, unspecified, extranodal and solid organ sites: Secondary | ICD-10-CM | POA: Diagnosis present

## 2011-04-04 DIAGNOSIS — Z9861 Coronary angioplasty status: Secondary | ICD-10-CM

## 2011-04-04 DIAGNOSIS — W010XXA Fall on same level from slipping, tripping and stumbling without subsequent striking against object, initial encounter: Secondary | ICD-10-CM | POA: Diagnosis present

## 2011-04-04 DIAGNOSIS — E785 Hyperlipidemia, unspecified: Secondary | ICD-10-CM | POA: Diagnosis present

## 2011-04-04 DIAGNOSIS — I2510 Atherosclerotic heart disease of native coronary artery without angina pectoris: Secondary | ICD-10-CM | POA: Diagnosis present

## 2011-04-04 DIAGNOSIS — IMO0001 Reserved for inherently not codable concepts without codable children: Secondary | ICD-10-CM

## 2011-04-04 DIAGNOSIS — Z8572 Personal history of non-Hodgkin lymphomas: Secondary | ICD-10-CM | POA: Diagnosis present

## 2011-04-04 DIAGNOSIS — R0902 Hypoxemia: Secondary | ICD-10-CM | POA: Diagnosis not present

## 2011-04-04 DIAGNOSIS — D696 Thrombocytopenia, unspecified: Secondary | ICD-10-CM | POA: Diagnosis not present

## 2011-04-04 DIAGNOSIS — Z7982 Long term (current) use of aspirin: Secondary | ICD-10-CM

## 2011-04-04 DIAGNOSIS — Z87891 Personal history of nicotine dependence: Secondary | ICD-10-CM

## 2011-04-04 DIAGNOSIS — I70209 Unspecified atherosclerosis of native arteries of extremities, unspecified extremity: Secondary | ICD-10-CM | POA: Diagnosis present

## 2011-04-04 DIAGNOSIS — D62 Acute posthemorrhagic anemia: Secondary | ICD-10-CM | POA: Diagnosis not present

## 2011-04-04 DIAGNOSIS — I739 Peripheral vascular disease, unspecified: Secondary | ICD-10-CM | POA: Diagnosis present

## 2011-04-04 DIAGNOSIS — Z7902 Long term (current) use of antithrombotics/antiplatelets: Secondary | ICD-10-CM

## 2011-04-04 DIAGNOSIS — Z79899 Other long term (current) drug therapy: Secondary | ICD-10-CM

## 2011-04-04 DIAGNOSIS — I10 Essential (primary) hypertension: Secondary | ICD-10-CM | POA: Diagnosis present

## 2011-04-04 DIAGNOSIS — Y998 Other external cause status: Secondary | ICD-10-CM

## 2011-04-04 DIAGNOSIS — S72009A Fracture of unspecified part of neck of unspecified femur, initial encounter for closed fracture: Secondary | ICD-10-CM | POA: Diagnosis present

## 2011-04-04 DIAGNOSIS — S98119A Complete traumatic amputation of unspecified great toe, initial encounter: Secondary | ICD-10-CM

## 2011-04-04 DIAGNOSIS — D649 Anemia, unspecified: Secondary | ICD-10-CM | POA: Diagnosis present

## 2011-04-04 DIAGNOSIS — R5381 Other malaise: Secondary | ICD-10-CM | POA: Diagnosis present

## 2011-04-04 DIAGNOSIS — I82409 Acute embolism and thrombosis of unspecified deep veins of unspecified lower extremity: Secondary | ICD-10-CM | POA: Diagnosis present

## 2011-04-04 DIAGNOSIS — I519 Heart disease, unspecified: Secondary | ICD-10-CM | POA: Diagnosis present

## 2011-04-04 DIAGNOSIS — I252 Old myocardial infarction: Secondary | ICD-10-CM

## 2011-04-04 DIAGNOSIS — Z66 Do not resuscitate: Secondary | ICD-10-CM | POA: Diagnosis present

## 2011-04-04 DIAGNOSIS — Y92009 Unspecified place in unspecified non-institutional (private) residence as the place of occurrence of the external cause: Secondary | ICD-10-CM

## 2011-04-04 DIAGNOSIS — R410 Disorientation, unspecified: Secondary | ICD-10-CM | POA: Diagnosis not present

## 2011-04-04 MED ORDER — ONDANSETRON HCL 4 MG/2ML IJ SOLN
4.0000 mg | Freq: Once | INTRAMUSCULAR | Status: AC
  Administered 2011-04-05: 4 mg via INTRAVENOUS
  Filled 2011-04-04: qty 2

## 2011-04-04 MED ORDER — FENTANYL CITRATE 0.05 MG/ML IJ SOLN
50.0000 ug | Freq: Once | INTRAMUSCULAR | Status: AC
  Administered 2011-04-05: 50 ug via INTRAVENOUS
  Filled 2011-04-04: qty 2

## 2011-04-04 NOTE — ED Notes (Signed)
[id number]<AV> Expected date:04/04/11<BR> Expected time:10:00 PM<BR> Means of arrival:Ambulance<BR> Comments:<BR> EMS 120 GC, 77 yof fall w L side hip pain

## 2011-04-04 NOTE — ED Notes (Signed)
Paged iv team to get iv on patient.   ems tried 4 times and i tried 2 times.

## 2011-04-04 NOTE — ED Notes (Signed)
See arrival info. 

## 2011-04-05 ENCOUNTER — Other Ambulatory Visit: Payer: Self-pay

## 2011-04-05 ENCOUNTER — Encounter (HOSPITAL_COMMUNITY): Payer: Self-pay | Admitting: Family Medicine

## 2011-04-05 ENCOUNTER — Encounter (HOSPITAL_COMMUNITY)
Admission: EM | Disposition: A | Discharge status: Skilled Nursing Facility | Payer: Self-pay | Source: Home / Self Care | Attending: Internal Medicine

## 2011-04-05 ENCOUNTER — Telehealth: Payer: Self-pay | Admitting: Oncology

## 2011-04-05 ENCOUNTER — Encounter (HOSPITAL_COMMUNITY): Payer: Self-pay | Admitting: Anesthesiology

## 2011-04-05 ENCOUNTER — Inpatient Hospital Stay (HOSPITAL_COMMUNITY): Payer: Medicare Other

## 2011-04-05 ENCOUNTER — Inpatient Hospital Stay (HOSPITAL_COMMUNITY): Payer: Medicare Other | Admitting: Anesthesiology

## 2011-04-05 DIAGNOSIS — S72009A Fracture of unspecified part of neck of unspecified femur, initial encounter for closed fracture: Secondary | ICD-10-CM | POA: Diagnosis present

## 2011-04-05 HISTORY — PX: FEMUR IM NAIL: SHX1597

## 2011-04-05 LAB — URINALYSIS, ROUTINE W REFLEX MICROSCOPIC
Bilirubin Urine: NEGATIVE
Glucose, UA: NEGATIVE mg/dL
pH: 7.5 (ref 5.0–8.0)

## 2011-04-05 LAB — BASIC METABOLIC PANEL
BUN: 17 mg/dL (ref 6–23)
Creatinine, Ser: 0.76 mg/dL (ref 0.50–1.10)
GFR calc non Af Amer: 83 mL/min — ABNORMAL LOW (ref >90)
Glucose, Bld: 131 mg/dL — ABNORMAL HIGH (ref 70–99)
Potassium: 3.7 mEq/L (ref 3.5–5.1)

## 2011-04-05 LAB — DIFFERENTIAL
Basophils Relative: 0 % (ref 0–1)
Eosinophils Absolute: 0.1 10*3/uL (ref 0.0–0.7)
Monocytes Absolute: 0.7 10*3/uL (ref 0.1–1.0)
Monocytes Relative: 6 % (ref 3–12)
Neutrophils Relative %: 87 % — ABNORMAL HIGH (ref 43–77)

## 2011-04-05 LAB — CBC
Hemoglobin: 13.8 g/dL (ref 12.0–15.0)
MCH: 31.7 pg (ref 26.0–34.0)
MCHC: 33.3 g/dL (ref 30.0–36.0)
RDW: 13.4 % (ref 11.5–15.5)

## 2011-04-05 LAB — PROTIME-INR: INR: 0.88 (ref 0.00–1.49)

## 2011-04-05 SURGERY — INSERTION, INTRAMEDULLARY ROD, FEMUR
Anesthesia: General | Site: Hip | Laterality: Left | Wound class: Clean

## 2011-04-05 MED ORDER — SODIUM CHLORIDE 0.9 % IR SOLN
Status: DC | PRN
  Administered 2011-04-05: 1000 mL

## 2011-04-05 MED ORDER — PROMETHAZINE HCL 25 MG/ML IJ SOLN
6.2500 mg | INTRAMUSCULAR | Status: DC | PRN
  Administered 2011-04-05: 6.25 mg via INTRAVENOUS

## 2011-04-05 MED ORDER — POTASSIUM CHLORIDE IN NACL 20-0.9 MEQ/L-% IV SOLN
INTRAVENOUS | Status: DC
  Administered 2011-04-05 – 2011-04-06 (×3): via INTRAVENOUS
  Filled 2011-04-05 (×4): qty 1000

## 2011-04-05 MED ORDER — FENTANYL CITRATE 0.05 MG/ML IJ SOLN
INTRAMUSCULAR | Status: AC
  Filled 2011-04-05: qty 2

## 2011-04-05 MED ORDER — FENTANYL CITRATE 0.05 MG/ML IJ SOLN
INTRAMUSCULAR | Status: DC | PRN
  Administered 2011-04-05 (×2): 25 ug via INTRAVENOUS
  Administered 2011-04-05: 50 ug via INTRAVENOUS

## 2011-04-05 MED ORDER — ONDANSETRON HCL 4 MG/2ML IJ SOLN: 4.0000 mg | Freq: Four times a day (QID) | INTRAMUSCULAR | Status: DC | PRN

## 2011-04-05 MED ORDER — MORPHINE SULFATE 2 MG/ML IJ SOLN
INTRAMUSCULAR | Status: AC
  Administered 2011-04-05: 2 mg via INTRAVENOUS
  Filled 2011-04-05: qty 1

## 2011-04-05 MED ORDER — HYDROMORPHONE HCL PF 2 MG/ML IJ SOLN
INTRAMUSCULAR | Status: AC
  Filled 2011-04-05: qty 1

## 2011-04-05 MED ORDER — MAGNESIUM OXIDE 400 MG PO TABS
400.0000 mg | ORAL_TABLET | Freq: Two times a day (BID) | ORAL | Status: DC
  Administered 2011-04-06 – 2011-04-11 (×11): 400 mg via ORAL
  Filled 2011-04-05 (×14): qty 1

## 2011-04-05 MED ORDER — LIDOCAINE HCL (CARDIAC) 20 MG/ML IV SOLN
INTRAVENOUS | Status: DC | PRN
  Administered 2011-04-05: 60 mg via INTRAVENOUS

## 2011-04-05 MED ORDER — METOPROLOL TARTRATE 50 MG PO TABS
50.0000 mg | ORAL_TABLET | Freq: Two times a day (BID) | ORAL | Status: DC
  Administered 2011-04-06 – 2011-04-09 (×7): 50 mg via ORAL
  Filled 2011-04-05 (×12): qty 1

## 2011-04-05 MED ORDER — PROMETHAZINE HCL 25 MG/ML IJ SOLN
INTRAMUSCULAR | Status: AC
  Filled 2011-04-05: qty 1

## 2011-04-05 MED ORDER — ONDANSETRON HCL 4 MG/2ML IJ SOLN
INTRAMUSCULAR | Status: DC | PRN
  Administered 2011-04-05: 4 mg via INTRAVENOUS

## 2011-04-05 MED ORDER — SENNOSIDES-DOCUSATE SODIUM 8.6-50 MG PO TABS
1.0000 | ORAL_TABLET | Freq: Every day | ORAL | Status: DC | PRN
  Filled 2011-04-05: qty 1

## 2011-04-05 MED ORDER — SUCCINYLCHOLINE CHLORIDE 20 MG/ML IJ SOLN
INTRAMUSCULAR | Status: DC | PRN
  Administered 2011-04-05: 100 mg via INTRAVENOUS

## 2011-04-05 MED ORDER — ONDANSETRON HCL 4 MG PO TABS: 4.0000 mg | ORAL_TABLET | Freq: Four times a day (QID) | ORAL | Status: DC | PRN

## 2011-04-05 MED ORDER — LACTATED RINGERS IV SOLN
INTRAVENOUS | Status: DC | PRN
  Administered 2011-04-05: 10:00:00 via INTRAVENOUS

## 2011-04-05 MED ORDER — AMLODIPINE BESYLATE 5 MG PO TABS
5.0000 mg | ORAL_TABLET | Freq: Every day | ORAL | Status: DC
  Administered 2011-04-06 – 2011-04-09 (×4): 5 mg via ORAL
  Filled 2011-04-05 (×6): qty 1

## 2011-04-05 MED ORDER — ASPIRIN 81 MG PO CHEW
81.0000 mg | CHEWABLE_TABLET | Freq: Every day | ORAL | Status: DC
  Administered 2011-04-06 – 2011-04-11 (×6): 81 mg via ORAL
  Filled 2011-04-05 (×8): qty 1

## 2011-04-05 MED ORDER — ENOXAPARIN SODIUM 40 MG/0.4ML ~~LOC~~ SOLN
40.0000 mg | SUBCUTANEOUS | Status: DC
  Administered 2011-04-05 – 2011-04-10 (×6): 40 mg via SUBCUTANEOUS
  Filled 2011-04-05 (×7): qty 0.4

## 2011-04-05 MED ORDER — LABETALOL HCL 5 MG/ML IV SOLN
INTRAVENOUS | Status: DC | PRN
  Administered 2011-04-05 (×2): 5 mg via INTRAVENOUS

## 2011-04-05 MED ORDER — HYDROCODONE-ACETAMINOPHEN 5-325 MG PO TABS
1.0000 | ORAL_TABLET | ORAL | Status: DC | PRN
  Administered 2011-04-05 – 2011-04-06 (×3): 2 via ORAL
  Administered 2011-04-10 – 2011-04-11 (×5): 1 via ORAL
  Filled 2011-04-05: qty 2
  Filled 2011-04-05 (×3): qty 1
  Filled 2011-04-05 (×2): qty 2
  Filled 2011-04-05 (×3): qty 1

## 2011-04-05 MED ORDER — ACETAMINOPHEN 10 MG/ML IV SOLN
INTRAVENOUS | Status: AC
  Filled 2011-04-05: qty 100

## 2011-04-05 MED ORDER — CEFAZOLIN SODIUM 1-5 GM-% IV SOLN
INTRAVENOUS | Status: AC
  Filled 2011-04-05: qty 50

## 2011-04-05 MED ORDER — ROSUVASTATIN CALCIUM 40 MG PO TABS
40.0000 mg | ORAL_TABLET | Freq: Every day | ORAL | Status: DC
  Administered 2011-04-06 – 2011-04-11 (×6): 40 mg via ORAL
  Filled 2011-04-05 (×8): qty 1

## 2011-04-05 MED ORDER — ACETAMINOPHEN 10 MG/ML IV SOLN
INTRAVENOUS | Status: DC | PRN
  Administered 2011-04-05: 1000 mg via INTRAVENOUS

## 2011-04-05 MED ORDER — CISATRACURIUM BESYLATE 2 MG/ML IV SOLN
INTRAVENOUS | Status: DC | PRN
  Administered 2011-04-05: 4 mg via INTRAVENOUS

## 2011-04-05 MED ORDER — NITROGLYCERIN 0.4 MG SL SUBL: 0.4000 mg | SUBLINGUAL_TABLET | SUBLINGUAL | Status: DC | PRN

## 2011-04-05 MED ORDER — PROPOFOL 10 MG/ML IV EMUL
INTRAVENOUS | Status: DC | PRN
  Administered 2011-04-05: 150 mg via INTRAVENOUS

## 2011-04-05 MED ORDER — MORPHINE SULFATE 2 MG/ML IJ SOLN: 2.0000 mg | INTRAMUSCULAR | Status: DC | PRN

## 2011-04-05 MED ORDER — CEFAZOLIN SODIUM 1-5 GM-% IV SOLN
1.0000 g | INTRAVENOUS | Status: AC
  Administered 2011-04-05: 1 g via INTRAVENOUS
  Filled 2011-04-05: qty 50

## 2011-04-05 MED ORDER — HYDROMORPHONE HCL PF 1 MG/ML IJ SOLN
0.2500 mg | INTRAMUSCULAR | Status: DC | PRN
  Administered 2011-04-05 (×2): 0.5 mg via INTRAVENOUS

## 2011-04-05 SURGICAL SUPPLY — 34 items
BAG SPEC THK2 15X12 ZIP CLS (MISCELLANEOUS) ×1
BAG ZIPLOCK 12X15 (MISCELLANEOUS) ×2 IMPLANT
BANDAGE GAUZE ELAST BULKY 4 IN (GAUZE/BANDAGES/DRESSINGS) ×2 IMPLANT
CANISTER SUCTION 2500CC (MISCELLANEOUS) ×2 IMPLANT
CHLORAPREP W/TINT 26ML (MISCELLANEOUS) ×2 IMPLANT
CLOTH BEACON ORANGE TIMEOUT ST (SAFETY) ×2 IMPLANT
DRAPE STERI IOBAN 125X83 (DRAPES) ×2 IMPLANT
DRSG EMULSION OIL 3X16 NADH (GAUZE/BANDAGES/DRESSINGS) IMPLANT
DRSG MEPILEX BORDER 4X4 (GAUZE/BANDAGES/DRESSINGS) ×4 IMPLANT
DRSG PAD ABDOMINAL 8X10 ST (GAUZE/BANDAGES/DRESSINGS) IMPLANT
ELECT REM PT RETURN 9FT ADLT (ELECTROSURGICAL) ×2
ELECTRODE REM PT RTRN 9FT ADLT (ELECTROSURGICAL) ×1 IMPLANT
GAUZE SPONGE 2X2 8PLY STRL LF (GAUZE/BANDAGES/DRESSINGS) ×2 IMPLANT
GLOVE BIOGEL PI IND STRL 8 (GLOVE) ×1 IMPLANT
GLOVE BIOGEL PI INDICATOR 8 (GLOVE) ×1
GUIDEPIN 3.2X17.5 THRD DISP (PIN) ×2 IMPLANT
GUIDEWIRE BALL NOSE 100CM (WIRE) ×2 IMPLANT
HIP FRAC NAIL LAG SCR 10.5X100 (Orthopedic Implant) ×1 IMPLANT
HIP FRAC NAIL LEFT 11X360MM (Orthopedic Implant) ×2 IMPLANT
NAIL HIP FRAC LEFT 11X360MM (Orthopedic Implant) ×1 IMPLANT
NS IRRIG 1000ML POUR BTL (IV SOLUTION) ×2 IMPLANT
PACK GENERAL/GYN (CUSTOM PROCEDURE TRAY) ×2 IMPLANT
PAD CAST 4YDX4 CTTN HI CHSV (CAST SUPPLIES) ×1 IMPLANT
PADDING CAST COTTON 4X4 STRL (CAST SUPPLIES) ×2
POSITIONER SURGICAL ARM (MISCELLANEOUS) ×4 IMPLANT
SCREW CANN THRD AFF 10.5X100 (Orthopedic Implant) ×1 IMPLANT
SPONGE GAUZE 2X2 STER 10/PKG (GAUZE/BANDAGES/DRESSINGS) ×2
SPONGE GAUZE 4X4 12PLY (GAUZE/BANDAGES/DRESSINGS) ×2 IMPLANT
SPONGE LAP 18X18 X RAY DECT (DISPOSABLE) IMPLANT
STAPLER SKIN PROX WIDE 3.9 (STAPLE) ×2 IMPLANT
STRIP CLOSURE SKIN 1/2X4 (GAUZE/BANDAGES/DRESSINGS) IMPLANT
SUT VIC AB 0 CT1 27 (SUTURE) ×2
SUT VIC AB 0 CT1 27XBRD ANTBC (SUTURE) ×1 IMPLANT
WATER STERILE IRR 1500ML POUR (IV SOLUTION) IMPLANT

## 2011-04-05 NOTE — Anesthesia Postprocedure Evaluation (Signed)
  Anesthesia Post-op Note  Patient: [name]  Procedure(s) Performed:  INTRAMEDULLARY (IM) NAIL FEMORAL - Left hip trochanteric nail  Patient Location: PACU  Anesthesia Type: General  Level of Consciousness: oriented and sedated  Airway and Oxygen Therapy: Patient Spontanous Breathing and Patient connected to nasal cannula oxygen  Post-op Pain: mild  Post-op Assessment: Post-op Vital signs reviewed, Patient's Cardiovascular Status Stable, Respiratory Function Stable and Patent Airway  Post-op Vital Signs: stable  Complications: No apparent anesthesia complications

## 2011-04-05 NOTE — ED Provider Notes (Signed)
History     CSN: [id number] Arrival date & time: 04/04/2011 10:33 PM   First MD Initiated Contact with Patient 04/04/11 2303      Chief Complaint  Patient presents with  . Hip Pain  . Fall    (Consider location/radiation/quality/duration/timing/severity/associated sxs/prior treatment) HPI 70 year old female presents to emergency department via EMS after fall. Patient reports left hip pain and has been unable to stand on her left leg since the fall about 2 hours ago. Patient reports was a mechanical fall, stumbled and fell. No LOC. She did not strike her head. No prior history of hip injury. Patient is not currently being followed by the orthopedist.  Past Medical History  Diagnosis Date  . Atherosclerosis of native arteries of the extremities with ulceration   . Lymphoma, non Hodgkin's   . Hypertension   . CAD (coronary artery disease)   . Myocardial infarction   . DVT (deep venous thrombosis)   . Follicular lymphoma dx'd 05/2009    chemo R-[facility] comp 09/2009; maintenance  Rituxan from 11/2009 to 09/2011  . Neuromuscular disorder 02/2009    Past Surgical History  Procedure Date  . Coronary angioplasty with stent placement   . Femoral-popliteal bypass graft 02/15/10    Left CFA to above knee popliteal BPG by Dr. Leonides Sake  . Toe amputation 03/13/10    Left Great toe by Dr. Leonides Sake  . Angioplasty / stenting femoral 08/28/10    Left distal anastomosis, angioplasty w/stent by Dr. Leonides Sake  . Appendectomy   . Tonsillectomy   . Cholecystectomy     Gall bladder    Family History  Problem Relation Age of Onset  . Heart attack Father     History  Substance Use Topics  . Smoking status: Former Smoker    Types: Cigarettes    Quit date: 02/25/2009  . Smokeless tobacco: Not on file  . Alcohol Use: No    OB History    Grav Para Term Preterm Abortions TAB SAB Ect Mult Living                  Review of Systems  Constitutional: Negative.   HENT: Negative.   Eyes:  Negative.   Respiratory: Negative.   Cardiovascular: Negative.   Gastrointestinal: Negative.   Genitourinary: Negative.   Musculoskeletal: Positive for myalgias, arthralgias and gait problem.  Skin: Negative.   Neurological: Negative.   Hematological: Negative.   Psychiatric/Behavioral: Negative.   All other systems reviewed and are negative.    Allergies  Review of patient's allergies indicates no known allergies.  Home Medications   Current Outpatient Rx  Name Route Sig Dispense Refill  . ACETAMINOPHEN 500 MG PO TABS Oral Take 500 mg by mouth every 6 (six) hours as needed. Takes for pain    . AMLODIPINE BESYLATE 5 MG PO TABS Oral Take 1 tablet (5 mg total) by mouth daily. 30 tablet 6  . ASPIRIN 81 MG PO TABS Oral Take 81 mg by mouth daily.     Marland Kitchen CLOPIDOGREL BISULFATE 75 MG PO TABS Oral Take 1 tablet (75 mg total) by mouth daily. 30 tablet 11  . MAGNESIUM OXIDE 400 MG PO TABS Oral Take 400 mg by mouth 2 (two) times daily.     Marland Kitchen METOPROLOL TARTRATE 50 MG PO TABS  TAKE 1 TABLET BY MOUTH TWICE A DAY 60 tablet 10  . MULTI-VITAMIN/MINERALS PO TABS Oral Take 1 tablet by mouth daily.     Marland Kitchen ROSUVASTATIN CALCIUM 40  MG PO TABS Oral Take 40 mg by mouth daily.     Marland Kitchen NITROGLYCERIN 0.4 MG SL SUBL Sublingual Place 0.4 mg under the tongue every 5 (five) minutes as needed.       BP 103/67  Pulse 60  Temp(Src) 97.8 F (36.6 C) (Oral)  Resp 18  Ht 5\' 6"  (1.676 m)  Wt 150 lb (68.04 kg)  BMI 24.21 kg/m2  SpO2 95%  Physical Exam  Nursing note and vitals reviewed. Constitutional: She is oriented to person, place, and time. She appears well-developed and well-nourished.       Uncomfortable appearing  HENT:  Head: Normocephalic and atraumatic.  Nose: Nose normal.  Mouth/Throat: Oropharynx is clear and moist.  Eyes: Conjunctivae and EOM are normal. Pupils are equal, round, and reactive to light.  Neck: Normal range of motion. Neck supple. No JVD present. No tracheal deviation present. No  thyromegaly present.  Cardiovascular: Normal rate, regular rhythm, normal heart sounds and intact distal pulses.  Exam reveals no gallop and no friction rub.   No murmur heard. Pulmonary/Chest: Effort normal and breath sounds normal. No stridor. No respiratory distress. She has no wheezes. She has no rales. She exhibits no tenderness.  Abdominal: Soft. Bowel sounds are normal. She exhibits no distension and no mass. There is no tenderness. There is no rebound and no guarding.  Musculoskeletal: She exhibits tenderness. She exhibits no edema.       Pain with any attempt at range of motion of left lower extremity with pain to palpation on left lateral hip. mild shortening of the left leg and  Lymphadenopathy:    She has no cervical adenopathy.  Neurological: She is oriented to person, place, and time. No cranial nerve deficit. She exhibits normal muscle tone. Coordination normal.  Skin: Skin is dry. No rash noted. No erythema. No pallor.  Psychiatric: She has a normal mood and affect. Her behavior is normal. Judgment and thought content normal.    ED Course  Procedures (including critical care time)  Labs Reviewed  BASIC METABOLIC PANEL - Abnormal; Notable for the following:    Glucose, Bld 131 (*)    GFR calc non Af Amer 83 (*)    All other components within normal limits  CBC - Abnormal; Notable for the following:    WBC 11.8 (*)    Platelets 148 (*)    All other components within normal limits  DIFFERENTIAL - Abnormal; Notable for the following:    Neutrophils Relative 87 (*)    Neutro Abs 10.3 (*)    Lymphocytes Relative 7 (*)    All other components within normal limits  URINALYSIS, ROUTINE W REFLEX MICROSCOPIC - Abnormal; Notable for the following:    Appearance CLOUDY (*)    Ketones, ur TRACE (*)    All other components within normal limits  PROTIME-INR   Dg Chest 2 View  04/05/2011  *RADIOLOGY REPORT*  Clinical Data: Preop  CHEST - 2 VIEW  Comparison: 02/01/2010  Findings:  Heart is mildly enlarged.  Lungs are clear.  No pneumothorax.  No pleural fluid. Upper thoracic compression deformities are stable.  Hyperaeration.  IMPRESSION: Cardiomegaly without edema.  Original Report Authenticated By: Donavan Burnet, M.D.   Dg Hip Complete Left  04/04/2011  *RADIOLOGY REPORT*  Clinical Data: Fall  LEFT HIP - COMPLETE 2+ VIEW  Comparison: 11/24/2010  Findings: There is lucency through the intertrochanteric region of the left proximal femur worrisome for fracture.  Osteopenia.  Bony framework is otherwise  intact.  Vascular calcifications.  IMPRESSION: Findings are worrisome for a left intertrochanteric femur fracture. Confirmation with MRI or CT is recommended.  Original Report Authenticated By: Donavan Burnet, M.D.   Ct Hip Left Wo Contrast  04/05/2011  *RADIOLOGY REPORT*  Clinical Data: Fall  CT OF THE LEFT HIP WITHOUT CONTRAST  Technique:  Multidetector CT imaging was performed according to the standard protocol. Multiplanar CT image reconstructions were also generated.  Comparison: None.  Findings: Intertrochanteric femur fracture with impaction and minimal displacement is present.  Acetabulum is intact.  Foley catheter decompresses the bladder.  IMPRESSION: Acute left intertrochanteric femur fracture.  Original Report Authenticated By: Donavan Burnet, M.D.    Date: 04/05/2011  Rate: 55  Rhythm: normal sinus rhythm  QRS Axis: left  Intervals: normal and PR prolonged  ST/T Wave abnormalities: normal  Conduction Disutrbances:none and left bundle branch block  Narrative Interpretation: LVH with repol abn  Old EKG Reviewed: changes noted, QT improved from older    Left Intertrochanteric fracture 1:02 AM D/w Dr Fara Chute who will see patient in consult in the am, requests medicine admission.   MDM  70 year old female with history of peripheral vascular disease, lymphoma in remission hypertension coronary disease MI who has left intertrochanteric fracture. Will discuss with  orthopedist and may need admission through hospitalist        Olivia Mackie, MD 04/05/11 [phone]

## 2011-04-05 NOTE — Progress Notes (Signed)
Subjective: Patient is lying comfortably on the bed. She denies any complaints. She said that the surgery went without any problems. Pain is very well under control. Denies any chest pain or shortness of breath.  Objective: Vital signs in last 24 hours: Temp:  [97 F (36.1 C)-99.3 F (37.4 C)] 98.2 F (36.8 C) (11/08 1512) Pulse Rate:  [54-84] 82  (11/08 1512) Resp:  [5-21] 12  (11/08 1512) BP: (103-158)/(49-83) 112/73 mmHg (11/08 1512) SpO2:  [90 %-100 %] 94 % (11/08 1512) Weight:  [68.04 kg (150 lb)] 150 lb (68.04 kg) (11/08 0330) Weight change:  Last BM Date: 04/04/11  Intake/Output from previous day: 11/07 0701 - 11/08 0700 In: -  Out: 375 [Urine:375] Intake/Output this shift: Total I/O In: 1600 [I.V.:1600] Out: 400 [Urine:300; Blood:100]  Physical Exam  Vitals reviewed. Constitutional: She is oriented to person, place, and time. She appears well-developed and well-nourished.  HENT:  Head: Normocephalic and atraumatic.  Mouth/Throat: No oropharyngeal exudate.  Eyes: EOM are normal. Pupils are equal, round, and reactive to light. No scleral icterus.  Neck: Normal range of motion. Neck supple. No thyromegaly present.  Cardiovascular: Normal rate and regular rhythm.  Exam reveals no friction rub.   Murmur heard. Pulmonary/Chest: Effort normal. No respiratory distress. She has no wheezes.  Abdominal: Soft. Bowel sounds are normal. She exhibits no mass. There is no tenderness. There is no rebound.  Musculoskeletal: Normal range of motion. She exhibits no edema and no tenderness.  Neurological: She is alert and oriented to person, place, and time.  Skin: Skin is warm and dry. No pallor.  Psychiatric: She has a normal mood and affect.    Lab Results:  [facility] 04/04/11 2350  WBC 11.8*  HGB 13.8  HCT 41.5  PLT 148*   BMET  Basename 04/04/11 2350  NA 142  K 3.7  CL 105  CO2 24  GLUCOSE 131*  BUN 17  CREATININE 0.76  CALCIUM 10.0    Results for orders  placed during the hospital encounter of 04/04/11 (from the past 24 hour(s))  BASIC METABOLIC PANEL     Status: Abnormal   Collection Time   04/04/11 11:50 PM      Component Value Range   Sodium 142  135 - 145 (mEq/L)   Potassium 3.7  3.5 - 5.1 (mEq/L)   Chloride 105  96 - 112 (mEq/L)   CO2 24  19 - 32 (mEq/L)   Glucose, Bld 131 (*) 70 - 99 (mg/dL)   BUN 17  6 - 23 (mg/dL)   Creatinine, Ser 9.14  0.50 - 1.10 (mg/dL)   Calcium 78.2  8.4 - 10.5 (mg/dL)   GFR calc non Af Amer 83 (*) >90 (mL/min)   GFR calc Af Amer >90  >90 (mL/min)  CBC     Status: Abnormal   Collection Time   04/04/11 11:50 PM      Component Value Range   WBC 11.8 (*) 4.0 - 10.5 (K/uL)   RBC 4.36  3.87 - 5.11 (MIL/uL)   Hemoglobin 13.8  12.0 - 15.0 (g/dL)   HCT 95.6  21.3 - 08.6 (%)   MCV 95.2  78.0 - 100.0 (fL)   MCH 31.7  26.0 - 34.0 (pg)   MCHC 33.3  30.0 - 36.0 (g/dL)   RDW 57.8  46.9 - 62.9 (%)   Platelets 148 (*) 150 - 400 (K/uL)  DIFFERENTIAL     Status: Abnormal   Collection Time   04/04/11 11:50 PM  Component Value Range   Neutrophils Relative 87 (*) 43 - 77 (%)   Neutro Abs 10.3 (*) 1.7 - 7.7 (K/uL)   Lymphocytes Relative 7 (*) 12 - 46 (%)   Lymphs Abs 0.8  0.7 - 4.0 (K/uL)   Monocytes Relative 6  3 - 12 (%)   Monocytes Absolute 0.7  0.1 - 1.0 (K/uL)   Eosinophils Relative 1  0 - 5 (%)   Eosinophils Absolute 0.1  0.0 - 0.7 (K/uL)   Basophils Relative 0  0 - 1 (%)   Basophils Absolute 0.0  0.0 - 0.1 (K/uL)  PROTIME-INR     Status: Normal   Collection Time   04/04/11 11:50 PM      Component Value Range   Prothrombin Time 12.1  11.6 - 15.2 (seconds)   INR 0.88  0.00 - 1.49   URINALYSIS, ROUTINE W REFLEX MICROSCOPIC     Status: Abnormal   Collection Time   04/05/11 12:15 AM      Component Value Range   Color, Urine YELLOW  YELLOW    Appearance CLOUDY (*) CLEAR    Specific Gravity, Urine 1.017  1.005 - 1.030    pH 7.5  5.0 - 8.0    Glucose, UA NEGATIVE  NEGATIVE (mg/dL)   Hgb urine dipstick  NEGATIVE  NEGATIVE    Bilirubin Urine NEGATIVE  NEGATIVE    Ketones, ur TRACE (*) NEGATIVE (mg/dL)   Protein, ur NEGATIVE  NEGATIVE (mg/dL)   Urobilinogen, UA 0.2  0.0 - 1.0 (mg/dL)   Nitrite NEGATIVE  NEGATIVE    Leukocytes, UA NEGATIVE  NEGATIVE   SURGICAL PCR SCREEN     Status: Normal   Collection Time   04/05/11  9:01 AM      Component Value Range   MRSA, PCR NEGATIVE  NEGATIVE    Staphylococcus aureus NEGATIVE  NEGATIVE   TYPE AND SCREEN     Status: Normal   Collection Time   04/05/11  9:30 AM      Component Value Range   ABO/RH(D) A POS     Antibody Screen NEG     Sample Expiration 04/08/2011      Studies/Results: Dg Chest 2 View  04/05/2011  *RADIOLOGY REPORT*  Clinical Data: Preop  CHEST - 2 VIEW  Comparison: 02/01/2010  Findings: Heart is mildly enlarged.  Lungs are clear.  No pneumothorax.  No pleural fluid. Upper thoracic compression deformities are stable.  Hyperaeration.  IMPRESSION: Cardiomegaly without edema.  Original Report Authenticated By: Donavan Burnet, M.D.   Dg Hip Complete Left  04/04/2011  *RADIOLOGY REPORT*  Clinical Data: Fall  LEFT HIP - COMPLETE 2+ VIEW  Comparison: 11/24/2010  Findings: There is lucency through the intertrochanteric region of the left proximal femur worrisome for fracture.  Osteopenia.  Bony framework is otherwise intact.  Vascular calcifications.  IMPRESSION: Findings are worrisome for a left intertrochanteric femur fracture. Confirmation with MRI or CT is recommended.  Original Report Authenticated By: Donavan Burnet, M.D.   Dg Femur Left  04/05/2011  *RADIOLOGY REPORT*  Clinical Data: The intertrochanteric fracture of the left hip.  LEFT FEMUR - 2 VIEW  Comparison: CT scan of 04/05/2011  Findings:  The patient has undergone insertion of an intramedullary rod and an intertrochanteric nail into the femoral head.  Alignment of the fracture fragments is anatomic.  IMPRESSION: Open reduction and internal fixation of the intertrochanteric  fracture of the proximal left femur.  Original Report Authenticated By: Gwynn Burly,  M.D.   Ct Hip Left Wo Contrast  04/05/2011  *RADIOLOGY REPORT*  Clinical Data: Fall  CT OF THE LEFT HIP WITHOUT CONTRAST  Technique:  Multidetector CT imaging was performed according to the standard protocol. Multiplanar CT image reconstructions were also generated.  Comparison: None.  Findings: Intertrochanteric femur fracture with impaction and minimal displacement is present.  Acetabulum is intact.  Foley catheter decompresses the bladder.  IMPRESSION: Acute left intertrochanteric femur fracture.  Original Report Authenticated By: Donavan Burnet, M.D.   Dg C-arm 1-60 Min-no Report  04/05/2011  CLINICAL DATA: trochanteric nail   C-ARM 1-60 MINUTES  Fluoroscopy was utilized by the requesting physician.  No radiographic  interpretation.      Medications:  Scheduled:   . amLODipine  5 mg Oral Daily  . aspirin  81 mg Oral Daily  . ceFAZolin (ANCEF) IV  1 g Intravenous 60 min Pre-Op  . enoxaparin  40 mg Subcutaneous Q24H  . fentaNYL  50 mcg Intravenous Once  . HYDROmorphone      . magnesium oxide  400 mg Oral BID  . metoprolol  50 mg Oral BID  . morphine      . ondansetron (ZOFRAN) IV  4 mg Intravenous Once  . promethazine      . rosuvastatin  40 mg Oral Daily    Principal Problem:  *Hip fracture Active Problems:  DYSLIPIDEMIA  ANEMIA  Essential hypertension, benign  CAD, NATIVE VESSEL  DIASTOLIC DYSFUNCTION  PVD  DVT  PAD (peripheral artery disease)  Follicular lymphoma   Assessment/Plan: #1 left hip fracture, secondary to a fall: Patient is status post open reduction, internal fixation. She is stable currently. Management per orthopedic team.  #2 history of cardiac disease: CAD is stable. She does have diastolic dysfunction. We will reduce her IV fluids.  #3 history of peripheral vascular disease: This issue is stable as well.  #4 history of lymphoma in remission.   #5 DVT,  prophylaxis as per orthopedic team.  We will continue to follow the patient on a daily basis. It is anticipated that she will require skilled nursing facility for rehabilitation.    LOS: 1 day   [name] 04/05/2011, 3:44 PM

## 2011-04-05 NOTE — ED Notes (Signed)
Patient stable resting in room.

## 2011-04-05 NOTE — Consult Note (Signed)
Reason for Consult:L hip fracture  Referring Physician: [facility]  [name] is an 70 y.o. female.  HPI: 89 F fell last night after losing balance.  C/o L hip pain.  Denies other injuries or LOC with fall.  Pain is mild now when resting.   Past Medical History  Diagnosis Date  . Atherosclerosis of native arteries of the extremities with ulceration   . Lymphoma, non Hodgkin's   . Hypertension   . CAD (coronary artery disease)   . Myocardial infarction   . DVT (deep venous thrombosis)   . Follicular lymphoma dx'd 05/2009    chemo R-CHOP comp 09/2009; maintenance  Rituxan from 11/2009 to 09/2011  . Neuromuscular disorder 02/2009    Past Surgical History  Procedure Date  . Coronary angioplasty with stent placement   . Femoral-popliteal bypass graft 02/15/10    Left CFA to above knee popliteal BPG by Dr. Leonides Sake  . Toe amputation 03/13/10    Left Great toe by Dr. Leonides Sake  . Angioplasty / stenting femoral 08/28/10    Left distal anastomosis, angioplasty w/stent by Dr. Leonides Sake  . Appendectomy   . Tonsillectomy   . Cholecystectomy     Gall bladder    Family History  Problem Relation Age of Onset  . Heart attack Father     Social History:  reports that she quit smoking about 2 years ago. Her smoking use included Cigarettes. She does not have any smokeless tobacco history on file. She reports that she uses illicit drugs. She reports that she does not drink alcohol.  Allergies: No Known Allergies  Medications:  Prior to Admission:  Prescriptions prior to admission  Medication Sig Dispense Refill  . acetaminophen (TYLENOL) 500 MG tablet Take 500 mg by mouth every 6 (six) hours as needed. Takes for pain      . amLODipine (NORVASC) 5 MG tablet Take 1 tablet (5 mg total) by mouth daily.  30 tablet  6  . aspirin 81 MG tablet Take 81 mg by mouth daily.       . clopidogrel (PLAVIX) 75 MG tablet Take 1 tablet (75 mg total) by mouth daily.  30 tablet  11  .  magnesium oxide (MAG-OX) 400 MG tablet Take 400 mg by mouth 2 (two) times daily.       . metoprolol (LOPRESSOR) 50 MG tablet TAKE 1 TABLET BY MOUTH TWICE A DAY  60 tablet  10  . Multiple Vitamins-Minerals (MULTIVITAMIN WITH MINERALS) tablet Take 1 tablet by mouth daily.       . rosuvastatin (CRESTOR) 40 MG tablet Take 40 mg by mouth daily.       . nitroGLYCERIN (NITROSTAT) 0.4 MG SL tablet Place 0.4 mg under the tongue every 5 (five) minutes as needed.         Results for orders placed during the hospital encounter of 04/04/11 (from the past 48 hour(s))  BASIC METABOLIC PANEL     Status: Abnormal   Collection Time   04/04/11 11:50 PM      Component Value Range Comment   Sodium 142  135 - 145 (mEq/L)    Potassium 3.7  3.5 - 5.1 (mEq/L)    Chloride 105  96 - 112 (mEq/L)    CO2 24  19 - 32 (mEq/L)    Glucose, Bld 131 (*) 70 - 99 (mg/dL)    BUN 17  6 - 23 (mg/dL)    Creatinine, Ser 1.61  0.50 - 1.10 (mg/dL)  Calcium 10.0  8.4 - 10.5 (mg/dL)    GFR calc non Af Amer 83 (*) >90 (mL/min)    GFR calc Af Amer >90  >90 (mL/min)   CBC     Status: Abnormal   Collection Time   04/04/11 11:50 PM      Component Value Range Comment   WBC 11.8 (*) 4.0 - 10.5 (K/uL)    RBC 4.36  3.87 - 5.11 (MIL/uL)    Hemoglobin 13.8  12.0 - 15.0 (g/dL)    HCT 09.8  11.9 - 14.7 (%)    MCV 95.2  78.0 - 100.0 (fL)    MCH 31.7  26.0 - 34.0 (pg)    MCHC 33.3  30.0 - 36.0 (g/dL)    RDW 82.9  56.2 - 13.0 (%)    Platelets 148 (*) 150 - 400 (K/uL)   DIFFERENTIAL     Status: Abnormal   Collection Time   04/04/11 11:50 PM      Component Value Range Comment   Neutrophils Relative 87 (*) 43 - 77 (%)    Neutro Abs 10.3 (*) 1.7 - 7.7 (K/uL)    Lymphocytes Relative 7 (*) 12 - 46 (%)    Lymphs Abs 0.8  0.7 - 4.0 (K/uL)    Monocytes Relative 6  3 - 12 (%)    Monocytes Absolute 0.7  0.1 - 1.0 (K/uL)    Eosinophils Relative 1  0 - 5 (%)    Eosinophils Absolute 0.1  0.0 - 0.7 (K/uL)    Basophils Relative 0  0 - 1 (%)     Basophils Absolute 0.0  0.0 - 0.1 (K/uL)   PROTIME-INR     Status: Normal   Collection Time   04/04/11 11:50 PM      Component Value Range Comment   Prothrombin Time 12.1  11.6 - 15.2 (seconds)    INR 0.88  0.00 - 1.49    URINALYSIS, ROUTINE W REFLEX MICROSCOPIC     Status: Abnormal   Collection Time   04/05/11 12:15 AM      Component Value Range Comment   Color, Urine YELLOW  YELLOW     Appearance CLOUDY (*) CLEAR     Specific Gravity, Urine 1.017  1.005 - 1.030     pH 7.5  5.0 - 8.0     Glucose, UA NEGATIVE  NEGATIVE (mg/dL)    Hgb urine dipstick NEGATIVE  NEGATIVE     Bilirubin Urine NEGATIVE  NEGATIVE     Ketones, ur TRACE (*) NEGATIVE (mg/dL)    Protein, ur NEGATIVE  NEGATIVE (mg/dL)    Urobilinogen, UA 0.2  0.0 - 1.0 (mg/dL)    Nitrite NEGATIVE  NEGATIVE     Leukocytes, UA NEGATIVE  NEGATIVE  MICROSCOPIC NOT DONE ON URINES WITH NEGATIVE PROTEIN, BLOOD, LEUKOCYTES, NITRITE, OR GLUCOSE <1000 mg/dL.    Dg Chest 2 View  04/05/2011  *RADIOLOGY REPORT*  Clinical Data: Preop  CHEST - 2 VIEW  Comparison: 02/01/2010  Findings: Heart is mildly enlarged.  Lungs are clear.  No pneumothorax.  No pleural fluid. Upper thoracic compression deformities are stable.  Hyperaeration.  IMPRESSION: Cardiomegaly without edema.  Original Report Authenticated By: Donavan Burnet, M.D.   Dg Hip Complete Left  04/04/2011  *RADIOLOGY REPORT*  Clinical Data: Fall  LEFT HIP - COMPLETE 2+ VIEW  Comparison: 11/24/2010  Findings: There is lucency through the intertrochanteric region of the left proximal femur worrisome for fracture.  Osteopenia.  Bony framework is otherwise  intact.  Vascular calcifications.  IMPRESSION: Findings are worrisome for a left intertrochanteric femur fracture. Confirmation with MRI or CT is recommended.  Original Report Authenticated By: Donavan Burnet, M.D.   Ct Hip Left Wo Contrast  04/05/2011  *RADIOLOGY REPORT*  Clinical Data: Fall  CT OF THE LEFT HIP WITHOUT CONTRAST  Technique:   Multidetector CT imaging was performed according to the standard protocol. Multiplanar CT image reconstructions were also generated.  Comparison: None.  Findings: Intertrochanteric femur fracture with impaction and minimal displacement is present.  Acetabulum is intact.  Foley catheter decompresses the bladder.  IMPRESSION: Acute left intertrochanteric femur fracture.  Original Report Authenticated By: Donavan Burnet, M.D.    Review of Systems  All other systems reviewed and are negative.   Blood pressure 124/79, pulse 84, temperature 99.3 F (37.4 C), temperature source Oral, resp. rate 16, height 5\' 6"  (1.676 m), weight 68.04 kg (150 lb), SpO2 96.00%. Physical Exam  Constitutional: She is oriented to person, place, and time. She appears well-developed and well-nourished.  HENT:  Head: Atraumatic.  Eyes: EOM are normal.  Cardiovascular: Intact distal pulses.   Respiratory: Effort normal.  GI: Soft.  Musculoskeletal:       Left hip: She exhibits decreased range of motion, tenderness and swelling.       Legs: Neurological: She is alert and oriented to person, place, and time.  Skin: Skin is warm and dry.    Assessment/Plan: 70 yo F with L IT hip fx  1. NPO for OR, discussed R/B of surgery. Pt and daughter agree with plan. 2. Plavix- will proceed with surgery, benefits of early surgery outweigh risks of waiting. 3. Will be WBAT post op.  [name] 04/05/2011, 8:46 AM

## 2011-04-05 NOTE — ED Notes (Signed)
Iv team is in room with the patient right now trying to get a line on her

## 2011-04-05 NOTE — Anesthesia Preprocedure Evaluation (Addendum)
Anesthesia Evaluation  Patient identified by MRN, date of birth, ID band Patient awake    Reviewed: Allergy & Precautions, H&P , NPO status , Patient's Chart, lab work & pertinent test results, reviewed documented beta blocker date and time   Airway Mallampati: II TM Distance: >3 FB Neck ROM: Full    Dental  (+) Lower Dentures and Upper Dentures   Pulmonary neg pulmonary ROS,  clear to auscultation        Cardiovascular hypertension, Pt. on medications + CAD and + Past MI Regular Normal MI 2010, PTCA at that time w/ stent. Currently asymptomatic.   Has not stopped ASA & Plavix   Neuro/Psych Negative Neurological ROS  Negative Psych ROS   GI/Hepatic negative GI ROS, Neg liver ROS,   Endo/Other  Negative Endocrine ROS  Renal/GU negative Renal ROS  Genitourinary negative   Musculoskeletal negative musculoskeletal ROS (+)   Abdominal   Peds negative pediatric ROS (+)  Hematology Hx Lymphoma, pt states in remission   Anesthesia Other Findings   Reproductive/Obstetrics negative OB ROS                           Anesthesia Physical Anesthesia Plan  ASA: III  Anesthesia Plan: General   Post-op Pain Management:    Induction: Intravenous  Airway Management Planned: Oral ETT  Additional Equipment:   Intra-op Plan:   Post-operative Plan: Extubation in OR  Informed Consent: I have reviewed the patients History and Physical, chart, labs and discussed the procedure including the risks, benefits and alternatives for the proposed anesthesia with the patient or authorized representative who has indicated his/her understanding and acceptance.     Plan Discussed with: CRNA and Surgeon  Anesthesia Plan Comments:         Anesthesia Quick Evaluation

## 2011-04-05 NOTE — Transfer of Care (Signed)
Immediate Anesthesia Transfer of Care Note  Patient: [name]  Procedure(s) Performed:  INTRAMEDULLARY (IM) NAIL FEMORAL - Left hip trochanteric nail  Patient Location: PACU  Anesthesia Type: General  Level of Consciousness: awake, alert  and oriented  Airway & Oxygen Therapy: Patient Spontanous Breathing  Post-op Assessment: Report given to PACU RN  Post vital signs: Reviewed and stable  Complications: No apparent anesthesia complications

## 2011-04-05 NOTE — ED Notes (Signed)
Awaiting iv team to come start iv

## 2011-04-05 NOTE — Anesthesia Procedure Notes (Signed)
Procedures

## 2011-04-05 NOTE — H&P (Addendum)
PCP:   Verne Carrow, MD, MD  Cardiologist: Dr Clifton James Oncologist: Dr Gaylyn Rong  Chief Complaint:  Fall  HPI: This is a pleasant 70 y/o female who was walking outside, she states she lost her balance and fell. She was not able to get up. She hip her left hip, she did not hit of head or lose consciousness. She reports no chest pain. She had an MI in 2010, s/p stenting. She states she does not get chest pain, no h/o CHF. She does limited ADL's at home. She is able to climb the stairs at home with no chest discomfort (this she seldom does). She ambulates with a cane. Her most recent surgeries are bypass grafts which, she tolerated the procedures well. History obtained from patient who appears reliable.  Review of Systems: (positives bolded) The patient denies anorexia, fever, weight loss,, vision loss, decreased hearing, hoarseness, chest pain, syncope, dyspnea on exertion, peripheral edema, balance deficits, hemoptysis, abdominal pain, melena, hematochezia, severe indigestion/heartburn, hematuria, incontinence, genital sores, muscle weakness, suspicious skin lesions, transient blindness, difficulty walking, depression, unusual weight change, abnormal bleeding, enlarged lymph nodes, angioedema, and breast masses.  Past Medical History: Past Medical History  Diagnosis Date  . Atherosclerosis of native arteries of the extremities with ulceration   . Lymphoma, non Hodgkin's   . Hypertension   . CAD (coronary artery disease)   . Myocardial infarction   . DVT (deep venous thrombosis)   . Follicular lymphoma dx'd 05/2009    chemo R-CHOP comp 09/2009; maintenance  Rituxan from 11/2009 to 09/2011  . Neuromuscular disorder 02/2009   Past Surgical History  Procedure Date  . Coronary angioplasty with stent placement   . Femoral-popliteal bypass graft 02/15/10    Left CFA to above knee popliteal BPG by Dr. Leonides Sake  . Toe amputation 03/13/10    Left Great toe by Dr. Leonides Sake  . Angioplasty /  stenting femoral 08/28/10    Left distal anastomosis, angioplasty w/stent by Dr. Leonides Sake  . Appendectomy   . Tonsillectomy   . Cholecystectomy     Gall bladder    Medications: Prior to Admission medications   Medication Sig Start Date End Date Taking? Authorizing Provider  acetaminophen (TYLENOL) 500 MG tablet Take 500 mg by mouth every 6 (six) hours as needed. Takes for pain   Yes Historical Provider, MD  amLODipine (NORVASC) 5 MG tablet Take 1 tablet (5 mg total) by mouth daily. 11/10/10 11/10/11 Yes Verne Carrow, MD  aspirin 81 MG tablet Take 81 mg by mouth daily.    Yes Historical Provider, MD  clopidogrel (PLAVIX) 75 MG tablet Take 1 tablet (75 mg total) by mouth daily. 11/20/10  Yes Verne Carrow, MD  magnesium oxide (MAG-OX) 400 MG tablet Take 400 mg by mouth 2 (two) times daily.    Yes Historical Provider, MD  metoprolol (LOPRESSOR) 50 MG tablet TAKE 1 TABLET BY MOUTH TWICE A DAY 04/04/11  Yes Verne Carrow, MD  Multiple Vitamins-Minerals (MULTIVITAMIN WITH MINERALS) tablet Take 1 tablet by mouth daily.    Yes Historical Provider, MD  rosuvastatin (CRESTOR) 40 MG tablet Take 40 mg by mouth daily.    Yes Historical Provider, MD  nitroGLYCERIN (NITROSTAT) 0.4 MG SL tablet Place 0.4 mg under the tongue every 5 (five) minutes as needed.     Historical Provider, MD    Allergies:  No Known Allergies  Social History:  reports that she quit smoking about 2 years ago. Her smoking use included Cigarettes. She does not have  any smokeless tobacco history on file. She reports that she uses illicit drugs. She reports that she does not drink alcohol. lives with daughter and grand-daughter. Walks with a walker. No home oxygen.  Family History: Family History  Problem Relation Age of Onset  . Heart attack Father     Physical Exam: Filed Vitals:   04/05/11 0045 04/05/11 0230 04/05/11 0231 04/05/11 0305  BP: 130/74  121/72 158/78  Pulse: 61 70  63  Temp: 98 F (36.7  C)   98.7 F (37.1 C)  TempSrc: Oral   Oral  Resp: 18 13  16   Height:      Weight:      SpO2: 98% 91% 95% 98%    General:  Alert and oriented times three, well developed and nourished, no acute distress Eyes: PERRLA, pink conjunctiva, scleral icterus ENT: Moist oral mucosa, neck supple, no thyromegaly Lungs: clear to ascultation, no wheeze, no crackles, no use of accessory muscles Cardiovascular: regular rate and rhythm, no regurgitation, no gallops, no murmurs. No carotid bruits, no JVD Abdomen: soft, positive BS, non-tender, non-distended, no organomegaly, not an acute abdomen GU: not examined Neuro: CN II - XII grossly intact, sensation intact Musculoskeletal: strength 5/5 all extremities, no clubbing, cyanosis or edema, amputation left great toe Skin: no rash, no subcutaneous crepitation, no decubitus Psych: appropriate patient   Labs on Admission:   Basename 04/04/11 2350  NA 142  K 3.7  CL 105  CO2 24  GLUCOSE 131*  BUN 17  CREATININE 0.76  CALCIUM 10.0  MG --  PHOS --   No results found for this basename: AST:2,ALT:2,ALKPHOS:2,BILITOT:2,PROT:2,ALBUMIN:2 in the last 72 hours No results found for this basename: LIPASE:2,AMYLASE:2 in the last 72 hours  Basename 04/04/11 2350  WBC 11.8*  NEUTROABS 10.3*  HGB 13.8  HCT 41.5  MCV 95.2  PLT 148*   No results found for this basename: CKTOTAL:3,CKMB:3,CKMBINDEX:3,TROPONINI:3 in the last 72 hours No results found for this basename: TSH,T4TOTAL,FREET3,T3FREE,THYROIDAB in the last 72 hours No results found for this basename: VITAMINB12:2,FOLATE:2,FERRITIN:2,TIBC:2,IRON:2,RETICCTPCT:2 in the last 72 hours  Radiological Exams on Admission: Dg Chest 2 View  04/05/2011  *RADIOLOGY REPORT*  Clinical Data: Preop  CHEST - 2 VIEW  Comparison: 02/01/2010  Findings: Heart is mildly enlarged.  Lungs are clear.  No pneumothorax.  No pleural fluid. Upper thoracic compression deformities are stable.  Hyperaeration.  IMPRESSION:  Cardiomegaly without edema.  Original Report Authenticated By: Donavan Burnet, M.D.   Dg Hip Complete Left  04/04/2011  *RADIOLOGY REPORT*  Clinical Data: Fall  LEFT HIP - COMPLETE 2+ VIEW  Comparison: 11/24/2010  Findings: There is lucency through the intertrochanteric region of the left proximal femur worrisome for fracture.  Osteopenia.  Bony framework is otherwise intact.  Vascular calcifications.  IMPRESSION: Findings are worrisome for a left intertrochanteric femur fracture. Confirmation with MRI or CT is recommended.  Original Report Authenticated By: Donavan Burnet, M.D.   Ct Hip Left Wo Contrast  04/05/2011  *RADIOLOGY REPORT*  Clinical Data: Fall  CT OF THE LEFT HIP WITHOUT CONTRAST  Technique:  Multidetector CT imaging was performed according to the standard protocol. Multiplanar CT image reconstructions were also generated.  Comparison: None.  Findings: Intertrochanteric femur fracture with impaction and minimal displacement is present.  Acetabulum is intact.  Foley catheter decompresses the bladder.  IMPRESSION: Acute left intertrochanteric femur fracture.  Original Report Authenticated By: Donavan Burnet, M.D.   EKG: NSR   Assessment/Plan Present on Admission:  .  Acute left intertrochanteric femur fracture -admit to ortho floor -Ortho Dr Fara Chute aware, to see patient in AM CAD s/p stenting PVD/PAD -patient on ASA and plavix.  -Plavix on hold tonight, baby ASA continued. Would recommend touching basis with cards in AM re; holding of Plavix. -CBC in AM -AM team to consult cards as needed -peri-operative B blocker .Follicular lymphoma .Essential hypertension, benign .DYSLIPIDEMIA .ANEMIA . h/o DVT -stable, lymphoma in remission -resume home meds   Code status: DNR DVT prophylaxis Team 3   Wilder Kurowski 04/05/2011, 3:30 AM

## 2011-04-05 NOTE — Telephone Encounter (Signed)
Pt 's dtr called in requesting to cancel her mother's appts for 04/06/2011 due to her mother is hospitalized

## 2011-04-05 NOTE — Op Note (Addendum)
Procedure(s): INTRAMEDULLARY (IM) NAIL FEMORAL Procedure Note  MACKENIZE DELGADILLO female 70 y.o. 04/05/2011  Procedure(s) and Anesthesia Type:    * INTRAMEDULLARY (IM) NAIL FEMORAL - General  Surgeon(s) and Role:    * Mable Paris, MD - Primary   Indications: 70 yo F with L hip fx.  Surgeon: Mable Paris   Assistants: none  Anesthesia: General endotracheal anesthesia  ASA Class: 3    Procedure Detail  INTRAMEDULLARY (IM) NAIL FEMORAL  Findings: L affixus 11mm nail  Estimated Blood Loss:  less than 100 mL         Drains: none  Blood Given: none          Specimens: none        Complications:  * No complications entered in OR log *         Disposition: PACU - hemodynamically stable.         Condition: stable  The patient was identified in the preoperative holding area  where I personally marked the operative site after verifying site, side,  and procedure with the patient. She was taken back to the operating  room where general anesthesia was induced without complication. She was  placed on the fracture table with the left lower extremity in traction,  and opposite lower extremity in a flexed abducted position. The arms were well  padded. Fluoroscopic imaging was used to verify reduction with gentle traction  and internal rotation. The left hip was then prepped and draped in the standard sterile fashion. An approximately 3 cm incision was made proximal  to the palpable greater trochanter tip. Dissection was carried down to  the tip and the short guidewire was placed under fluoroscopic imaging.  The proximal entry reamer was used to open the canal and the ball-tipped  guidewire was then placed and advanced down the femoral canal. This was  used to obtain a measurement for the implant and then was subsequently  over reamed up to 12.5 mm by 0.5 mm increments. The 11 mm nail was then  advanced over the guidewire without difficulty and the  proximal jig was  then placed and a small 1.5 cm incision was made on the lateral thigh to  advance the lag screw guide against the lateral aspect of the femur.  The guide pin was advanced and its position was verified in AP and  lateral planes to be centered in the head.  The guidewire was over  reamed and the appropriate size lag screw was advanced. The  proximal set screw was then advanced, backed off a quarter turn to allow  sliding. AP and lateral imaging demonstrated appropriate position of  the screw and reduction of the fracture. The proximal jig was then  removed.  Final fluoroscopic imaging in AP and lateral planes at the hip and the  knee demonstrated near anatomic reduction with appropriate length and  position of the hardware. All wounds were then copiously irrigated with  normal saline and subsequently closed in layers with #1 Vicryl in a deep  fascia layer, 2-0 Vicryl in a deep dermal layer, and staples for skin  closure. Sterile dressings were then applied including 4x4s and Mepilex  dressings. The patient was then taken off the fracture table,  transferred to the stretcher, and taken to the recovery room in stable  condition after she was extubated.   POSTOPERATIVE PLAN: She will be weightbearing as tolerated on the operative extremity. She will have DVT prophylaxis of SCDs.

## 2011-04-06 ENCOUNTER — Other Ambulatory Visit: Payer: Medicare Other

## 2011-04-06 ENCOUNTER — Ambulatory Visit: Payer: Medicare Other

## 2011-04-06 ENCOUNTER — Ambulatory Visit: Payer: Medicare Other | Admitting: Oncology

## 2011-04-06 LAB — BASIC METABOLIC PANEL
Calcium: 8.5 mg/dL (ref 8.4–10.5)
Creatinine, Ser: 0.99 mg/dL (ref 0.50–1.10)
GFR calc non Af Amer: 56 mL/min — ABNORMAL LOW (ref >90)
Glucose, Bld: 131 mg/dL — ABNORMAL HIGH (ref 70–99)
Sodium: 140 mEq/L (ref 135–145)

## 2011-04-06 LAB — CBC
MCH: 31.8 pg (ref 26.0–34.0)
MCHC: 32.7 g/dL (ref 30.0–36.0)
MCV: 97.1 fL (ref 78.0–100.0)
Platelets: 81 10*3/uL — ABNORMAL LOW (ref 150–400)

## 2011-04-06 MED ORDER — CEFAZOLIN SODIUM 1-5 GM-% IV SOLN
1.0000 g | Freq: Three times a day (TID) | INTRAVENOUS | Status: AC
Start: 2011-04-06 — End: 2011-04-06
  Administered 2011-04-06 (×2): 1 g via INTRAVENOUS
  Filled 2011-04-06 (×2): qty 50

## 2011-04-06 MED ORDER — METOCLOPRAMIDE HCL 5 MG/ML IJ SOLN: 5.0000 mg | Freq: Three times a day (TID) | INTRAMUSCULAR | Status: DC | PRN

## 2011-04-06 MED ORDER — KCL IN DEXTROSE-NACL 20-5-0.45 MEQ/L-%-% IV SOLN
INTRAVENOUS | Status: DC
  Administered 2011-04-06: 13:00:00 via INTRAVENOUS
  Filled 2011-04-06 (×5): qty 1000

## 2011-04-06 MED ORDER — DOCUSATE SODIUM 100 MG PO CAPS
100.0000 mg | ORAL_CAPSULE | Freq: Two times a day (BID) | ORAL | Status: DC
  Administered 2011-04-06 – 2011-04-11 (×11): 100 mg via ORAL
  Filled 2011-04-06 (×13): qty 1

## 2011-04-06 MED ORDER — MAGNESIUM HYDROXIDE 400 MG/5ML PO SUSP: 30.0000 mL | Freq: Two times a day (BID) | ORAL | Status: DC | PRN

## 2011-04-06 MED ORDER — BISACODYL 10 MG RE SUPP: 10.0000 mg | Freq: Every day | RECTAL | Status: DC | PRN

## 2011-04-06 MED ORDER — BISACODYL 5 MG PO TBEC
10.0000 mg | DELAYED_RELEASE_TABLET | Freq: Every day | ORAL | Status: DC | PRN
  Administered 2011-04-08: 10 mg via ORAL
  Filled 2011-04-06: qty 2

## 2011-04-06 MED ORDER — FLEET ENEMA 7-19 GM/118ML RE ENEM: 1.0000 | ENEMA | Freq: Every day | RECTAL | Status: DC | PRN

## 2011-04-06 MED ORDER — PHENOL 1.4 % MT LIQD: 1.0000 | OROMUCOSAL | Status: DC | PRN

## 2011-04-06 MED ORDER — MENTHOL 3 MG MT LOZG: 1.0000 | LOZENGE | OROMUCOSAL | Status: DC | PRN

## 2011-04-06 MED ORDER — POLYETHYLENE GLYCOL 3350 17 G PO PACK
17.0000 g | PACK | Freq: Every day | ORAL | Status: DC | PRN
  Filled 2011-04-06: qty 1

## 2011-04-06 MED ORDER — MORPHINE SULFATE 2 MG/ML IJ SOLN: 2.0000 mg | INTRAMUSCULAR | Status: DC | PRN

## 2011-04-06 MED ORDER — ACETAMINOPHEN 325 MG PO TABS
650.0000 mg | ORAL_TABLET | Freq: Four times a day (QID) | ORAL | Status: DC | PRN
  Administered 2011-04-07 (×2): 650 mg via ORAL
  Filled 2011-04-06 (×2): qty 2

## 2011-04-06 MED ORDER — CEFAZOLIN SODIUM 1-5 GM-% IV SOLN: 1.0000 g | Freq: Four times a day (QID) | INTRAVENOUS | Status: DC

## 2011-04-06 MED ORDER — ACETAMINOPHEN 650 MG RE SUPP: 650.0000 mg | Freq: Four times a day (QID) | RECTAL | Status: DC | PRN

## 2011-04-06 MED ORDER — METOCLOPRAMIDE HCL 10 MG PO TABS: 5.0000 mg | ORAL_TABLET | Freq: Three times a day (TID) | ORAL | Status: DC | PRN

## 2011-04-06 NOTE — Progress Notes (Signed)
PATIENT ID: [name]  MRN: [id number]  DOB/AGE:  1940-06-26 / 70 y.o.  1 Day Post-Op Procedure(s) (LRB): INTRAMEDULLARY (IM) NAIL FEMORAL (Left)  Subjective: Pain is mild.  No c/o chest pain or SOB.      Objective: Vital signs in last 24 hours: Temp:  [98 F (36.7 C)-98.9 F (37.2 C)] 98.8 F (37.1 C) (11/09 0600) Pulse Rate:  [63-87] 85  (11/09 0600) Resp:  [5-21] 14  (11/09 0600) BP: (100-127)/(49-76) 101/66 mmHg (11/09 0600) SpO2:  [91 %-99 %] 97 % (11/09 0600)  Intake/Output from previous day: 11/08 0701 - 11/09 0700 In: 3710 [P.O.:960; I.V.:2750] Out: 875 [Urine:775; Blood:100] Intake/Output this shift:     Basename 04/06/11 0433 04/04/11 2350  HGB 8.9* 13.8    Basename 04/06/11 0433 04/04/11 2350  WBC 7.1 11.8*  RBC 2.80* 4.36  HCT 27.2* 41.5  PLT 81* 148*    Basename 04/06/11 0433 04/04/11 2350  NA 140 142  K 4.2 3.7  CL 107 105  CO2 28 24  BUN 17 17  CREATININE 0.99 0.76  GLUCOSE 131* 131*  CALCIUM 8.5 10.0    Basename 04/04/11 2350  LABPT --  INR 0.88    Physical Exam: Neurovascular intact Sensation intact distally Dorsiflexion/Plantar flexion intact Incision: dressing C/D/I Compartment soft  Assessment/Plan: 1 Day Post-Op Procedure(s) (LRB): INTRAMEDULLARY (IM) NAIL FEMORAL (Left)   Advance diet Up with therapy D/C IV fluids Weight Bearing as Tolerated (WBAT)  VTE prophylaxis: recommnend SCDs and TEDs as she is a fall risk and a bleeding risk with Plavix. D/c foley   Mable Paris 04/06/2011, 7:46 AM

## 2011-04-06 NOTE — Progress Notes (Signed)
Provided patient/family with home health care agency list as resource if needed. Will continue to assess d/c needs.

## 2011-04-06 NOTE — Progress Notes (Signed)
Subjective: Patient is sitting up on chair. Feeling slightly groggy. No other complaints offered.  Objective: Vital signs in last 24 hours: Temp:  [98 F (36.7 C)-98.9 F (37.2 C)] 98.8 F (37.1 C) (11/09 1050) Pulse Rate:  [71-87] 86  (11/09 1050) Resp:  [12-16] 16  (11/09 1050) BP: (100-127)/(66-76) 113/68 mmHg (11/09 1050) SpO2:  [93 %-98 %] 97 % (11/09 0600) Weight change:  Last BM Date: 04/04/11  Intake/Output from previous day: 11/08 0701 - 11/09 0700 In: 3710 [P.O.:960; I.V.:2750] Out: 875 [Urine:775; Blood:100] Intake/Output this shift: Total I/O In: 60 [P.O.:60] Out: -   Physical Exam  Vitals reviewed. Constitutional: She appears well-developed and well-nourished.  HENT:  Head: Normocephalic and atraumatic.  Mouth/Throat: No oropharyngeal exudate.  Eyes: EOM are normal. Pupils are equal, round, and reactive to light. No scleral icterus.  Neck: Normal range of motion. Neck supple. No thyromegaly present.  Cardiovascular: Normal rate and regular rhythm.  Exam reveals no friction rub.   Murmur heard. Pulmonary/Chest: Effort normal. No respiratory distress. She has no wheezes.  Abdominal: Soft. Bowel sounds are normal. She exhibits no mass. There is no tenderness. There is no rebound.  Musculoskeletal: Normal range of motion. She exhibits no edema and no tenderness.  Neurological: She is alert.       Slightly sluggish at times. No focal deficits.  Skin: Skin is warm and dry. No pallor.  Psychiatric: She has a normal mood and affect.    Lab Results:  [facility] 04/06/11 0433 04/04/11 2350  WBC 7.1 11.8*  HGB 8.9* 13.8  HCT 27.2* 41.5  PLT 81* 148*   BMET  Basename 04/06/11 0433 04/04/11 2350  NA 140 142  K 4.2 3.7  CL 107 105  CO2 28 24  GLUCOSE 131* 131*  BUN 17 17  CREATININE 0.99 0.76  CALCIUM 8.5 10.0    Results for orders placed during the hospital encounter of 04/04/11 (from the past 24 hour(s))  CBC     Status: Abnormal   Collection Time    04/06/11  4:33 AM      Component Value Range   WBC 7.1  4.0 - 10.5 (K/uL)   RBC 2.80 (*) 3.87 - 5.11 (MIL/uL)   Hemoglobin 8.9 (*) 12.0 - 15.0 (g/dL)   HCT 16.1 (*) 09.6 - 46.0 (%)   MCV 97.1  78.0 - 100.0 (fL)   MCH 31.8  26.0 - 34.0 (pg)   MCHC 32.7  30.0 - 36.0 (g/dL)   RDW 04.5  40.9 - 81.1 (%)   Platelets 81 (*) 150 - 400 (K/uL)  BASIC METABOLIC PANEL     Status: Abnormal   Collection Time   04/06/11  4:33 AM      Component Value Range   Sodium 140  135 - 145 (mEq/L)   Potassium 4.2  3.5 - 5.1 (mEq/L)   Chloride 107  96 - 112 (mEq/L)   CO2 28  19 - 32 (mEq/L)   Glucose, Bld 131 (*) 70 - 99 (mg/dL)   BUN 17  6 - 23 (mg/dL)   Creatinine, Ser 9.14  0.50 - 1.10 (mg/dL)   Calcium 8.5  8.4 - 78.2 (mg/dL)   GFR calc non Af Amer 56 (*) >90 (mL/min)   GFR calc Af Amer 65 (*) >90 (mL/min)    Studies/Results: Dg Chest 2 View  04/05/2011  *RADIOLOGY REPORT*  Clinical Data: Preop  CHEST - 2 VIEW  Comparison: 02/01/2010  Findings: Heart is mildly enlarged.  Lungs  are clear.  No pneumothorax.  No pleural fluid. Upper thoracic compression deformities are stable.  Hyperaeration.  IMPRESSION: Cardiomegaly without edema.  Original Report Authenticated By: Donavan Burnet, M.D.   Dg Hip Complete Left  04/04/2011  *RADIOLOGY REPORT*  Clinical Data: Fall  LEFT HIP - COMPLETE 2+ VIEW  Comparison: 11/24/2010  Findings: There is lucency through the intertrochanteric region of the left proximal femur worrisome for fracture.  Osteopenia.  Bony framework is otherwise intact.  Vascular calcifications.  IMPRESSION: Findings are worrisome for a left intertrochanteric femur fracture. Confirmation with MRI or CT is recommended.  Original Report Authenticated By: Donavan Burnet, M.D.   Dg Femur Left  04/05/2011  *RADIOLOGY REPORT*  Clinical Data: The intertrochanteric fracture of the left hip.  LEFT FEMUR - 2 VIEW  Comparison: CT scan of 04/05/2011  Findings:  The patient has undergone insertion of an  intramedullary rod and an intertrochanteric nail into the femoral head.  Alignment of the fracture fragments is anatomic.  IMPRESSION: Open reduction and internal fixation of the intertrochanteric fracture of the proximal left femur.  Original Report Authenticated By: Gwynn Burly, M.D.   Ct Hip Left Wo Contrast  04/05/2011  *RADIOLOGY REPORT*  Clinical Data: Fall  CT OF THE LEFT HIP WITHOUT CONTRAST  Technique:  Multidetector CT imaging was performed according to the standard protocol. Multiplanar CT image reconstructions were also generated.  Comparison: None.  Findings: Intertrochanteric femur fracture with impaction and minimal displacement is present.  Acetabulum is intact.  Foley catheter decompresses the bladder.  IMPRESSION: Acute left intertrochanteric femur fracture.  Original Report Authenticated By: Donavan Burnet, M.D.   Dg C-arm 1-60 Min-no Report  04/05/2011  CLINICAL DATA: trochanteric nail   C-ARM 1-60 MINUTES  Fluoroscopy was utilized by the requesting physician.  No radiographic  interpretation.      Medications:  Scheduled:    . amLODipine  5 mg Oral Daily  . aspirin  81 mg Oral Daily  . ceFAZolin (ANCEF) IV  1 g Intravenous Q8H  . docusate sodium  100 mg Oral BID  . enoxaparin  40 mg Subcutaneous Q24H  . HYDROmorphone      . magnesium oxide  400 mg Oral BID  . metoprolol  50 mg Oral BID  . promethazine      . rosuvastatin  40 mg Oral Daily  . DISCONTD: ceFAZolin (ANCEF) IV  1 g Intravenous Q6H    Principal Problem:  *Hip fracture Active Problems:  DYSLIPIDEMIA  ANEMIA  Essential hypertension, benign  CAD, NATIVE VESSEL  DIASTOLIC DYSFUNCTION  PVD  DVT  PAD (peripheral artery disease)  Follicular lymphoma   Assessment/Plan: #1 left hip fracture, secondary to a fall: Patient is status post open reduction, internal fixation. She is stable currently. Management per orthopedic team.  #2 Anemia: Likely post-op drop in Hgb. Monitor for now. May need  transfusion if continues to drop.  #3 history of cardiac disease: CAD is stable. She does have diastolic dysfunction.   #4 history of peripheral vascular disease: This issue is stable as well.  #5 history of lymphoma in remission.   #6 DVT, prophylaxis as per orthopedic team.  #7 Confusion: Possibly from narcotics. Easily reoriented. Continue to monitor.  Discussed with her daughter who was at bedside. Daughter is trying to arrange 24hr supervision at home. If she is not able to or if patient doesn't progress as expected, will need SNF.    LOS: 2 days   Chalmer Zheng 04/06/2011, 2:09  PM

## 2011-04-06 NOTE — Progress Notes (Signed)
Bryna Colander Pa notified that patient didn't receive post op antibiotics today. Order received for 1v ancef x 2. Junie Panning Breella Vanostrand rn

## 2011-04-06 NOTE — Progress Notes (Signed)
Physical Therapy Evaluation Patient Details Name: [name] MRN: [id number] DOB: 12-20-1940 Today's Date: 04/06/2011 11:25-11:55 Eval 2  Problem List:  Patient Active Problem List  Diagnoses  . DYSLIPIDEMIA  . ANEMIA  . THROMBOCYTOPENIA  . Essential hypertension, benign  . AMI, INFERIOR WALL  . CAD, NATIVE VESSEL  . DIASTOLIC DYSFUNCTION  . ATHEROSCLEROSIS, NATIVE ARTERY, EXTREMITY, WITH CLAUDICATION  . PVD  . DVT  . LYMPHADENOPATHY  . CAROTID BRUIT  . PAD (peripheral artery disease)  . Follicular lymphoma  . Hip fracture    Past Medical History:  Past Medical History  Diagnosis Date  . Atherosclerosis of native arteries of the extremities with ulceration   . Lymphoma, non Hodgkin's   . Hypertension   . CAD (coronary artery disease)   . Myocardial infarction   . DVT (deep venous thrombosis)   . Follicular lymphoma dx'd 05/2009    chemo R-CHOP comp 09/2009; maintenance  Rituxan from 11/2009 to 09/2011  . Neuromuscular disorder 02/2009   Past Surgical History:  Past Surgical History  Procedure Date  . Coronary angioplasty with stent placement   . Femoral-popliteal bypass graft 02/15/10    Left CFA to above knee popliteal BPG by Dr. Leonides Sake  . Toe amputation 03/13/10    Left Great toe by Dr. Leonides Sake  . Angioplasty / stenting femoral 08/28/10    Left distal anastomosis, angioplasty w/stent by Dr. Leonides Sake  . Appendectomy   . Tonsillectomy   . Cholecystectomy     Gall bladder    PT Assessment/Plan/Recommendation PT Assessment Clinical Impression Statement: Pt would benefit from acute PT to address decreased mobility.  PT Recommendation/Assessment: Patient will need skilled PT in the acute care venue PT Problem List: Decreased strength;Decreased activity tolerance;Decreased balance;Decreased mobility;Decreased knowledge of use of DME;Pain Barriers to Discharge: Decreased caregiver support (Pt will likely need 24 hour assist, ? if daughter can do  thi) Barriers to Discharge Comments: ? if daughter can provide 24 hour assist.  PT Therapy Diagnosis : Difficulty walking;Acute pain;Generalized weakness PT Plan PT Frequency: Min 3X/week PT Treatment/Interventions: DME instruction;Gait training;Functional mobility training;Therapeutic exercise;Patient/family education PT Recommendation Recommendations for Other Services: OT consult Follow Up Recommendations: Skilled nursing facility (ST-SNF if family unable to provide 24 hour assist) Equipment Recommended: Defer to next venue PT Goals  Acute Rehab PT Goals PT Goal Formulation: With patient Time For Goal Achievement: 2 weeks Pt will go Supine/Side to Sit: with min assist PT Goal: Supine/Side to Sit - Progress: Not met Pt will Transfer Sit to Stand/Stand to Sit: with min assist PT Transfer Goal: Sit to Stand/Stand to Sit - Progress: Not met Pt will Ambulate: 16 - 50 feet PT Goal: Ambulate - Progress: Not met  PT Evaluation Precautions/Restrictions  Precautions Precautions: Fall Precaution Comments: h/o 2 falls in the past week, pt denies other prior falls Required Braces or Orthoses: No Restrictions Weight Bearing Restrictions: Yes LUE Weight Bearing: Weight bearing as tolerated Prior Functioning  Home Living Lives With: Daughter Receives Help From: Family (daughter works days) Home Layout: One level Home Access: Stairs to enter Entrance Stairs-Rails: Right Entrance Stairs-Number of Steps: 2 Home Adaptive Equipment: Bedside commode/3-in-1;Shower chair with back Prior Function Level of Independence: Independent with basic ADLs;Independent with gait Able to Take Stairs?: Yes Cognition Cognition Arousal/Alertness: Awake/alert Overall Cognitive Status: Appears within functional limits for tasks assessed Orientation Level: Oriented X4 Sensation/Coordination Sensation Light Touch: Appears Intact Coordination Gross Motor Movements are Fluid and Coordinated: Yes Fine Motor  Movements are  Fluid and Coordinated: Yes Extremity Assessment RUE Assessment RUE Assessment: Within Functional Limits LUE Assessment LUE Assessment: Within Functional Limits RLE Assessment RLE Assessment: Within Functional Limits LLE Assessment LLE Assessment: Exceptions to [facility] LLE Strength Left Hip Flexion: 2-/5 Mobility (including Balance) Bed Mobility Bed Mobility: Yes Supine to Sit: 1: +2 Total assist (patient 20%; assist to elevate trunk and advance LLE) Supine to Sit Details (indicate cue type and reason): pt 20% Sitting - Scoot to Edge of Bed: 2: Max assist Sitting - Scoot to [place] of Bed Details (indicate cue type and reason): pt 20% Transfers Transfers: Yes Sit to Stand: 1: +2 Total assist Sit to Stand Details (indicate cue type and reason): pt 10%, limited by pain, fatigue Stand Pivot Transfers: 1: +2 Total assist Stand Pivot Transfer Details (indicate cue type and reason): pt 35%; assist to initiate movement & for balance; pt has flexed posture in standing with RW Ambulation/Gait Ambulation/Gait: No Stairs: No Wheelchair Mobility Wheelchair Mobility: No  Posture/Postural Control Posture/Postural Control: Postural limitations Postural Limitations: flexed trunk/hips/knees in standing Balance Balance Assessed: Yes Static Sitting Balance Static Sitting - Balance Support: Bilateral upper extremity supported;Feet supported Static Sitting - Level of Assistance: 4: Min assist Static Sitting - Comment/# of Minutes: 4 Exercise  General Exercises - Lower Extremity Ankle Circles/Pumps: AROM;Both;5 reps Heel Slides: Left;AAROM;10 reps;Supine Hip ABduction/ADduction: AAROM;10 reps;Supine;Left End of Session PT - End of Session Equipment Utilized During Treatment: Gait belt (RW) Activity Tolerance: Patient limited by fatigue;Patient limited by pain Patient left: in chair;with call bell in reach Nurse Communication: Mobility status for transfers General Behavior During  Session: Lethargic Cognition: [facility] for tasks performed  Tamala Ser 04/06/2011, 12:43 PM

## 2011-04-06 NOTE — Progress Notes (Signed)
Patient referred for possible SNF placement. Underwent surgery for hip repair yesterday. Spoke with pt's daughter this morning. She is planning to take her mother home at d/c with home health and DME. She has a good friend who would plan to stay with her mother during the day while she works. Pt had been at [facility] in Nov. 2011 for short term rehab and then returned home with daughter. She feels that home is preferred over SNF.  Daughter will notify SW if unable to arrange for home care with her friend. Discussed with Cori Razor, LCSW.  Darylene Price, BSW SW Intern  04/06/2011 [phone]

## 2011-04-06 NOTE — Clinical Documentation Improvement (Signed)
Anemia Documentation Clarification Query  Dear Dr. Neysa Bonito  In an effort to better capture your patient's severity of illness, reflect appropriate length of stay and utilization of resources, a review of the patient medical record has revealed the following indicators.     Pt admitted with anemia and thrombocytopenia.  Based on lab results of H/H=8.9/27.2 and Plt=81, please clarify whether or  not the thrombocytopenia and anemia can be further specified by one of the diagnoses listed below and document in pn or d/c summary. Please document all that apply.  " Pancytopenia  " Pernicious anemia  " Aplastic anemia  " Acute Blood Loss Anemia "  " Other Condition____________  " Cannot Clinically Determine Signs and Symptoms:  Supporting Information:  Risk Factors:  Anemia, Thrombocytopenia, INTRAMEDULLARY (IM) NAIL FEMORAL, PVD, PAD.  Diagnostics: 04/06/11 0433   HGB: 8.9 (L) HCT: 27.2 (L)   PLT+ 81 (Low)   EBL:  Treatment: Monitoring H/H   You may use possible, probable, or suspect with inpatient documentation. Possible, probable, suspected diagnoses MUST be documented at the time of discharge. Based on your clinical judgment, please clarify and document in a progress note and/or discharge summary the clinical condition associated with the following supporting information:  In responding to this query please exercise your independent judgment.  The fact that a query is asked, does not imply that any particular answer is desired or expected  .Reviewed: additional documentation in the medical record   Thank You,  Sincerely, Enis Slipper  Clinical Documentation Specialist:  Pager: [phone]  Health Information Management [place]

## 2011-04-07 ENCOUNTER — Inpatient Hospital Stay (HOSPITAL_COMMUNITY): Payer: Medicare Other

## 2011-04-07 ENCOUNTER — Encounter (HOSPITAL_COMMUNITY): Payer: Self-pay | Admitting: *Deleted

## 2011-04-07 DIAGNOSIS — R410 Disorientation, unspecified: Secondary | ICD-10-CM | POA: Diagnosis not present

## 2011-04-07 DIAGNOSIS — D696 Thrombocytopenia, unspecified: Secondary | ICD-10-CM | POA: Diagnosis not present

## 2011-04-07 DIAGNOSIS — D62 Acute posthemorrhagic anemia: Secondary | ICD-10-CM | POA: Diagnosis not present

## 2011-04-07 LAB — BASIC METABOLIC PANEL
CO2: 28 mEq/L (ref 19–32)
Calcium: 8.2 mg/dL — ABNORMAL LOW (ref 8.4–10.5)
GFR calc Af Amer: 71 mL/min — ABNORMAL LOW (ref >90)
GFR calc non Af Amer: 62 mL/min — ABNORMAL LOW (ref >90)
Sodium: 136 mEq/L (ref 135–145)

## 2011-04-07 LAB — CBC
MCH: 31.9 pg (ref 26.0–34.0)
Platelets: 75 10*3/uL — ABNORMAL LOW (ref 150–400)
RBC: 2.26 MIL/uL — ABNORMAL LOW (ref 3.87–5.11)
RDW: 14.1 % (ref 11.5–15.5)

## 2011-04-07 LAB — PREPARE RBC (CROSSMATCH)

## 2011-04-07 MED ORDER — FUROSEMIDE 10 MG/ML IJ SOLN: 20.0000 mg | Freq: Once | INTRAMUSCULAR | Status: DC

## 2011-04-07 MED ORDER — FUROSEMIDE 10 MG/ML IJ SOLN
20.0000 mg | Freq: Once | INTRAMUSCULAR | Status: AC
  Administered 2011-04-07: 20 mg via INTRAVENOUS

## 2011-04-07 MED ORDER — FUROSEMIDE 10 MG/ML IJ SOLN
20.0000 mg | Freq: Once | INTRAMUSCULAR | Status: AC
  Administered 2011-04-07: 20 mg via INTRAVENOUS
  Filled 2011-04-07 (×3): qty 2

## 2011-04-07 NOTE — Progress Notes (Signed)
LM for daughter regarding D/c plans for Pt.

## 2011-04-07 NOTE — Progress Notes (Signed)
PT deferred secondary to pt Hgb 7.2 today.  Attempt again as schedule permits and pt medically ready. [phone]

## 2011-04-07 NOTE — Progress Notes (Signed)
Called and spoke with lynch np this am about pt's hgb of 7.2. NP stated she would let md know.

## 2011-04-07 NOTE — Progress Notes (Signed)
Pt with hgb of 7.2 this am. texted md on call awaiting call back. Pt in bed w/o any s/s of distress or pain at this time.

## 2011-04-07 NOTE — Progress Notes (Signed)
PATIENT ID:      [name]  MRN:     [id number] DOB/AGE:    1940-12-25 / 70 y.o.    PROGRESS NOTE Subjective: C/o tiredness. Moderate left hip pain   Tolerating Diet: yes         Patient reports pain as 5 on 0-10 scale.    Objective: Vital signs in last 24 hours: Temp:  [97.9 F (36.6 C)-100.1 F (37.8 C)] 98.1 F (36.7 C) (11/10 0845) Pulse Rate:  [73-99] 99  (11/10 0417) Resp:  [12-18] 18  (11/10 0417) BP: (76-116)/(52-72) 100/64 mmHg (11/10 0417) SpO2:  [42 %-96 %] 95 % (11/10 0417)    Intake/Output from previous day: I/O last 3 completed shifts: In: 2430 [P.O.:880; I.V.:1550] Out: 925 [Urine:925]   Intake/Output this shift:     LABORATORY DATA: Lab Results  Component Value Date   WBC 7.6 04/07/2011   HGB 7.2* 04/07/2011   HCT 22.3* 04/07/2011   MCV 98.7 04/07/2011   PLT 75* 04/07/2011   Lab Results  Component Value Date   NA 136 04/07/2011   K 4.0 04/07/2011   CL 105 04/07/2011   CO2 28 04/07/2011   BUN 16 04/07/2011   CREATININE 0.92 04/07/2011   GLUCOSE 134* 04/07/2011   CALCIUM 8.2* 04/07/2011   Lab Results  Component Value Date   INR 0.88 04/04/2011   PROTIME 50.4* 01/27/2010    Examination:  Wound: Benign, no sign of infection Neurovascular: Intact distally  Assessment:    POD #2 s/p Procedure(s) (LRB): INTRAMEDULLARY (IM) NAIL FEMORAL (Left) Acute blood loss anemia Plan: PT as ordered Weight Bearing as Tolerated (WBAT)  DVT Prophylaxis:  per medicine service  Transfuse 2 units pRBCs today.   Raylen Ken G, PA 04/07/2011, 9:48 AM

## 2011-04-07 NOTE — Progress Notes (Signed)
Subjective: Patient is confused. She recognized her daughter. She thought she wasn't [place] [facility]. She's unable to tell me any complaints at this time. She mentions pain in the left wrist and right ankle, but has not been very specific.  Objective: Vital signs in last 24 hours: Temp:  [97.9 F (36.6 C)-100.1 F (37.8 C)] 98.1 F (36.7 C) (11/10 0845) Pulse Rate:  [73-99] 99  (11/10 0417) Resp:  [12-18] 18  (11/10 0417) BP: (76-116)/(52-72) 100/64 mmHg (11/10 0417) SpO2:  [42 %-96 %] 95 % (11/10 0417) Weight change:  Last BM Date: 04/04/11  Intake/Output from previous day: 11/09 0701 - 11/10 0700 In: 760 [P.O.:160; I.V.:600] Out: 550 [Urine:550] Intake/Output this shift:    General appearance: alert, distracted, fatigued and no distress Head: Normocephalic, without obvious abnormality, atraumatic Throat: lips, mucosa, and tongue normal; teeth and gums normal Resp: diminished breath sounds RLL and rales RLL Cardio: regular rate and rhythm, S1, S2 normal, no murmur, click, rub or gallop GI: soft, non-tender; bowel sounds normal; no masses,  no organomegaly Neurologic: Mental status: Disoriented to place, time, date, year. Did recognize her daughter. Moving all extremities except left leg. No gross neurological deficits. Not moving left leg probably due to recent surgery.  Lab Results:  [facility] 04/07/11 0425 04/06/11 0433  WBC 7.6 7.1  HGB 7.2* 8.9*  HCT 22.3* 27.2*  PLT 75* 81*   BMET  Basename 04/07/11 0425 04/06/11 0433  NA 136 140  K 4.0 4.2  CL 105 107  CO2 28 28  GLUCOSE 134* 131*  BUN 16 17  CREATININE 0.92 0.99  CALCIUM 8.2* 8.5    Studies/Results: No results found.  Medications:  Scheduled:   . amLODipine  5 mg Oral Daily  . aspirin  81 mg Oral Daily  . docusate sodium  100 mg Oral BID  . enoxaparin  40 mg Subcutaneous Q24H  . furosemide  20 mg Intravenous Once  . furosemide  20 mg Intravenous Once  . magnesium oxide  400 mg Oral BID  .  metoprolol  50 mg Oral BID  . rosuvastatin  40 mg Oral Daily  . DISCONTD: furosemide  20 mg Intravenous Once    Principal Problem:  *Hip fracture Active Problems:  DYSLIPIDEMIA  ANEMIA  Essential hypertension, benign  CAD, NATIVE VESSEL  DIASTOLIC DYSFUNCTION  PVD  DVT  PAD (peripheral artery disease)  Follicular lymphoma  Confusion  Anemia due to blood loss, acute   Assessment/Plan: #1 left hip fracture, secondary to a fall: Patient is status post open reduction, internal fixation. She is stable currently. Management per orthopedic team.   #2 Anemia: Likely post-op drop in Hgb. Will transfuse 2 units per ortho. Lasix before and between units. ECHO from 2011 reported normal EF.  #3 Confusion: Possibly from narcotics and hospital stay. Continue to monitor. No need for imaging studies at this time.  #4 SOB: Mild dyspnea noted and due to crackles in right base will proceed with CXR.  #5 Thrombocytopenia: Etiology unclear. Monitor. Not on heparin products.  #6 history of cardiac disease: CAD is stable. She does have diastolic dysfunction. Lasix with transfusion.  #7 history of peripheral vascular disease: This issue is stable as well.   #8 history of lymphoma in remission.   #9 DVT, prophylaxis as per orthopedic team. Dr. Ave Filter had mentioned no heparin products due to high risk of bleeding.  Discussed with her daughter who was at bedside. Daughter is trying to arrange 24hr supervision at home. If  she is not able to or if patient doesn't progress as expected, will need SNF.   LOS: 3 days   [name] 04/07/2011, 12:50 PM

## 2011-04-08 ENCOUNTER — Inpatient Hospital Stay (HOSPITAL_COMMUNITY): Payer: Medicare Other

## 2011-04-08 LAB — TYPE AND SCREEN
ABO/RH(D): A POS
Antibody Screen: NEGATIVE
Unit division: 0

## 2011-04-08 LAB — BASIC METABOLIC PANEL
BUN: 14 mg/dL (ref 6–23)
Calcium: 9.1 mg/dL (ref 8.4–10.5)
Creatinine, Ser: 0.81 mg/dL (ref 0.50–1.10)
GFR calc non Af Amer: 72 mL/min — ABNORMAL LOW (ref >90)
Glucose, Bld: 136 mg/dL — ABNORMAL HIGH (ref 70–99)
Sodium: 139 mEq/L (ref 135–145)

## 2011-04-08 LAB — CBC
Platelets: 76 10*3/uL — ABNORMAL LOW (ref 150–400)
RBC: 3.41 MIL/uL — ABNORMAL LOW (ref 3.87–5.11)
RDW: 15.8 % — ABNORMAL HIGH (ref 11.5–15.5)
WBC: 8.6 10*3/uL (ref 4.0–10.5)

## 2011-04-08 MED ORDER — FUROSEMIDE 10 MG/ML IJ SOLN
40.0000 mg | Freq: Four times a day (QID) | INTRAMUSCULAR | Status: AC
  Administered 2011-04-08 (×2): 40 mg via INTRAVENOUS
  Filled 2011-04-08 (×3): qty 4

## 2011-04-08 MED ORDER — IOHEXOL 300 MG/ML  SOLN
100.0000 mL | Freq: Once | INTRAMUSCULAR | Status: AC | PRN
  Administered 2011-04-08: 100 mL via INTRAVENOUS

## 2011-04-08 MED ORDER — MOXIFLOXACIN HCL IN NACL 400 MG/250ML IV SOLN
400.0000 mg | INTRAVENOUS | Status: DC
  Administered 2011-04-08: 400 mg via INTRAVENOUS
  Filled 2011-04-08 (×2): qty 250

## 2011-04-08 NOTE — Progress Notes (Signed)
Subjective: Overnight events noted. The patient got short of breath overnight with the hypoxia. Currently she's feeling better. She required oxygen by Ventimask. Currently, she is on nasal cannula. She denied any chest pains. She's also feeling more awake and alert. Today. She said she has ambulated to the bathroom.  Objective: Vital signs in last 24 hours: Temp:  [98.1 F (36.7 C)-100.1 F (37.8 C)] 100.1 F (37.8 C) (11/11 0602) Pulse Rate:  [80-102] 93  (11/11 0602) Resp:  [16-24] 24  (11/11 0602) BP: (103-142)/(63-84) 128/84 mmHg (11/11 0602) SpO2:  [75 %-92 %] 90 % (11/11 0602) FiO2 (%):  [30 %] 30 % (11/11 0602) Weight change:  Last BM Date: 04/04/11  Intake/Output from previous day: 11/10 0701 - 11/11 0700 In: 1489.6 [I.V.:500; Blood:989.6] Out: 3200 [Urine:3200] Intake/Output this shift: Total I/O In: 200 [P.O.:200] Out: 1 [Stool:1]  General appearance: alert, cooperative and no distress Head: Normocephalic, without obvious abnormality, atraumatic Resp: rales bibasilar and bilaterally Cardio: regular rate and rhythm, S1, S2 normal, no murmur, click, rub or gallop GI: soft, non-tender; bowel sounds normal; no masses,  no organomegaly Skin: Skin color, texture, turgor normal. No rashes or lesions Neurologic: Alert and oriented X 3, normal strength and tone. Normal symmetric reflexes. Normal coordination and gait  Lab Results:  [facility] 04/08/11 0510 04/07/11 0425  WBC 8.6 7.6  HGB 10.3* 7.2*  HCT 31.2* 22.3*  PLT 76* 75*   BMET  Basename 04/08/11 0510 04/07/11 0425  NA 139 136  K 3.6 4.0  CL 101 105  CO2 31 28  GLUCOSE 136* 134*  BUN 14 16  CREATININE 0.81 0.92  CALCIUM 9.1 8.2*    Studies/Results: Dg Chest Portable 1 View  04/07/2011  *RADIOLOGY REPORT*  Clinical Data: Shortness of breath, cough  PORTABLE CHEST - 1 VIEW  Comparison: 04/05/2011  Findings: The lungs are essentially clear. No pleural effusion or pneumothorax.  Cardiomediastinal silhouette  is within normal limits.  IMPRESSION: No evidence of acute cardiopulmonary disease.  Original Report Authenticated By: Charline Bills, M.D.    Medications:  Scheduled:   . amLODipine  5 mg Oral Daily  . aspirin  81 mg Oral Daily  . docusate sodium  100 mg Oral BID  . enoxaparin  40 mg Subcutaneous Q24H  . furosemide  20 mg Intravenous Once  . furosemide  20 mg Intravenous Once  . magnesium oxide  400 mg Oral BID  . metoprolol  50 mg Oral BID  . rosuvastatin  40 mg Oral Daily    Principal Problem:  *Hip fracture Active Problems:  DYSLIPIDEMIA  ANEMIA  Essential hypertension, benign  CAD, NATIVE VESSEL  DIASTOLIC DYSFUNCTION  PVD  DVT  PAD (peripheral artery disease)  Follicular lymphoma  Confusion  Anemia due to blood loss, acute  Thrombocytopenia   Assessment/Plan: #1 left hip fracture, secondary to a fall: Patient is status post open reduction, internal fixation. She is stable currently. Management per orthopedic team.   #2 SOB and Hypoxia: Reason not entirely clear. VTE is a possibility considering recent surgery and immobilization. D/W Daughter and will proceed with CTA for now.  #3 Anemia: Likely post-op drop in Hgb. Will transfuse 2 units per ortho. Lasix before and between units. ECHO from 2011 reported normal EF. HGb better. Continue to monitor.  #4 Confusion: Much improved! Possibly from narcotics and hospital stay. Continue to monitor.  #5 Thrombocytopenia: Stable. Etiology unclear. Monitor. Not on heparin products.   #6 history of cardiac disease: CAD is stable. She  does have diastolic dysfunction  #7 history of peripheral vascular disease: This issue is stable as well.   #8 history of lymphoma in remission.   #9 DVT, prophylaxis as per orthopedic team. Dr. Ave Filter had mentioned no heparin products due to high risk of bleeding.   Daughter is trying to arrange 24hr supervision at home. If she is not able to or if patient doesn't progress as expected,  will need SNF.   LOS: 4 days   [name] 04/08/2011, 12:48 PM

## 2011-04-08 NOTE — Progress Notes (Addendum)
Pt's 02 sats 75% on 3L [place] I consulted with RT and as recommended pt placed on 30% Veni Mask @ 6L sats now 91% pt being monitored on cont. Pulse ox.  Idalia Needle, RN Entered at  [phone]

## 2011-04-08 NOTE — Progress Notes (Signed)
Occupational Therapy Evaluation Patient Details Name: [name] MRN: [id number] DOB: 06-04-1940 Today's Date: 04/08/2011 12:20  Problem List:  Patient Active Problem List  Diagnoses  . DYSLIPIDEMIA  . ANEMIA  . THROMBOCYTOPENIA  . Essential hypertension, benign  . AMI, INFERIOR WALL  . CAD, NATIVE VESSEL  . DIASTOLIC DYSFUNCTION  . ATHEROSCLEROSIS, NATIVE ARTERY, EXTREMITY, WITH CLAUDICATION  . PVD  . DVT  . LYMPHADENOPATHY  . CAROTID BRUIT  . PAD (peripheral artery disease)  . Follicular lymphoma  . Hip fracture  . Confusion  . Anemia due to blood loss, acute  . Thrombocytopenia    Past Medical History:  Past Medical History  Diagnosis Date  . Atherosclerosis of native arteries of the extremities with ulceration   . Lymphoma, non Hodgkin's   . Hypertension   . CAD (coronary artery disease)   . Myocardial infarction   . DVT (deep venous thrombosis)   . Follicular lymphoma dx'd 05/2009    chemo R-CHOP comp 09/2009; maintenance  Rituxan from 11/2009 to 09/2011  . Neuromuscular disorder 02/2009   Past Surgical History:  Past Surgical History  Procedure Date  . Coronary angioplasty with stent placement   . Femoral-popliteal bypass graft 02/15/10    Left CFA to above knee popliteal BPG by Dr. Leonides Sake  . Toe amputation 03/13/10    Left Great toe by Dr. Leonides Sake  . Angioplasty / stenting femoral 08/28/10    Left distal anastomosis, angioplasty w/stent by Dr. Leonides Sake  . Appendectomy   . Tonsillectomy   . Cholecystectomy     Gall bladder    OT Assessment/Plan/Recommendation   OT Goals    OT Evaluation Precautions/Restrictions  Precautions Precautions: Fall Precaution Comments: h/o 2 falls in the past week, pt denies other prior falls Required Braces or Orthoses: No Restrictions Weight Bearing Restrictions: Yes LUE Weight Bearing: Weight bearing as tolerated LLE Weight Bearing: Partial weight bearing Prior Functioning     ADL     Vision/Perception    Cognition   Sensation/Coordination   Extremity Assessment   Mobility    Exercises   End of Session  Eval attempted however RN requested pt. Be seen for exercises only. Pt. Not medically ready for mobilization with OT due to recent O2 desaturation. OT to re-attempt as appropriate   [name] 04/08/2011, 1:23 PM

## 2011-04-08 NOTE — Progress Notes (Signed)
PATIENT ID:      [name]  MRN:     [id number] DOB/AGE:    [birth year] / 70 y.o.    PROGRESS NOTE Subjective: Minimal c/o's. No significant hip pain.   Tolerating Diet: yes         Patient reports pain as 2 on 0-10 scale.    Objective: Vital signs in last 24 hours: Temp:  [98.1 F (36.7 C)-100.1 F (37.8 C)] 100.1 F (37.8 C) (11/11 0602) Pulse Rate:  [80-102] 93  (11/11 0602) Resp:  [16-24] 24  (11/11 0602) BP: (103-142)/(63-84) 128/84 mmHg (11/11 0602) SpO2:  [75 %-92 %] 90 % (11/11 0602) FiO2 (%):  [30 %] 30 % (11/11 0602)    Intake/Output from previous day: I/O last 3 completed shifts: In: 1489.6 [I.V.:500; Blood:989.6] Out: 3550 [Urine:3550]   Intake/Output this shift:     LABORATORY DATA: Lab Results  Component Value Date   WBC 8.6 04/08/2011   HGB 10.3* 04/08/2011   HCT 31.2* 04/08/2011   MCV 91.5 04/08/2011   PLT 76* 04/08/2011   Lab Results  Component Value Date   NA 139 04/08/2011   K 3.6 04/08/2011   CL 101 04/08/2011   CO2 31 04/08/2011   BUN 14 04/08/2011   CREATININE 0.81 04/08/2011   GLUCOSE 136* 04/08/2011   CALCIUM 9.1 04/08/2011   Lab Results  Component Value Date   INR 0.88 04/04/2011   PROTIME 50.4* 01/27/2010    Examination:  Left leg dressings clean and dry. Neurovascular: Intact distally Calf soft. Assessment:    POD #3 s/p Procedure(s) (LRB): INTRAMEDULLARY (IM) NAIL FEMORAL (Left) Acute blood loss anemia    Improved s/p transfusion. Plan: PT as ordered Weight Bearing as Tolerated (WBAT)  DVT Prophylaxis:  per medicine  Will follow along.  Chandlar Guice G, PA 04/08/2011, 9:43 AM

## 2011-04-09 ENCOUNTER — Encounter (HOSPITAL_COMMUNITY): Payer: Self-pay | Admitting: Orthopedic Surgery

## 2011-04-09 LAB — BASIC METABOLIC PANEL
Calcium: 8.6 mg/dL (ref 8.4–10.5)
GFR calc non Af Amer: 58 mL/min — ABNORMAL LOW (ref >90)
Glucose, Bld: 102 mg/dL — ABNORMAL HIGH (ref 70–99)
Sodium: 139 mEq/L (ref 135–145)

## 2011-04-09 LAB — CBC
Hemoglobin: 10.1 g/dL — ABNORMAL LOW (ref 12.0–15.0)
MCH: 30.6 pg (ref 26.0–34.0)
MCHC: 33.6 g/dL (ref 30.0–36.0)

## 2011-04-09 MED ORDER — MOXIFLOXACIN HCL 400 MG PO TABS
400.0000 mg | ORAL_TABLET | Freq: Every day | ORAL | Status: DC
  Administered 2011-04-09 – 2011-04-10 (×2): 400 mg via ORAL
  Filled 2011-04-09 (×3): qty 1

## 2011-04-09 MED ORDER — POTASSIUM CHLORIDE CRYS ER 20 MEQ PO TBCR
40.0000 meq | EXTENDED_RELEASE_TABLET | ORAL | Status: AC
  Administered 2011-04-09 (×3): 40 meq via ORAL
  Filled 2011-04-09 (×5): qty 2

## 2011-04-09 MED ORDER — FUROSEMIDE 10 MG/ML IJ SOLN
40.0000 mg | Freq: Once | INTRAMUSCULAR | Status: AC
  Administered 2011-04-09: 40 mg via INTRAVENOUS
  Filled 2011-04-09: qty 4

## 2011-04-09 NOTE — Progress Notes (Signed)
Occupational Therapy Evaluation Patient Details Name: [name] MRN: [id number] DOB: March 17, 1941 Today's Date: 04/09/2011 Time: 09:52-10:20 am Pt seen for OT eval in conjunction with PT today Problem List:  Patient Active Problem List  Diagnoses  . DYSLIPIDEMIA  . ANEMIA  . THROMBOCYTOPENIA  . Essential hypertension, benign  . AMI, INFERIOR WALL  . CAD, NATIVE VESSEL  . DIASTOLIC DYSFUNCTION  . ATHEROSCLEROSIS, NATIVE ARTERY, EXTREMITY, WITH CLAUDICATION  . PVD  . DVT  . LYMPHADENOPATHY  . CAROTID BRUIT  . PAD (peripheral artery disease)  . Follicular lymphoma  . Hip fracture  . Confusion  . Anemia due to blood loss, acute  . Thrombocytopenia    Past Medical History:  Past Medical History  Diagnosis Date  . Atherosclerosis of native arteries of the extremities with ulceration   . Lymphoma, non Hodgkin's   . Hypertension   . CAD (coronary artery disease)   . Myocardial infarction   . DVT (deep venous thrombosis)   . Follicular lymphoma dx'd 05/2009    chemo R-CHOP comp 09/2009; maintenance  Rituxan from 11/2009 to 09/2011  . Neuromuscular disorder 02/2009   Past Surgical History:  Past Surgical History  Procedure Date  . Coronary angioplasty with stent placement   . Femoral-popliteal bypass graft 02/15/10    Left CFA to above knee popliteal BPG by Dr. Leonides Sake  . Toe amputation 03/13/10    Left Great toe by Dr. Leonides Sake  . Angioplasty / stenting femoral 08/28/10    Left distal anastomosis, angioplasty w/stent by Dr. Leonides Sake  . Appendectomy   . Tonsillectomy   . Cholecystectomy     Gall bladder    OT Assessment/Plan/Recommendation OT Assessment Clinical Impression Statement: pt will benefit from acute OT as well as HHOT w/ 24 hour supervision and PRN assist at discharge. Would like to d/c home. OT Recommendation/Assessment: Patient will need skilled OT in the acute care venue OT Problem List: Decreased activity tolerance;Decreased knowledge of  use of DME or AE;Other (comment) (h/o falls (x2 last 1 week)) Barriers to Discharge: Other (comment) (Lives with daughter can provide PRN assist) Barriers to Discharge Comments: Pt will need 24 hour supervision and PRN assist at d/c. Pt reports daughter not home 24/7, but is "going to arrange for someone to stay with me" OT Therapy Diagnosis : Acute pain;Other (comment) (Falls; decreased ADL's and funct mobility) OT Plan OT Frequency: Min 2X/week OT Treatment/Interventions: Self-care/ADL training;DME and/or AE instruction;Therapeutic activities;Patient/family education OT Recommendation Follow Up Recommendations: Home health OT;24 hour supervision/assistance Equipment Recommended: None recommended by OT;Other (comment) (had DME per pt) Individuals Consulted Consulted and Agree with Results and Recommendations: Patient OT Goals Acute Rehab OT Goals OT Goal Formulation: With patient ADL Goals Pt Will Perform Grooming: with supervision;Standing at sink;Other (comment) (x2 tasks) ADL Goal: Grooming - Progress: Progressing toward goals Pt Will Perform Lower Body Bathing: with min assist;Sitting, chair;with adaptive equipment;with cueing (comment type and amount) ADL Goal: Lower Body Bathing - Progress: Progressing toward goals Pt Will Perform Lower Body Dressing: with min assist;with caregiver independent in assisting;Sitting, chair;with adaptive equipment;with cueing (comment type and amount) ADL Goal: Lower Body Dressing - Progress: Progressing toward goals Additional ADL Goal #1: Pt will perform toileting with supervision all aspects (Transfer, hygeine & clothing management) using 3:1 over toilet and RW ADL Goal: Additional Goal #1 - Progress: Progressing toward goals  OT Evaluation Precautions/Restrictions  Precautions Precautions: Fall Precaution Comments: h/o 2 falls in the past week, pt denies other prior falls  Required Braces or Orthoses: No Restrictions Weight Bearing  Restrictions: Yes LUE Weight Bearing: Weight bearing as tolerated LLE Weight Bearing: Weight bearing as tolerated Prior Functioning Home Living Bathroom Shower/Tub: Walk-in Stage manager: Standard Bathroom Accessibility: Yes How Accessible: Accessible via walker Home Adaptive Equipment: Bedside commode/3-in-1;Shower chair with back Prior Function Level of Independence: Independent with basic ADLs;Independent with gait;Independent with transfers ADL ADL Eating/Feeding: Performed;Modified independent Where Assessed - Eating/Feeding: Chair Grooming: Performed;Wash/dry hands;Minimal assistance (Min guard assist) Grooming Details (indicate cue type and reason): Min guard assist stand a sink, with safety and sequencing with RW Where Assessed - Grooming: Standing at sink Upper Body Bathing: Simulated;Modified independent Where Assessed - Upper Body Bathing: Sitting, chair Lower Body Bathing: Simulated;Maximal assistance Lower Body Bathing Details (indicate cue type and reason): Max assist LB bathe secondary to recent sx L LE, should benefit from AE instruction Where Assessed - Lower Body Bathing: Sitting, chair Upper Body Dressing: Simulated;Modified independent Where Assessed - Upper Body Dressing: Sitting, chair Lower Body Dressing: Performed;Maximal assistance Lower Body Dressing Details (indicate cue type and reason): Don/Doff socks Where Assessed - Lower Body Dressing: Supine, head of bed up Toilet Transfer: Performed;Minimal assistance Toilet Transfer Details (indicate cue type and reason): Min guard assist w/ VC's, TC's for safety w/ RW and use of UE's to reach fro 3:1 over toilet (pt attempting to hold onto RW). Toilet Transfer Method: Proofreader: Raised toilet seat with arms (or 3-in-1 over toilet) Toileting - Clothing Manipulation: Performed;Minimal assistance Toileting - Clothing Manipulation Details (indicate cue type and reason): VC's safety  and sequencing Where Assessed - Toileting Clothing Manipulation: Sit on 3-in-1 or toilet;Sit to stand from 3-in-1 or toilet Toileting - Hygiene: Performed;Minimal assistance Toileting - Hygiene Details (indicate cue type and reason): Min guard for peri care after BM on commode over toilet Where Assessed - Toileting Hygiene: Sit on 3-in-1 or toilet;Sit to stand from 3-in-1 or toilet Equipment Used: Rolling walker Vision/Perception  Vision - History Baseline Vision: Wears glasses all the time Patient Visual Report: No change from baseline Cognition Cognition Arousal/Alertness: Awake/alert Overall Cognitive Status: Appears within functional limits for tasks assessed Orientation Level: Oriented X4 Sensation/Coordination Sensation Light Touch: Appears Intact Coordination Gross Motor Movements are Fluid and Coordinated: Yes Fine Motor Movements are Fluid and Coordinated: Yes Extremity Assessment RUE Assessment RUE Assessment: Within Functional Limits LUE Assessment LUE Assessment: Within Functional Limits Mobility  Bed Mobility Bed Mobility: Yes Supine to Sit: 3: Mod assist Supine to Sit Details (indicate cue type and reason): VC's and assist UE's hand placement and L LE to advance to EOB (Co-tx with PT) Sitting - Scoot to Edge of Bed: 3: Mod assist Sitting - Scoot to [employer] of Bed Details (indicate cue type and reason): VC's/TC's for sequencing and safety w/ assist lift/lower L LE Transfers Transfers: Yes Sit to Stand: 3: Mod assist Sit to Stand Details (indicate cue type and reason): VC's for asfety and sequencing w/ RW and hand placement from EOB (elevated) and 3:1 in bathroom (co-tx w/ PT). Exercises Other Exercises Other Exercises: Pt seen for functional mob in room from EOB-3:1 over toilet in bathroom and then back to chair (co-tx with PT). End of Session OT - End of Session Equipment Utilized During Treatment: Other (comment) (RW) Activity Tolerance: Patient tolerated  treatment well Patient left: in chair;with call bell in reach General Behavior During Session: [facility] for tasks performed Cognition: [facility] for tasks performed   Alm Bustard 04/09/2011, 10:55 AM

## 2011-04-09 NOTE — Progress Notes (Signed)
Physical Therapy Treatment Patient Details Name: [name] MRN: [id number] DOB: [birth year] Today's Date: 04/09/2011 [id number] GTA PT Assessment/Plan  PT - Assessment/Plan PT Plan: Discharge plan remains appropriate;Frequency remains appropriate PT Frequency: Min 3X/week Follow Up Recommendations: Skilled nursing facility Equipment Recommended: None recommended by OT;Other (comment) (had DME per pt) PT Goals  Acute Rehab PT Goals PT Goal: Supine/Side to Sit - Progress: Progressing toward goal PT Transfer Goal: Sit to Stand/Stand to Sit - Progress: Progressing toward goal PT Goal: Ambulate - Progress: Progressing toward goal  PT Treatment Precautions/Restrictions  Precautions Precautions: Fall Precaution Comments: h/o 2 falls in the past week, pt denies other prior falls Required Braces or Orthoses: No Restrictions Weight Bearing Restrictions: Yes LUE Weight Bearing: Weight bearing as tolerated LLE Weight Bearing: Weight bearing as tolerated Mobility (including Balance) Bed Mobility Bed Mobility: Yes Supine to Sit: 3: Mod assist Supine to Sit Details (indicate cue type and reason): VC's and assist UE's hand placement and L LE to advance to EOB (Co-tx with PT) Sitting - Scoot to Edge of Bed: 3: Mod assist Sitting - Scoot to Edge of Bed Details (indicate cue type and reason): VC's/TC's for sequencing and safety w/ assist lift/lower L LE Transfers Transfers: Yes Sit to Stand: 3: Mod assist Sit to Stand Details (indicate cue type and reason): VC's for asfety and sequencing w/ RW and hand placement from EOB (elevated) and 3:1 in bathroom (co-tx w/ PT). Ambulation/Gait Ambulation/Gait: Yes Ambulation/Gait Assistance: 4: Min assist Ambulation/Gait Assistance Details (indicate cue type and reason): Pt able to advance LLE, VC for posture Ambulation Distance (Feet): 20 Feet (20 ft. x 2) Assistive device: Rolling walker Gait Pattern: Step-to pattern Gait velocity: slow steady   Posture/Postural Control Posture/Postural Control:  (VC for stand erect) Balance Balance Assessed: No Exercise  Other Exercises Other Exercises: Pt seen for functional mob in room from EOB-3:1 over toilet in bathroom and then back to chair (co-tx with PT). End of Session PT - End of Session Activity Tolerance: Patient tolerated treatment well;Patient limited by fatigue Patient left: in chair;with call bell in reach Nurse Communication: Mobility status for ambulation General Behavior During Session: [facility] for tasks performed Cognition: [facility] for tasks performed  Rada Hay [phone] 04/09/2011, 10:56 AM

## 2011-04-09 NOTE — Progress Notes (Signed)
Subjective: Patient feels much better. Less SOb. Slight cough. No chest pain. No nausea or vomiting. Ambulated within the room.  Objective: Vital signs in last 24 hours: Temp:  [98.6 F (37 C)-99.4 F (37.4 C)] 98.6 F (37 C) (11/12 0541) Pulse Rate:  [81-95] 95  (11/12 1021) Resp:  [18-20] 18  (11/12 0541) BP: (101-121)/(67-72) 121/68 mmHg (11/12 1055) SpO2:  [94 %-97 %] 97 % (11/12 1021) Weight change:  Last BM Date: 04/08/11  Intake/Output from previous day: 11/11 0701 - 11/12 0700 In: 640 [P.O.:640] Out: 2902 [Urine:2900; Stool:2] Intake/Output this shift: Total I/O In: 480 [P.O.:480] Out: -   General appearance: alert, cooperative and no distress Throat: lips, mucosa, and tongue normal; teeth and gums normal Resp: Few crackles at the bases. No wheezing. Cardio: regular rate and rhythm, S1, S2 normal, no murmur, click, rub or gallop GI: soft, non-tender; bowel sounds normal; no masses,  no organomegaly Extremities: extremities normal, atraumatic, no cyanosis or edema Neurologic: Alert and oriented X 3, normal strength and tone. Normal symmetric reflexes. Normal coordination and gait Stable  Lab Results:  Basename 04/09/11 0421 04/08/11 0510  WBC 7.1 8.6  HGB 10.1* 10.3*  HCT 30.1* 31.2*  PLT 92* 76*   BMET  Basename 04/09/11 0421 04/08/11 0510  NA 139 139  K 3.1* 3.6  CL 97 101  CO2 34* 31  GLUCOSE 102* 136*  BUN 20 14  CREATININE 0.97 0.81  CALCIUM 8.6 9.1    Studies/Results: Ct Angio Chest W/cm &/or Wo Cm  04/08/2011  *RADIOLOGY REPORT*  Clinical Data:  Hypoxia, postop hip surgery, evaluate for PE  CT ANGIOGRAPHY CHEST WITH CONTRAST  Technique:  Multidetector CT imaging of the chest was performed using the standard protocol during bolus administration of intravenous contrast.  Multiplanar CT image reconstructions including MIPs were obtained to evaluate the vascular anatomy.  Contrast: OMNIPAQUE IOHEXOL 300 MG/ML IV SOLN  Comparison:  Chest  radiograph dated 04/07/2011.  CT chest dated 11/24/2010.  Findings:  No evidence of pulmonary embolism.  Mild centrilobular emphysema.  Scattered subpleural reticulation/ground-glass opacities, most prominent in the bilateral upper lobes, new from prior CT.  Dependent atelectasis in the bilateral lower lobes.  No pleural effusion or pneumothorax.  Visualized thyroid is unremarkable.  The heart is normal in size.  No pericardial effusion.  Coronary atherosclerosis.  Atherosclerotic calcification of the aortic arch. Mild ectasia of the ascending aorta measuring up to 3.9 cm in transverse dimension.  No suspicious mediastinal, hilar, or axillary lymphadenopathy.  Visualized upper abdomen is notable for a tiny hiatal hernia and cholecystectomy clips.  Mild degenerative changes of the visualized thoracolumbar spine.  Review of the MIP images confirms the above findings.  IMPRESSION: No evidence of pulmonary embolism.  Scattered subpleural reticulation/ground-glass opacities, most prominent in the bilateral upper lobes, new from prior CT.  This appearance is likely infectious, although early interstitial lung disease such as nonspecific interstitial pneumonitis is also possible.  Mild centrilobular emphysema.  Original Report Authenticated By: Charline Bills, M.D.   Dg Chest Portable 1 View  04/07/2011  *RADIOLOGY REPORT*  Clinical Data: Shortness of breath, cough  PORTABLE CHEST - 1 VIEW  Comparison: 04/05/2011  Findings: The lungs are essentially clear. No pleural effusion or pneumothorax.  Cardiomediastinal silhouette is within normal limits.  IMPRESSION: No evidence of acute cardiopulmonary disease.  Original Report Authenticated By: Charline Bills, M.D.    Medications:  Scheduled:   . amLODipine  5 mg Oral Daily  . aspirin  81 mg Oral Daily  . docusate sodium  100 mg Oral BID  . enoxaparin  40 mg Subcutaneous Q24H  . furosemide  40 mg Intravenous Q6H  . furosemide  40 mg Intravenous Once  .  magnesium oxide  400 mg Oral BID  . metoprolol  50 mg Oral BID  . moxifloxacin  400 mg Intravenous Q24H  . potassium chloride  40 mEq Oral Q4H  . rosuvastatin  40 mg Oral Daily    Principal Problem:  *Hip fracture Active Problems:  DYSLIPIDEMIA  ANEMIA  Essential hypertension, benign  CAD, NATIVE VESSEL  DIASTOLIC DYSFUNCTION  PVD  DVT  PAD (peripheral artery disease)  Follicular lymphoma  Confusion  Anemia due to blood loss, acute  Thrombocytopenia   Assessment/Plan: #1 left hip fracture, secondary to a fall: Patient is status post open reduction, internal fixation. She is stable currently. Management per orthopedic team.   #2 SOB and Hypoxia: Possibly mild pulmonary edema versus infection. Patient much improved with lasix. Give ome more dose today. Continue with avelox. No PE on CTA.  #3 Anemia: Stable. S/P transfusion.ECHO from 2011 reported normal EF. HGb better. Continue to monitor.   #4 Confusion: Much improved! Possibly from narcotics and hospital stay. Back to baseline.   #5 Thrombocytopenia: Improving. Etiology unclear. Monitor. Not on heparin products.   #6 history of cardiac disease: CAD is stable. She does have diastolic dysfunction   #7 history of peripheral vascular disease: This issue is stable as well.   #8 history of lymphoma in remission.   #9 DVT, prophylaxis as per orthopedic team. Dr. Ave Filter had mentioned no heparin products due to high risk of bleeding.   Patient will likely end up requiring SNF. Discussed with daughter and patient. Patient reluctant but willing to consider.  Likely discharge in 2-3 days.   LOS: 5 days   Chandra Feger 04/09/2011, 12:23 PM

## 2011-04-09 NOTE — Progress Notes (Signed)
This patient is receiving the antibiotic(s), Avelox, by the intravenous route. Based on criteria approved by the Pharmacy and Therapeutics Committee, and the Infectious Disease Division, the antibiotic(s) is / are being converted to equivalent oral dose form(s). These criteria include: . Patient being treated for a respiratory tract infection, urinary tract infection, cellulitis, or Clostridium Difficile Associated Diarrhea . The patient is not neutropenic and does not exhibit a GI malabsorption state . The patient is eating (either orally or per tube) and/or has been taking other orally administered medications for at least 24 hours. . The patient is improving clinically (physician assessment and a 24-hour Tmax of < 100.5 F).  If you have questions about this conversion, please contact the pharmacy department. Thank you.

## 2011-04-09 NOTE — Progress Notes (Signed)
PATIENT ID: [name]  MRN: [id number]  DOB/AGE:  07/10/1940 / 70 y.o.  4 Days Post-Op Procedure(s) (LRB): INTRAMEDULLARY (IM) NAIL FEMORAL (Left)  Subjective: Pain is mild.  No c/o chest pain or SOB.   CTA over weekend neg for PE.   Objective: Vital signs in last 24 hours: Temp:  [98.6 F (37 C)-99.4 F (37.4 C)] 98.6 F (37 C) (11/12 0541) Pulse Rate:  [81-93] 81  (11/12 0541) Resp:  [18-20] 18  (11/12 0541) BP: (101-113)/(67-72) 101/67 mmHg (11/12 0541) SpO2:  [94 %-96 %] 96 % (11/12 0541)  Intake/Output from previous day: 11/11 0701 - 11/12 0700 In: 640 [P.O.:640] Out: 2902 [Urine:2900; Stool:2] Intake/Output this shift:     Basename 04/09/11 0421 04/08/11 0510 04/07/11 0425  HGB 10.1* 10.3* 7.2*    Basename 04/09/11 0421 04/08/11 0510  WBC 7.1 8.6  RBC 3.30* 3.41*  HCT 30.1* 31.2*  PLT 92* 76*    Basename 04/09/11 0421 04/08/11 0510  NA 139 139  K 3.1* 3.6  CL 97 101  CO2 34* 31  BUN 20 14  CREATININE 0.97 0.81  GLUCOSE 102* 136*  CALCIUM 8.6 9.1   No results found for this basename: LABPT:2,INR:2 in the last 72 hours  Physical Exam: Neurovascular intact Sensation intact distally Intact pulses distally Dorsiflexion/Plantar flexion intact Incision: no drainage  Assessment/Plan: 4 Days Post-Op Procedure(s) (LRB): INTRAMEDULLARY (IM) NAIL FEMORAL (Left)   Up with therapy Weight Bearing as Tolerated (WBAT)  VTE prophylaxis: plavix, ASA dispo to SNF when medically ready   Mable Paris 04/09/2011, 7:30 AM

## 2011-04-10 DIAGNOSIS — R0600 Dyspnea, unspecified: Secondary | ICD-10-CM | POA: Diagnosis not present

## 2011-04-10 LAB — BASIC METABOLIC PANEL
CO2: 33 mEq/L — ABNORMAL HIGH (ref 19–32)
Calcium: 8.9 mg/dL (ref 8.4–10.5)
GFR calc Af Amer: 63 mL/min — ABNORMAL LOW (ref >90)
GFR calc non Af Amer: 54 mL/min — ABNORMAL LOW (ref >90)
Sodium: 137 mEq/L (ref 135–145)

## 2011-04-10 LAB — CBC
MCH: 31.2 pg (ref 26.0–34.0)
MCHC: 33.7 g/dL (ref 30.0–36.0)
Platelets: 116 10*3/uL — ABNORMAL LOW (ref 150–400)
RBC: 3.08 MIL/uL — ABNORMAL LOW (ref 3.87–5.11)

## 2011-04-10 MED ORDER — HYDROCODONE-ACETAMINOPHEN 5-325 MG PO TABS: 1.0000 | ORAL_TABLET | ORAL | Status: DC | PRN

## 2011-04-10 MED ORDER — POTASSIUM CHLORIDE CRYS ER 20 MEQ PO TBCR
20.0000 meq | EXTENDED_RELEASE_TABLET | Freq: Two times a day (BID) | ORAL | Status: DC
  Filled 2011-04-10 (×2): qty 1

## 2011-04-10 MED ORDER — DSS 100 MG PO CAPS: 100.0000 mg | ORAL_CAPSULE | Freq: Two times a day (BID) | ORAL | Status: AC

## 2011-04-10 MED ORDER — AMLODIPINE BESYLATE 5 MG PO TABS: 5.0000 mg | ORAL_TABLET | Freq: Every day | ORAL | Status: DC

## 2011-04-10 MED ORDER — POLYETHYLENE GLYCOL 3350 17 G PO PACK: 17.0000 g | PACK | Freq: Every day | ORAL | Status: AC | PRN

## 2011-04-10 MED ORDER — FUROSEMIDE 40 MG PO TABS
40.0000 mg | ORAL_TABLET | Freq: Every day | ORAL | Status: DC
  Administered 2011-04-10 – 2011-04-11 (×2): 40 mg via ORAL
  Filled 2011-04-10 (×2): qty 1

## 2011-04-10 MED ORDER — MOXIFLOXACIN HCL 400 MG PO TABS: 400.0000 mg | ORAL_TABLET | Freq: Every day | ORAL | Status: AC

## 2011-04-10 MED ORDER — POTASSIUM CHLORIDE CRYS ER 20 MEQ PO TBCR
20.0000 meq | EXTENDED_RELEASE_TABLET | Freq: Every day | ORAL | Status: DC
  Administered 2011-04-10 – 2011-04-11 (×2): 20 meq via ORAL
  Filled 2011-04-10 (×2): qty 1

## 2011-04-10 MED ORDER — METOPROLOL TARTRATE 50 MG PO TABS
50.0000 mg | ORAL_TABLET | Freq: Two times a day (BID) | ORAL | Status: DC
  Administered 2011-04-10 – 2011-04-11 (×2): 50 mg via ORAL
  Filled 2011-04-10 (×3): qty 1

## 2011-04-10 MED ORDER — POTASSIUM CHLORIDE CRYS ER 20 MEQ PO TBCR: 20.0000 meq | EXTENDED_RELEASE_TABLET | Freq: Every day | ORAL | Status: DC

## 2011-04-10 MED ORDER — ENOXAPARIN SODIUM 40 MG/0.4ML ~~LOC~~ SOLN: 40.0000 mg | SUBCUTANEOUS | Status: DC

## 2011-04-10 MED ORDER — METOPROLOL TARTRATE 50 MG PO TABS: 25.0000 mg | ORAL_TABLET | Freq: Two times a day (BID) | ORAL | Status: DC

## 2011-04-10 MED ORDER — FUROSEMIDE 40 MG PO TABS: 40.0000 mg | ORAL_TABLET | Freq: Every day | ORAL | Status: DC

## 2011-04-10 NOTE — Progress Notes (Signed)
Physical Therapy Treatment Patient Details Name: [name] MRN: [id number] DOB: [birth year] Today's Date: 04/10/2011 [id number] PT Assessment/Plan  PT - Assessment/Plan Comments on Treatment Session: pt tolerated very well PT Plan: Discharge plan remains appropriate;Frequency remains appropriate PT Frequency: Min 3X/week Follow Up Recommendations: Skilled nursing facility Equipment Recommended: Defer to next venue PT Goals  Acute Rehab PT Goals PT Goal Formulation: With patient Pt will go Supine/Side to Sit: with supervision;with HOB 0 degrees PT Goal: Supine/Side to Sit - Progress: Progressing toward goal Pt will Transfer Sit to Stand/Stand to Sit: with min assist PT Transfer Goal: Sit to Stand/Stand to Sit - Progress: Progressing toward goal Pt will Ambulate: 16 - 50 feet PT Goal: Ambulate - Progress: Progressing toward goal  PT Treatment Precautions/Restrictions  Precautions Precautions: Fall Precaution Comments: h/o 2 falls in the past week, pt denies other prior falls Required Braces or Orthoses: No Restrictions Weight Bearing Restrictions: No LUE Weight Bearing: Weight bearing as tolerated LLE Weight Bearing: Weight bearing as tolerated Mobility (including Balance) Bed Mobility Bed Mobility: Yes Supine to Sit: 4: Min assist;With rails;HOB elevated (Comment degrees) (40) Supine to Sit Details (indicate cue type and reason): vc for turning trunk on bed to right Sitting - Scoot to Edge of Bed: 4: Min assist Transfers Transfers: Yes Sit to Stand: 4: Min assist;From elevated surface;From bed (mod assist frombed side toilet with arms) Sit to Stand Details (indicate cue type and reason): Vc to push off with armrests Ambulation/Gait Ambulation/Gait: Yes Ambulation/Gait Assistance: 4: Min assist Ambulation Distance (Feet): 20 Feet (times 2) Assistive device: Rolling walker Gait Pattern: Step-through pattern Gait velocity: slow Stairs: No  Posture/Postural  Control Posture/Postural Control: No significant limitations Static Sitting Balance Static Sitting - Level of Assistance: 5: Stand by assistance Exercise    End of Session General Behavior During Session: [facility] for tasks performed Cognition: [facility] for tasks performed  Blanchard Kelch [phone] 04/10/2011, 12:40 PM

## 2011-04-10 NOTE — Progress Notes (Signed)
CSW spoke with pt's daughter to assist with D/C planning . Pt is agreeable with ST SNF placement at [facility]. Bed is available 11/14 if pt is ready for D/C. Will follow to assist with planning.

## 2011-04-10 NOTE — Progress Notes (Signed)
PATIENT ID: [name]  MRN: [id number]  DOB/AGE:  Jul 30, 1940 / 70 y.o.  5 Days Post-Op Procedure(s) (LRB): INTRAMEDULLARY (IM) NAIL FEMORAL (Left)  Subjective: Pain is mild.  No c/o chest pain or SOB.   Doing well, less SOB, up with PT.    Objective: Vital signs in last 24 hours: Temp:  [98.3 F (36.8 C)-99 F (37.2 C)] 99 F (37.2 C) (11/13 0600) Pulse Rate:  [80-86] 83  (11/13 0600) Resp:  [14-16] 16  (11/13 0600) BP: (109-121)/(66-79) 116/79 mmHg (11/13 0600) SpO2:  [91 %-95 %] 91 % (11/13 0600)  Intake/Output from previous day: 11/12 0701 - 11/13 0700 In: 1240 [P.O.:1240] Out: 1951 [Urine:1950; Stool:1] Intake/Output this shift:     Basename 04/10/11 0350 04/09/11 0421 04/08/11 0510  HGB 9.6* 10.1* 10.3*    Basename 04/10/11 0350 04/09/11 0421  WBC 7.1 7.1  RBC 3.08* 3.30*  HCT 28.5* 30.1*  PLT 116* 92*    Basename 04/10/11 0350 04/09/11 0421  NA 137 139  K 4.5 3.1*  CL 99 97  CO2 33* 34*  BUN 26* 20  CREATININE 1.02 0.97  GLUCOSE 115* 102*  CALCIUM 8.9 8.6   No results found for this basename: LABPT:2,INR:2 in the last 72 hours  Physical Exam: Neurovascular intact Sensation intact distally Intact pulses distally Dorsiflexion/Plantar flexion intact Incision: scant drainage  Assessment/Plan: 5 Days Post-Op Procedure(s) (LRB): INTRAMEDULLARY (IM) NAIL FEMORAL (Left)   Up with therapy Discharge to SNF when medically ready Weight Bearing as Tolerated (WBAT)  VTE prophylaxis: asa/plavix F/u 10-14 days post op  Mable Paris 04/10/2011, 10:24 AM

## 2011-04-10 NOTE — Discharge Summary (Signed)
Physician Discharge Summary  Patient ID: [name] MRN: [id number] DOB/AGE: 1940/10/16 70 y.o.  Admit date: 04/04/2011 Discharge date: 04/10/2011  Admission Diagnoses:  1. left hip fracture. 2. History of coronary artery disease, stable. 3. History of diastolic dysfunction, stable. 4. History of peripheral artery disease  Discharge Diagnoses:  Principal Problem:  *Hip fracture Active Problems:  DYSLIPIDEMIA  ANEMIA  Essential hypertension, benign  CAD, NATIVE VESSEL  DIASTOLIC DYSFUNCTION  PVD  DVT  PAD (peripheral artery disease)  Follicular lymphoma  Confusion  Anemia due to blood loss, acute  Thrombocytopenia  Dyspnea   Discharged Condition: fair  Hospital Course:   HPI:  This is a pleasant 70 y/o female who was walking outside, she states she lost her balance and fell. She was not able to get up. She hip her left hip, she did not hit of head or lose consciousness. She reports no chest pain. She had an MI in 2010, s/p stenting. She states she does not get chest pain, no h/o CHF. She does limited ADL's at home. She is able to climb the stairs at home with no chest discomfort (this she seldom does). She ambulates with a cane. Her most recent surgeries are bypass grafts which, she tolerated the procedures well. History obtained from patient who appears reliable. Patient was cleared for surgery and she underwent this procedure on 04/05/11.  #1 dyspnea: Patient became short of breath postoperatively. She became hypoxic. Chest x-ray did not show any abnormalities, so, we proceeded with a CT angiogram. It did not show any PE. There was a suggestion of mild edema versus infection. The patient was given Lasix and was put on Avelox. She has done well with both of these drugs. She is breathing better. She is saturating better better, although she still requires oxygenation. She'll be discharged on oral doses of Lasix. Her need for Lasix should, be reassessed when the patient  goes for followup of her cardiologist, Dr. Sanjuana Kava with [facility].   #2. DVT, prophylaxis. Because patient is on aspirin and Plavix. It was determined by the orthopedic surgeon, that she'll be high risk if she is placed on heparin product. So, only, aspirin, Plavix, is recommended at this time along with TED stockings as well as sequential compressive devices.  #3 anemia: She did drop her hemoglobin to less than 8. She required 2 units of blood. Patient has done well post transfusion and hemoglobin is stable at this time.  #4 encephalopathy: Patient also became acutely confused postoperatively. This especially after she received narcotic drug agents. Once she was not requiring as many pain medications her mental status cleared up.  #5 thrombocytopenia: The etiology for this was not entirely clear. Over the last 2 days the platelet counts have trended upwards.  #6 history of cardiac disease, including the carotid disease and*dysfunction is all stable.  #7 history of peripheral vascular disease is stable as well.  #8 She has a history of lymphoma, which is in remission. She is followed by oncologist at the cancer Center. Dr. Gaylyn Rong.  #9 Initially, patient wanted to go home. However, because of her shortness of breath, requiring treatment with antibiotics and diuretics the patient became more deconditioned. She would benefit from short-term rehabilitation in a skilled nursing facility. Patient is finally agreeable to this plan.  #10 Hypertension: The patient has been having borderline low blood pressures for the last few days. Although she's not very symptomatic with this. So at this time we'll go ahead and discontinue amlodipine.  Will also decrease the dose of her metoprolol. These issues can be readdressed when the patient goes for followup with her cardiologist.  During the course of this hospitalization her daughter Lawson Fiscal has been kept informed on a daily basis. Her phone number is [phone] and her  work phone number is [phone].  It is felt that the patient will be ready for discharge on 04/11/2011. Social worker will be able to arrange a bed for her at [facility] medical care at that time.   PERTINENT LABS  Initial blood work suggested. Normal. Electrolytes. Hemoglobin was 13.9. Hemoglobin subsequently dropped to 7.2, requiring blood transfusions. Platelet count initially was 148. It dropped to 76 and now is up to 116. Potassium level is 4.5 today.   IMAGING STUDIES Dg Chest 2 View  04/05/2011  *RADIOLOGY REPORT*  Clinical Data: Preop  CHEST - 2 VIEW  Comparison: 02/01/2010  Findings: Heart is mildly enlarged.  Lungs are clear.  No pneumothorax.  No pleural fluid. Upper thoracic compression deformities are stable.  Hyperaeration.  IMPRESSION: Cardiomegaly without edema.  Original Report Authenticated By: Donavan Burnet, M.D.   Dg Hip Complete Left  04/04/2011  *RADIOLOGY REPORT*  Clinical Data: Fall  LEFT HIP - COMPLETE 2+ VIEW  Comparison: 11/24/2010  Findings: There is lucency through the intertrochanteric region of the left proximal femur worrisome for fracture.  Osteopenia.  Bony framework is otherwise intact.  Vascular calcifications.  IMPRESSION: Findings are worrisome for a left intertrochanteric femur fracture. Confirmation with MRI or CT is recommended.  Original Report Authenticated By: Donavan Burnet, M.D.   Dg Femur Left  04/05/2011  *RADIOLOGY REPORT*  Clinical Data: The intertrochanteric fracture of the left hip.  LEFT FEMUR - 2 VIEW  Comparison: CT scan of 04/05/2011  Findings:  The patient has undergone insertion of an intramedullary rod and an intertrochanteric nail into the femoral head.  Alignment of the fracture fragments is anatomic.  IMPRESSION: Open reduction and internal fixation of the intertrochanteric fracture of the proximal left femur.  Original Report Authenticated By: Gwynn Burly, M.D.   Ct Angio Chest W/cm &/or Wo Cm  04/08/2011  *RADIOLOGY REPORT*   Clinical Data:  Hypoxia, postop hip surgery, evaluate for PE  CT ANGIOGRAPHY CHEST WITH CONTRAST  Technique:  Multidetector CT imaging of the chest was performed using the standard protocol during bolus administration of intravenous contrast.  Multiplanar CT image reconstructions including MIPs were obtained to evaluate the vascular anatomy.  Contrast: OMNIPAQUE IOHEXOL 300 MG/ML IV SOLN  Comparison:  Chest radiograph dated 04/07/2011.  CT chest dated 11/24/2010.  Findings:  No evidence of pulmonary embolism.  Mild centrilobular emphysema.  Scattered subpleural reticulation/ground-glass opacities, most prominent in the bilateral upper lobes, new from prior CT.  Dependent atelectasis in the bilateral lower lobes.  No pleural effusion or pneumothorax.  Visualized thyroid is unremarkable.  The heart is normal in size.  No pericardial effusion.  Coronary atherosclerosis.  Atherosclerotic calcification of the aortic arch. Mild ectasia of the ascending aorta measuring up to 3.9 cm in transverse dimension.  No suspicious mediastinal, hilar, or axillary lymphadenopathy.  Visualized upper abdomen is notable for a tiny hiatal hernia and cholecystectomy clips.  Mild degenerative changes of the visualized thoracolumbar spine.  Review of the MIP images confirms the above findings.  IMPRESSION: No evidence of pulmonary embolism.  Scattered subpleural reticulation/ground-glass opacities, most prominent in the bilateral upper lobes, new from prior CT.  This appearance is likely infectious, although early interstitial lung  disease such as nonspecific interstitial pneumonitis is also possible.  Mild centrilobular emphysema.  Original Report Authenticated By: Charline Bills, M.D.   Ct Hip Left Wo Contrast  04/05/2011  *RADIOLOGY REPORT*  Clinical Data: Fall  CT OF THE LEFT HIP WITHOUT CONTRAST  Technique:  Multidetector CT imaging was performed according to the standard protocol. Multiplanar CT image reconstructions were  also generated.  Comparison: None.  Findings: Intertrochanteric femur fracture with impaction and minimal displacement is present.  Acetabulum is intact.  Foley catheter decompresses the bladder.  IMPRESSION: Acute left intertrochanteric femur fracture.  Original Report Authenticated By: Donavan Burnet, M.D.   Dg Chest Portable 1 View  04/07/2011  *RADIOLOGY REPORT*  Clinical Data: Shortness of breath, cough  PORTABLE CHEST - 1 VIEW  Comparison: 04/05/2011  Findings: The lungs are essentially clear. No pleural effusion or pneumothorax.  Cardiomediastinal silhouette is within normal limits.  IMPRESSION: No evidence of acute cardiopulmonary disease.  Original Report Authenticated By: Charline Bills, M.D.   Dg C-arm 1-60 Min-no Report  04/05/2011  CLINICAL DATA: trochanteric nail   C-ARM 1-60 MINUTES  Fluoroscopy was utilized by the requesting physician.  No radiographic  interpretation.      Discharge Exam: Blood pressure 92/51, pulse 84, temperature 99 F (37.2 C), temperature source Oral, resp. rate 16, height 5\' 6"  (1.676 m), weight 68.04 kg (150 lb), SpO2 91.00%. General appearance: alert, cooperative, appears stated age and no distress Neck: no adenopathy, no carotid bruit, no JVD, supple, symmetrical, trachea midline and thyroid not enlarged, symmetric, no tenderness/mass/nodules Resp: rales base - right Cardio: regular rate and rhythm, S1, S2 normal, no murmur, click, rub or gallop GI: soft, non-tender; bowel sounds normal; no masses,  no organomegaly Pulses: palpable Skin: Skin color, texture, turgor normal. No rashes or lesions Neurologic: Alert and oriented X 3, normal strength and tone. Normal symmetric reflexes. Normal coordination and gait  Disposition: SNF  Discharge Orders    Future Appointments: Provider: Department: Dept Phone: Center:   04/27/2011 2:30 PM [facility] [phone] [facility]   04/27/2011 3:00 PM [facility] [phone] [facility]    04/27/2011 3:30 PM Nilda Simmer, MD [facility] [phone] [facility]   05/16/2011 3:30 PM Verne Carrow, MD [facility] [phone] [facility]     Current Discharge Medication List    START taking these medications   Details  docusate sodium 100 MG CAPS Take 100 mg by mouth 2 (two) times daily. Qty: 10 capsule    furosemide (LASIX) 40 MG tablet Take 1 tablet (40 mg total) by mouth daily.    HYDROcodone-acetaminophen (NORCO) 5-325 MG per tablet Take 1-2 tablets by mouth every 4 (four) hours as needed. Qty: 30 tablet, Refills: 0    moxifloxacin (AVELOX) 400 MG tablet Take 1 tablet (400 mg total) by mouth daily at 6 PM.    polyethylene glycol (MIRALAX / GLYCOLAX) packet Take 17 g by mouth daily as needed. Qty: 14 each    potassium chloride SA (K-DUR,KLOR-CON) 20 MEQ tablet Take 1 tablet (20 mEq total) by mouth daily.      CONTINUE these medications which have CHANGED   Details  metoprolol (LOPRESSOR) 50 MG tablet Take 0.5 tablets (25 mg total) by mouth 2 (two) times daily. Qty: 60 tablet, Refills: 10      CONTINUE these medications which have NOT CHANGED   Details  acetaminophen (TYLENOL) 500 MG tablet Take 500 mg by mouth every 6 (six) hours as needed. Takes for pain  aspirin 81 MG tablet Take 81 mg by mouth daily.     clopidogrel (PLAVIX) 75 MG tablet Take 1 tablet (75 mg total) by mouth daily. Qty: 30 tablet, Refills: 11    magnesium oxide (MAG-OX) 400 MG tablet Take 400 mg by mouth 2 (two) times daily.     Multiple Vitamins-Minerals (MULTIVITAMIN WITH MINERALS) tablet Take 1 tablet by mouth daily.     rosuvastatin (CRESTOR) 40 MG tablet Take 40 mg by mouth daily.     nitroGLYCERIN (NITROSTAT) 0.4 MG SL tablet Place 0.4 mg under the tongue every 5 (five) minutes as needed.       STOP taking these medications     amLODipine (NORVASC) 5 MG tablet        Follow-up Information    Follow up with MCALHANY,CHRISTOPHER, MD in 3 weeks.   Contact  information:   [facility] [address] [place] [address] [phone]       Follow up with Mable Paris, MD in 10 days.   Contact information:   [facility] [address] [place] [address] [phone]          Signed: Osvaldo Shipper 04/10/2011, 12:27 PM

## 2011-04-10 NOTE — Progress Notes (Signed)
Physical Therapy Treatment Patient Details Name: [name] MRN: [id number] DOB: 13-Nov-1940 Today's Date: 04/10/2011 1447-1500 1G PT Assessment/Plan  PT - Assessment/Plan Comments on Treatment Session: pt tolerated very well PT Plan: Discharge plan remains appropriate;Frequency remains appropriate PT Frequency: Min 3X/week Follow Up Recommendations: Skilled nursing facility Equipment Recommended: Defer to next venue PT Goals  Acute Rehab PT Goals PT Goal Formulation: With patient Time For Goal Achievement: 2 weeks Pt will go Supine/Side to Sit: with supervision;with HOB 0 degrees PT Goal: Supine/Side to Sit - Progress: Progressing toward goal Pt will Transfer Sit to Stand/Stand to Sit: with min assist PT Transfer Goal: Sit to Stand/Stand to Sit - Progress: Progressing toward goal Pt will Ambulate: 51 - 150 feet PT Goal: Ambulate - Progress: Progressing toward goal  PT Treatment Precautions/Restrictions  Precautions Precautions: Fall Precaution Comments: h/o 2 falls in the past week, pt denies other prior falls Required Braces or Orthoses: No Restrictions Weight Bearing Restrictions: No LUE Weight Bearing: Weight bearing as tolerated LLE Weight Bearing: Weight bearing as tolerated Mobility (including Balance) Bed Mobility Bed Mobility: Yes Supine to Sit: 4: Min assist;With rails;HOB elevated (Comment degrees) (40) Sitting - Scoot to Edge of Bed: 4: Min assist Sit to Supine - Left: 4: Min assist;HOB flat (assist forLEs onto bed) Transfers Transfers: Yes Sit to Stand: 4: Min assist;From chair/3-in-1 (mod assist frombed side toilet with arms) Sit to Stand Details (indicate cue type and reason):  to reach back,push off Ambulation/Gait Ambulation/Gait: Yes Ambulation/Gait Assistance: 4: Min assist Ambulation/Gait Assistance Details (indicate cue type and reason): VC for posture Ambulation Distance (Feet): 60 Feet (20 feet first to BR) Assistive device: Rolling  walker Gait Pattern: Step-through pattern Gait velocity: slow Stairs: No  Posture/Postural Control Posture/Postural Control: No significant limitations Static Sitting Balance Static Sitting - Balance Support: Feet supported Exercise    End of Session PT - End of Session Equipment Utilized During Treatment: Gait belt Activity Tolerance: Patient tolerated treatment well Patient left: in bed;with call bell in reach Nurse Communication: Mobility status for transfers General Behavior During Session: [facility] for tasks performed Cognition: [facility] for tasks performed  Rada Hay 04/10/2011, 5:56 PM

## 2011-04-11 LAB — BASIC METABOLIC PANEL
CO2: 31 mEq/L (ref 19–32)
Chloride: 103 mEq/L (ref 96–112)
Potassium: 4.3 mEq/L (ref 3.5–5.1)
Sodium: 140 mEq/L (ref 135–145)

## 2011-04-11 LAB — CBC
Platelets: 147 10*3/uL — ABNORMAL LOW (ref 150–400)
RBC: 3.33 MIL/uL — ABNORMAL LOW (ref 3.87–5.11)
WBC: 7.5 10*3/uL (ref 4.0–10.5)

## 2011-04-11 MED ORDER — HYDROCODONE-ACETAMINOPHEN 5-325 MG PO TABS: 1.0000 | ORAL_TABLET | ORAL | Status: AC | PRN

## 2011-04-11 NOTE — Progress Notes (Signed)
PATIENT ID: PASCHA FOGAL  MRN: [id number]  DOB/AGE:  Dec 18, 1940 / 70 y.o.  6 Days Post-Op Procedure(s) (LRB): INTRAMEDULLARY (IM) NAIL FEMORAL (Left)  Subjective: Pain is mild.  No c/o chest pain or SOB.   Doing well with PT. Ready for D/c to SNF.   Objective: Vital signs in last 24 hours: Temp:  [97.9 F (36.6 C)-99 F (37.2 C)] 99 F (37.2 C) (11/14 0635) Pulse Rate:  [77-96] 88  (11/14 0635) Resp:  [14] 14  (11/14 0635) BP: (92-113)/(51-73) 109/68 mmHg (11/14 0635) SpO2:  [94 %-96 %] 96 % (11/14 0635)  Intake/Output from previous day: 11/13 0701 - 11/14 0700 In: 2015 [P.O.:2015] Out: 201 [Urine:200; Stool:1] Intake/Output this shift:     Basename 04/11/11 0428 04/10/11 0350 04/09/11 0421  HGB 10.0* 9.6* 10.1*    Basename 04/11/11 0428 04/10/11 0350  WBC 7.5 7.1  RBC 3.33* 3.08*  HCT 31.9* 28.5*  PLT 147* 116*    Basename 04/11/11 0428 04/10/11 0350  NA 140 137  K 4.3 4.5  CL 103 99  CO2 31 33*  BUN 31* 26*  CREATININE 1.00 1.02  GLUCOSE 103* 115*  CALCIUM 9.2 8.9   No results found for this basename: LABPT:2,INR:2 in the last 72 hours  Physical Exam: Neurovascular intact Sensation intact distally Intact pulses distally Dorsiflexion/Plantar flexion intact Incision: no drainage  Assessment/Plan: 6 Days Post-Op Procedure(s) (LRB): INTRAMEDULLARY (IM) NAIL FEMORAL (Left)   Up with therapy Weight Bearing as Tolerated (WBAT)  VTE prophylaxis: intermittent pneumatic compression boots D/c to SNF .  F/u with me 10-14 days post op.   Mable Paris 04/11/2011, 7:47 AM

## 2011-04-11 NOTE — Progress Notes (Signed)
PROGRESS NOTE  PEGGYANN ZWIEFELHOFER ZOX:[id number] DOB: 03-28-1941 DOA: 04/04/2011  Subjective: No new issues. Feels well.  Objective: Filed Vitals:   04/11/11 0635  BP: 109/68  Pulse: 88  Temp: 99 F (37.2 C)  Resp: 14   Blood pressure 109/68, pulse 88, temperature 99 F (37.2 C), temperature source Oral, resp. rate 14, height 5\' 6"  (1.676 m), weight 68.04 kg (150 lb), SpO2 96.00%.   Intake/Output Summary (Last 24 hours) at 04/11/11 1117 Last data filed at 04/11/11 0900  Gross per 24 hour  Intake   1080 ml  Output    200 ml  Net    880 ml    Exam: General: Well-developed and well-nourished. No acute distress. Cardiovascular: Regular rate and rhythm. No lower extremity edema. Respiratory: Clear to auscultation bilaterally. No wheezes, Rales or rhonchi. Normal respiratory effort. Psychiatric: Speech fluent and clear.  Assessment/Plan: Left hip fracture: Status post surgery. Plan transferred to skilled nursing facility today for acute rehabilitation. Shortness of breath and hypoxia: Possible pneumonia. Normal respiratory effort in oxygenation without supplementation. Continue Avelox. Anemia: Stable. Status post transfusion. Confusion: Resolved. Thrombocytopenia: Resolved.  Disposition Plan: Transfer to skilled nursing facility today for rehabilitation.    PCP: Verne Carrow, MD, MD   LOS: 7 days   Pandora Leiter MD   Data Reviewed: Results for orders placed during the hospital encounter of 04/04/11 (from the past 24 hour(s))  CBC     Status: Abnormal   Collection Time   04/11/11  4:28 AM      Component Value Range   WBC 7.5  4.0 - 10.5 (K/uL)   RBC 3.33 (*) 3.87 - 5.11 (MIL/uL)   Hemoglobin 10.0 (*) 12.0 - 15.0 (g/dL)   HCT 81.1 (*) 91.4 - 46.0 (%)   MCV 95.8  78.0 - 100.0 (fL)   MCH 30.0  26.0 - 34.0 (pg)   MCHC 31.3  30.0 - 36.0 (g/dL)   RDW 78.2  95.6 - 21.3 (%)   Platelets 147 (*) 150 - 400 (K/uL)  BASIC METABOLIC PANEL     Status: Abnormal     Collection Time   04/11/11  4:28 AM      Component Value Range   Sodium 140  135 - 145 (mEq/L)   Potassium 4.3  3.5 - 5.1 (mEq/L)   Chloride 103  96 - 112 (mEq/L)   CO2 31  19 - 32 (mEq/L)   Glucose, Bld 103 (*) 70 - 99 (mg/dL)   BUN 31 (*) 6 - 23 (mg/dL)   Creatinine, Ser 0.86  0.50 - 1.10 (mg/dL)   Calcium 9.2  8.4 - 57.8 (mg/dL)   GFR calc non Af Amer 56 (*) >90 (mL/min)   GFR calc Af Amer 65 (*) >90 (mL/min)    Recent Results (from the past 240 hour(s))  SURGICAL PCR SCREEN     Status: Normal   Collection Time   04/05/11  9:01 AM      Component Value Range Status Comment   MRSA, PCR NEGATIVE  NEGATIVE  Final    Staphylococcus aureus NEGATIVE  NEGATIVE  Final      Studies/Results: Dg Chest 2 View  04/05/2011  *RADIOLOGY REPORT*  Clinical Data: Preop  CHEST - 2 VIEW  Comparison: 02/01/2010  Findings: Heart is mildly enlarged.  Lungs are clear.  No pneumothorax.  No pleural fluid. Upper thoracic compression deformities are stable.  Hyperaeration.  IMPRESSION: Cardiomegaly without edema.  Original Report Authenticated By: Donavan Burnet, M.D.   Dg  Hip Complete Left  04/04/2011  *RADIOLOGY REPORT*  Clinical Data: Fall  LEFT HIP - COMPLETE 2+ VIEW  Comparison: 11/24/2010  Findings: There is lucency through the intertrochanteric region of the left proximal femur worrisome for fracture.  Osteopenia.  Bony framework is otherwise intact.  Vascular calcifications.  IMPRESSION: Findings are worrisome for a left intertrochanteric femur fracture. Confirmation with MRI or CT is recommended.  Original Report Authenticated By: Donavan Burnet, M.D.   Dg Femur Left  04/05/2011  *RADIOLOGY REPORT*  Clinical Data: The intertrochanteric fracture of the left hip.  LEFT FEMUR - 2 VIEW  Comparison: CT scan of 04/05/2011  Findings:  The patient has undergone insertion of an intramedullary rod and an intertrochanteric nail into the femoral head.  Alignment of the fracture fragments is anatomic.   IMPRESSION: Open reduction and internal fixation of the intertrochanteric fracture of the proximal left femur.  Original Report Authenticated By: Gwynn Burly, M.D.   Ct Angio Chest W/cm &/or Wo Cm  04/08/2011  *RADIOLOGY REPORT*  Clinical Data:  Hypoxia, postop hip surgery, evaluate for PE  CT ANGIOGRAPHY CHEST WITH CONTRAST  Technique:  Multidetector CT imaging of the chest was performed using the standard protocol during bolus administration of intravenous contrast.  Multiplanar CT image reconstructions including MIPs were obtained to evaluate the vascular anatomy.  Contrast: OMNIPAQUE IOHEXOL 300 MG/ML IV SOLN  Comparison:  Chest radiograph dated 04/07/2011.  CT chest dated 11/24/2010.  Findings:  No evidence of pulmonary embolism.  Mild centrilobular emphysema.  Scattered subpleural reticulation/ground-glass opacities, most prominent in the bilateral upper lobes, new from prior CT.  Dependent atelectasis in the bilateral lower lobes.  No pleural effusion or pneumothorax.  Visualized thyroid is unremarkable.  The heart is normal in size.  No pericardial effusion.  Coronary atherosclerosis.  Atherosclerotic calcification of the aortic arch. Mild ectasia of the ascending aorta measuring up to 3.9 cm in transverse dimension.  No suspicious mediastinal, hilar, or axillary lymphadenopathy.  Visualized upper abdomen is notable for a tiny hiatal hernia and cholecystectomy clips.  Mild degenerative changes of the visualized thoracolumbar spine.  Review of the MIP images confirms the above findings.  IMPRESSION: No evidence of pulmonary embolism.  Scattered subpleural reticulation/ground-glass opacities, most prominent in the bilateral upper lobes, new from prior CT.  This appearance is likely infectious, although early interstitial lung disease such as nonspecific interstitial pneumonitis is also possible.  Mild centrilobular emphysema.  Original Report Authenticated By: Charline Bills, M.D.   Ct Hip  Left Wo Contrast  04/05/2011  *RADIOLOGY REPORT*  Clinical Data: Fall  CT OF THE LEFT HIP WITHOUT CONTRAST  Technique:  Multidetector CT imaging was performed according to the standard protocol. Multiplanar CT image reconstructions were also generated.  Comparison: None.  Findings: Intertrochanteric femur fracture with impaction and minimal displacement is present.  Acetabulum is intact.  Foley catheter decompresses the bladder.  IMPRESSION: Acute left intertrochanteric femur fracture.  Original Report Authenticated By: Donavan Burnet, M.D.   Dg Chest Portable 1 View  04/07/2011  *RADIOLOGY REPORT*  Clinical Data: Shortness of breath, cough  PORTABLE CHEST - 1 VIEW  Comparison: 04/05/2011  Findings: The lungs are essentially clear. No pleural effusion or pneumothorax.  Cardiomediastinal silhouette is within normal limits.  IMPRESSION: No evidence of acute cardiopulmonary disease.  Original Report Authenticated By: Charline Bills, M.D.   Dg C-arm 1-60 Min-no Report  04/05/2011  CLINICAL DATA: trochanteric nail   C-ARM 1-60 MINUTES  Fluoroscopy was utilized by  the requesting physician.  No radiographic  interpretation.       Scheduled Meds:   . aspirin  81 mg Oral Daily  . docusate sodium  100 mg Oral BID  . furosemide  40 mg Oral Daily  . magnesium oxide  400 mg Oral BID  . metoprolol  50 mg Oral BID  . moxifloxacin  400 mg Oral q1800  . potassium chloride  20 mEq Oral Daily  . rosuvastatin  40 mg Oral Daily  . DISCONTD: amLODipine  5 mg Oral Daily  . DISCONTD: enoxaparin  40 mg Subcutaneous Q24H  . DISCONTD: potassium chloride  20 mEq Oral BID   Continuous Infusions:

## 2011-04-17 ENCOUNTER — Telehealth: Payer: Self-pay | Admitting: *Deleted

## 2011-04-17 NOTE — Telephone Encounter (Signed)
Rec'd VM x 2 from daughter, Lawson Fiscal, today asking when pt's last Flu shot was.  Called back at home # and pt's grandaughter, Wylene Men, answered. Lawson Fiscal was not available.  Informed Wylene Men that our records indicate we last gave pt flu shot here on 06/02/10.   Instructed her to have Lawson Fiscal call back again if any further questions. She verbalized understanding.

## 2011-04-25 ENCOUNTER — Other Ambulatory Visit: Payer: Self-pay | Admitting: Cardiovascular Disease

## 2011-04-26 ENCOUNTER — Encounter: Payer: Self-pay | Admitting: Vascular Surgery

## 2011-04-27 ENCOUNTER — Ambulatory Visit: Payer: Medicare Other | Admitting: Vascular Surgery

## 2011-04-27 ENCOUNTER — Encounter: Payer: Medicare Other | Admitting: *Deleted

## 2011-04-27 ENCOUNTER — Encounter (INDEPENDENT_AMBULATORY_CARE_PROVIDER_SITE_OTHER): Payer: Medicare Other | Admitting: *Deleted

## 2011-04-27 DIAGNOSIS — Z48812 Encounter for surgical aftercare following surgery on the circulatory system: Secondary | ICD-10-CM

## 2011-04-27 DIAGNOSIS — I739 Peripheral vascular disease, unspecified: Secondary | ICD-10-CM

## 2011-05-16 ENCOUNTER — Ambulatory Visit: Payer: Medicare Other | Admitting: Cardiovascular Disease

## 2011-05-16 ENCOUNTER — Other Ambulatory Visit: Payer: Self-pay | Admitting: Cardiovascular Disease

## 2011-06-01 ENCOUNTER — Encounter: Payer: Self-pay | Admitting: *Deleted

## 2011-06-08 ENCOUNTER — Ambulatory Visit (HOSPITAL_BASED_OUTPATIENT_CLINIC_OR_DEPARTMENT_OTHER): Payer: Medicare Other

## 2011-06-08 ENCOUNTER — Telehealth: Payer: Self-pay | Admitting: Oncology

## 2011-06-08 ENCOUNTER — Other Ambulatory Visit (HOSPITAL_BASED_OUTPATIENT_CLINIC_OR_DEPARTMENT_OTHER): Payer: Medicare Other | Admitting: Lab

## 2011-06-08 ENCOUNTER — Ambulatory Visit (HOSPITAL_BASED_OUTPATIENT_CLINIC_OR_DEPARTMENT_OTHER): Payer: Medicare Other | Admitting: Oncology

## 2011-06-08 VITALS — BP 150/73 | HR 64 | Temp 97.2°F | Ht 66.0 in | Wt 142.3 lb

## 2011-06-08 VITALS — BP 134/77 | HR 58 | Temp 97.2°F

## 2011-06-08 DIAGNOSIS — C8299 Follicular lymphoma, unspecified, extranodal and solid organ sites: Secondary | ICD-10-CM

## 2011-06-08 DIAGNOSIS — Z23 Encounter for immunization: Secondary | ICD-10-CM

## 2011-06-08 DIAGNOSIS — S7292XA Unspecified fracture of left femur, initial encounter for closed fracture: Secondary | ICD-10-CM

## 2011-06-08 DIAGNOSIS — Z5112 Encounter for antineoplastic immunotherapy: Secondary | ICD-10-CM

## 2011-06-08 DIAGNOSIS — C8290 Follicular lymphoma, unspecified, unspecified site: Secondary | ICD-10-CM

## 2011-06-08 DIAGNOSIS — Z1239 Encounter for other screening for malignant neoplasm of breast: Secondary | ICD-10-CM

## 2011-06-08 DIAGNOSIS — I10 Essential (primary) hypertension: Secondary | ICD-10-CM

## 2011-06-08 DIAGNOSIS — Z1231 Encounter for screening mammogram for malignant neoplasm of breast: Secondary | ICD-10-CM

## 2011-06-08 DIAGNOSIS — Z8679 Personal history of other diseases of the circulatory system: Secondary | ICD-10-CM

## 2011-06-08 LAB — COMPREHENSIVE METABOLIC PANEL
AST: 28 U/L (ref 0–37)
Alkaline Phosphatase: 115 U/L (ref 39–117)
Glucose, Bld: 115 mg/dL — ABNORMAL HIGH (ref 70–99)
Sodium: 143 mEq/L (ref 135–145)
Total Bilirubin: 0.3 mg/dL (ref 0.3–1.2)
Total Protein: 6.2 g/dL (ref 6.0–8.3)

## 2011-06-08 LAB — CBC WITH DIFFERENTIAL/PLATELET
EOS%: 1.9 % (ref 0.0–7.0)
Eosinophils Absolute: 0.1 10*3/uL (ref 0.0–0.5)
LYMPH%: 13.3 % — ABNORMAL LOW (ref 14.0–49.7)
MCH: 31.6 pg (ref 25.1–34.0)
MCHC: 33.7 g/dL (ref 31.5–36.0)
MCV: 94 fL (ref 79.5–101.0)
MONO%: 6.1 % (ref 0.0–14.0)
NEUT#: 5.9 10*3/uL (ref 1.5–6.5)
Platelets: 151 10*3/uL (ref 145–400)
RBC: 4.17 10*6/uL (ref 3.70–5.45)

## 2011-06-08 MED ORDER — INFLUENZA VIRUS VACC SPLIT PF IM SUSP
0.5000 mL | Freq: Once | INTRAMUSCULAR | Status: AC
  Administered 2011-06-08: 0.5 mL via INTRAMUSCULAR
  Filled 2011-06-08: qty 0.5

## 2011-06-08 MED ORDER — SODIUM CHLORIDE 0.9 % IV SOLN
600.0000 mg | Freq: Once | INTRAVENOUS | Status: AC
  Administered 2011-06-08: 600 mg via INTRAVENOUS
  Filled 2011-06-08: qty 60

## 2011-06-08 MED ORDER — SODIUM CHLORIDE 0.9 % IV SOLN: Freq: Once | INTRAVENOUS | Status: DC

## 2011-06-08 MED ORDER — DIPHENHYDRAMINE HCL 25 MG PO CAPS
50.0000 mg | ORAL_CAPSULE | Freq: Once | ORAL | Status: AC
  Administered 2011-06-08: 50 mg via ORAL

## 2011-06-08 MED ORDER — ACETAMINOPHEN 325 MG PO TABS
650.0000 mg | ORAL_TABLET | Freq: Once | ORAL | Status: AC
  Administered 2011-06-08: 650 mg via ORAL

## 2011-06-08 NOTE — Progress Notes (Signed)
[facility] OFFICE PROGRESS NOTE  Cc:  MCALHANY,CHRISTOPHER, MD, MD  DIAGNOSIS:   History of stage III symptomatic non-Hodgkin's lymphoma; centroblastic variant follicular lymphoma; CD20 positive, CD79a positive, CD10 and CD23 faintly positive.  Follicular international prognostic index score of 4, which was high risk.  PAST THERAPY:  s/p 6 cycles of CHOP-Rituxan (first cycle was without Rituxan).  Last cycle given on 10/21/09.    CURRENT THERAPY:  maintenance Rituxan q 2 months x 2 years started on 12/02/09  INTERVAL HISTORY: [name] 71 y.o. female returns for regular follow up with her daughter. She was admitted to the hospital in November of last year for left femur fracture when she sustained a fall. She fell because she tripped when walking on cement platform without any loss of consciousness.  She was sent to nursing home for about 3 or 4 weeks and has recovered well. She is able to ambulate using a walker and is able to bear weight in the left hip with only minor pain. With respect to her history of lymphoma, she is doing well.  She cannot palpate any node swelling. She denies anorexia, weight loss, headache, night sweats, chest pain, shortness of breath, bleeding symptom, skin rash, low back pain, low extremity paresthesia, skin rash, fever, depression, feeling hopelessness, palpable breast mass or abnormality.  MEDICAL HISTORY: Past Medical History  Diagnosis Date  . Atherosclerosis of native arteries of the extremities with ulceration   . Lymphoma, non Hodgkin's   . Hypertension   . CAD (coronary artery disease)   . Myocardial infarction   . DVT (deep venous thrombosis)   . Follicular lymphoma dx'd 05/2009    chemo R-CHOP comp 09/2009; maintenance  Rituxan from 11/2009 to 09/2011  . Neuromuscular disorder 02/2009    SURGICAL HISTORY:  Past Surgical History  Procedure Date  . Coronary angioplasty with stent placement   . Femoral-popliteal bypass graft  02/15/10    Left CFA to above knee popliteal BPG by Dr. Leonides Sake  . Toe amputation 03/13/10    Left Great toe by Dr. Leonides Sake  . Angioplasty / stenting femoral 08/28/10    Left distal anastomosis, angioplasty w/stent by Dr. Leonides Sake  . Appendectomy   . Tonsillectomy   . Cholecystectomy     Gall bladder  . Femur im nail 04/05/2011    Procedure: INTRAMEDULLARY (IM) NAIL FEMORAL;  Surgeon: Mable Paris, MD;  Location: [facility];  Service: Orthopedics;  Laterality: Left;  Left hip trochanteric nail    MEDICATIONS: Current Outpatient Prescriptions  Medication Sig Dispense Refill  . amLODipine (NORVASC) 5 MG tablet Take 5 mg by mouth daily.      Marland Kitchen aspirin 81 MG tablet Take 81 mg by mouth daily.       . clopidogrel (PLAVIX) 75 MG tablet Take 1 tablet (75 mg total) by mouth daily.  30 tablet  11  . CRESTOR 40 MG tablet TAKE 1 TABLET BY MOUTH EVERY DAY  30 tablet  6  . HYDROcodone-acetaminophen (NORCO) 5-325 MG per tablet Take 1 tablet by mouth 2 (two) times daily as needed.      . magnesium oxide (MAG-OX) 400 MG tablet TAKE 1 TABLET TWICE A DAY  60 tablet  10  . metoprolol (LOPRESSOR) 50 MG tablet Take 0.5 tablets (25 mg total) by mouth 2 (two) times daily.  60 tablet  10  . Multiple Vitamins-Minerals (MULTIVITAMIN WITH MINERALS) tablet Take 1 tablet by mouth daily.       Marland Kitchen  nitroGLYCERIN (NITROSTAT) 0.4 MG SL tablet Place 0.4 mg under the tongue every 5 (five) minutes as needed.       Marland Kitchen acetaminophen (TYLENOL) 500 MG tablet Take 500 mg by mouth every 6 (six) hours as needed. Takes for pain       Current Facility-Administered Medications  Medication Dose Route Frequency Provider Last Rate Last Dose  . influenza  inactive virus vaccine (FLUZONE/FLUARIX) injection 0.5 mL  0.5 mL Intramuscular Once Jethro Bolus, MD       Facility-Administered Medications Ordered in Other Visits  Medication Dose Route Frequency Provider Last Rate Last Dose  . 0.9 %  sodium chloride infusion   Intravenous  Once Jethro Bolus, MD      . acetaminophen (TYLENOL) tablet 650 mg  650 mg Oral Once Jethro Bolus, MD   650 mg at 06/08/11 1022  . diphenhydrAMINE (BENADRYL) capsule 50 mg  50 mg Oral Once Jethro Bolus, MD   50 mg at 06/08/11 1022  . riTUXimab (RITUXAN) 600 mg in sodium chloride 0.9 % 250 mL chemo infusion  600 mg Intravenous Once Jethro Bolus, MD 112 mL/hr at 06/08/11 1122 600 mg at 06/08/11 1122    ALLERGIES:   has no known allergies.  REVIEW OF SYSTEMS:  The rest of the 14-point review of system was negative.   Filed Vitals:   06/08/11 0921  BP: 150/73  Pulse: 64  Temp: 97.2 F (36.2 C)   Wt Readings from Last 3 Encounters:  06/08/11 142 lb 4.8 oz (64.547 kg)  02/02/11 144 lb 9.6 oz (65.59 kg)  04/05/11 150 lb (68.04 kg)   ECOG Performance status: 1  PHYSICAL EXAMINATION:   General:  Thin-appearing woman in no acute distress.  Eyes:  no scleral icterus.  ENT:  There were no oropharyngeal lesions.  Neck was without thyromegaly.  Lymphatics:  Negative cervical, supraclavicular or axillary adenopathy.  Respiratory: lungs were clear bilaterally without wheezing or crackles.  Cardiovascular:  Regular rate and rhythm, S1/S2, without murmur, rub or gallop.  There was no pedal edema.  GI:  abdomen was soft, flat, nontender, nondistended, without organomegaly.  Muscoloskeletal:  no spinal tenderness of palpation of vertebral spine.  Skin exam was without echymosis, petichae.  Neuro exam was nonfocal.  Patient was able to get on and off exam table without assistance.  Gait was weak on the left due to recent fracture.  Patient was alerted and oriented.  Attention was good.   Language was appropriate.  Mood was normal without depression.  Speech was not pressured.  Thought content was not tangential.         LABORATORY/RADIOLOGY DATA:  Lab Results  Component Value Date   WBC 7.6 06/08/2011   HGB 13.2 06/08/2011   HCT 39.1 06/08/2011   PLT 151 06/08/2011   GLUCOSE 103* 04/11/2011   CHOL  Value: 131         ATP III CLASSIFICATION:  <200     mg/dL   Desirable  161-096  mg/dL   Borderline High  >=045    mg/dL   High        40/98/1191   TRIG 93 03/21/2009   HDL 35* 03/21/2009   LDLCALC  Value: 77        Total Cholesterol/HDL:CHD Risk Coronary Heart Disease Risk Table                     Men   Women  1/2 Average Risk   3.4  3.3  Average Risk       5.0   4.4  2 X Average Risk   9.6   7.1  3 X Average Risk  23.4   11.0        Use the calculated Patient Ratio above and the CHD Risk Table to determine the patient's CHD Risk.        ATP III CLASSIFICATION (LDL):  <100     mg/dL   Optimal  161-096  mg/dL   Near or Above                    Optimal  130-159  mg/dL   Borderline  045-409  mg/dL   High  >811     mg/dL   Very High 91/47/8295   ALT 19 02/02/2011   AST 29 02/02/2011   NA 140 04/11/2011   K 4.3 04/11/2011   CL 103 04/11/2011   CREATININE 1.00 04/11/2011   BUN 31* 04/11/2011   CO2 31 04/11/2011   TSH 1.562 05/30/2009   INR 0.88 04/04/2011   HGBA1C  Value: 5.7 (NOTE) The ADA recommends the following therapeutic goal for glycemic control related to Hgb A1c measurement: Goal of therapy: <6.5 Hgb A1c  Reference: American Diabetes Association: Clinical Practice Recommendations 2010, Diabetes Care, 2010, 33: (Suppl  1). 03/20/2009    ASSESSMENT AND PLAN:   1. History of lymphoma:  She has no evidence of disease recurrence on clinical history, physical exam, laboratory tests and CT scan images.  I advised her to continue with maintenance Rituxan once every 2 months at this time.  She has no side effect of Rituxan to require dose modification.  Given the recent CT and showed the chest I would defer restaging with CT chest/abd/pel until prior to visit with me in March 2013. 2. Primary care:  She still has not established care with a primary care physician despite multiple urgings from me.  She said that for now she is only  following with her cardiologist and vascular surgeon.  I referred her to [facility] for breast  mammogram and DEXA bone density given the recent fracture to rule out osteoporosis.   3. Hypertension:   She is on metoprolol and amlodipine.  4. Hx of arterial insufficiency s/p stent and bypass:  she is on aspirin, plavix, rosuvastatin, beta blocker.  5. Follow up with me in 2 months.   The length of time of the face-to-face encounter was 25 minutes. More than 50% of time was spent counseling and coordination of care.

## 2011-06-08 NOTE — Telephone Encounter (Signed)
Sch pt for mammo and bone density for 06/22/11 at 12;45am,order faxed,lab and ct on 3/1 and md visit and tx on 3/8,Daughter put all info in her calendar   aom

## 2011-06-22 ENCOUNTER — Ambulatory Visit: Payer: Medicare Other | Admitting: Cardiovascular Disease

## 2011-06-28 ENCOUNTER — Other Ambulatory Visit: Payer: Self-pay | Admitting: Cardiovascular Disease

## 2011-07-02 NOTE — Progress Notes (Signed)
Received mammogram results from Dr. Jeralyn Ruths @ [facility]; forwarded to Dr. Gaylyn Rong.

## 2011-07-06 ENCOUNTER — Telehealth: Payer: Self-pay | Admitting: Oncology

## 2011-07-06 NOTE — Telephone Encounter (Signed)
called pts home to r/s appt on 03/08 and they asked to come in on 03/22

## 2011-07-20 ENCOUNTER — Encounter: Payer: Self-pay | Admitting: Cardiovascular Disease

## 2011-07-20 ENCOUNTER — Ambulatory Visit (INDEPENDENT_AMBULATORY_CARE_PROVIDER_SITE_OTHER): Payer: Medicare Other | Admitting: Cardiovascular Disease

## 2011-07-20 VITALS — BP 130/70 | HR 68 | Resp 18 | Ht 65.0 in | Wt 144.4 lb

## 2011-07-20 DIAGNOSIS — I2510 Atherosclerotic heart disease of native coronary artery without angina pectoris: Secondary | ICD-10-CM

## 2011-07-20 NOTE — Progress Notes (Signed)
History of Present Illness: 71 yo WF with history of Non-Hodgkins lymphoma, tobacco abuse, HTN, hyperlipidemia, PAD and CAD here today for follow up. She was admitted to [facility] with inferior STEMI 03/20/09 at which time she was found to have a total occlusion of the proximal RCA at the area of a prior stent. A drug eluting stent was deployed in the proximal RCA. Her LV gram and echo showed basal to mid inferior and posterior wall hypokinesis with overall preserved EF. During admission she complained of bilateral lower ext pain with ambulation and weakness with ambulation. We ordered lower extremity arterial dopplers that showed total occlusion of both SFA at the origin with ABI of 0.60 bilaterally. There was also high grade right common femoral artery stenosis. She was noted to have uncontrolled HTN and unequal blood pressures in her arms. A CT angiogram of the chest/neck/abdomen was ordered. This showed stenosis of the left subclavian artery, mildly dilated aortic root, mild disease in bilateral renal arteries. There is diffuse vascular disease including the mesenteric vessels. Unfortunately, the CT scan also showed a right paraspinal soft tissue mass as well as diffuse retroperitoneal and mesenteric adenopathy. She was then diagnosed with Stage III Non-Hodgkins lymphoma and is undergoing treatment under the guidance of Dr. Jethro Bolus. She has unfortunately also developed a DVT and is on coumadin therapy. In the Fall of 2011, she hit her left leg and developed an ulcer over the anterior aspect of the left leg, just above the ankle. She was unable to heal this area. Dr. Imogene Burn performed left common femoral artery to left popliteal artery bypass on 02/15/10. She then had her left great toe ampuated on 03/14/10 by Dr. Imogene Burn. In April she had placement of a stent in the distal portion of the bypass at the anastamosis with the popliteal artery per Dr. Imogene Burn. Dr. Imogene Burn is following her vascular disease. Recent non-invasive  studies May 2012 with patent bypass graft in left leg in [facility] office. She is finishing up her chemotherapy. Her NHL is felt to be in remission. She fell in November and broke her left hip. Dr. Ave Filter put a pin in her left hip. Echo 9/11 with normal LV function and mild aortic stenosis.   She is here today for cardiac follow up. She has been feeling great. She has had no chest pain, SOB, palpitations, near syncope or syncope. She has been taking all of her medications.    Last Lipid Profile: Needs updating. She wishes to schedule at next appt.   Past Medical History  Diagnosis Date  . Atherosclerosis of native arteries of the extremities with ulceration   . Lymphoma, non Hodgkin's   . Hypertension   . CAD (coronary artery disease)   . Myocardial infarction   . DVT (deep venous thrombosis)   . Follicular lymphoma dx'd 05/2009    chemo R-CHOP comp 09/2009; maintenance  Rituxan from 11/2009 to 09/2011  . Neuromuscular disorder 02/2009    Past Surgical History  Procedure Date  . Coronary angioplasty with stent placement   . Femoral-popliteal bypass graft 02/15/10    Left CFA to above knee popliteal BPG by Dr. Leonides Sake  . Toe amputation 03/13/10    Left Great toe by Dr. Leonides Sake  . Angioplasty / stenting femoral 08/28/10    Left distal anastomosis, angioplasty w/stent by Dr. Leonides Sake  . Appendectomy   . Tonsillectomy   . Cholecystectomy     Gall bladder  . Femur im nail 04/05/2011  Procedure: INTRAMEDULLARY (IM) NAIL FEMORAL;  Surgeon: Mable Paris, MD;  Location: [facility];  Service: Orthopedics;  Laterality: Left;  Left hip trochanteric nail    Current Outpatient Prescriptions  Medication Sig Dispense Refill  . acetaminophen (TYLENOL) 500 MG tablet Take 500 mg by mouth every 6 (six) hours as needed. Takes for pain      . amLODipine (NORVASC) 5 MG tablet Take 5 mg by mouth daily.      Marland Kitchen aspirin 81 MG tablet Take 81 mg by mouth daily.       . clopidogrel (PLAVIX) 75 MG  tablet Take 1 tablet (75 mg total) by mouth daily.  30 tablet  11  . CRESTOR 40 MG tablet TAKE 1 TABLET BY MOUTH EVERY DAY  30 tablet  6  . magnesium oxide (MAG-OX) 400 MG tablet TAKE 1 TABLET TWICE A DAY  60 tablet  10  . metoprolol (LOPRESSOR) 50 MG tablet Take 0.5 tablets (25 mg total) by mouth 2 (two) times daily.  60 tablet  10  . Multiple Vitamins-Minerals (MULTIVITAMIN WITH MINERALS) tablet Take 1 tablet by mouth daily.       . nitroGLYCERIN (NITROSTAT) 0.4 MG SL tablet Place 0.4 mg under the tongue every 5 (five) minutes as needed.         No Known Allergies  History   Social History  . Marital Status: Divorced    Spouse Name: N/A    Number of Children: N/A  . Years of Education: N/A   Occupational History  . Not on file.   Social History Main Topics  . Smoking status: Former Smoker    Types: Cigarettes    Quit date: 02/25/2009  . Smokeless tobacco: Not on file  . Alcohol Use: No  . Drug Use: Yes  . Sexually Active: Not on file   Other Topics Concern  . Not on file   Social History Narrative  . No narrative on file    Family History  Problem Relation Age of Onset  . Heart attack Father     Review of Systems:  As stated in the HPI and otherwise negative.   BP 130/70  Pulse 68  Resp 18  Ht 5\' 5"  (1.651 m)  Wt 144 lb 6.4 oz (65.499 kg)  BMI 24.03 kg/m2  Physical Examination: General: Well developed, well nourished, NAD HEENT: OP clear, mucus membranes moist SKIN: warm, dry. No rashes. Neuro: No focal deficits Musculoskeletal: Muscle strength 5/5 all ext Psychiatric: Mood and affect normal Neck: No JVD, no carotid bruits, no thyromegaly, no lymphadenopathy. Lungs:Clear bilaterally, no wheezes, rhonci, crackles Cardiovascular: Regular rate and rhythm. Slight systolic murmur. No gallops or rubs. Abdomen:Soft. Bowel sounds present. Non-tender.  Extremities: No lower extremity edema. Pulses are non-palpable in the bilateral DP/PT.

## 2011-07-20 NOTE — Patient Instructions (Signed)
Your physician wants you to follow-up in: 6 months. You will receive a reminder letter in the mail two months in advance. If you don't receive a letter, please call our office to schedule the follow-up appointment.  Your physician recommends that you return for fasting lab work in 6 months on day of appt with Dr. McAlhany  

## 2011-07-20 NOTE — Assessment & Plan Note (Addendum)
Stable. No changes. Continue ASA, Plavix, statin, beta blocker. Will check lipids and LFTs at f/u visit in 6 months.

## 2011-07-27 ENCOUNTER — Other Ambulatory Visit (HOSPITAL_BASED_OUTPATIENT_CLINIC_OR_DEPARTMENT_OTHER): Payer: Medicare Other | Admitting: Lab

## 2011-07-27 ENCOUNTER — Other Ambulatory Visit (HOSPITAL_COMMUNITY): Payer: Medicare Other

## 2011-07-27 ENCOUNTER — Ambulatory Visit (HOSPITAL_COMMUNITY)
Admission: RE | Admit: 2011-07-27 | Discharge: 2011-07-27 | Disposition: A | Discharge status: Home / Self Care | Payer: Medicare Other | Source: Ambulatory Visit | Attending: Oncology | Admitting: Oncology

## 2011-07-27 ENCOUNTER — Other Ambulatory Visit: Payer: Medicare Other

## 2011-07-27 DIAGNOSIS — Z1239 Encounter for other screening for malignant neoplasm of breast: Secondary | ICD-10-CM

## 2011-07-27 DIAGNOSIS — I700 Atherosclerosis of aorta: Secondary | ICD-10-CM | POA: Insufficient documentation

## 2011-07-27 DIAGNOSIS — C8589 Other specified types of non-Hodgkin lymphoma, extranodal and solid organ sites: Secondary | ICD-10-CM | POA: Insufficient documentation

## 2011-07-27 DIAGNOSIS — Z9089 Acquired absence of other organs: Secondary | ICD-10-CM | POA: Insufficient documentation

## 2011-07-27 DIAGNOSIS — M899 Disorder of bone, unspecified: Secondary | ICD-10-CM | POA: Insufficient documentation

## 2011-07-27 DIAGNOSIS — C8290 Follicular lymphoma, unspecified, unspecified site: Secondary | ICD-10-CM

## 2011-07-27 DIAGNOSIS — J984 Other disorders of lung: Secondary | ICD-10-CM | POA: Insufficient documentation

## 2011-07-27 DIAGNOSIS — S7292XA Unspecified fracture of left femur, initial encounter for closed fracture: Secondary | ICD-10-CM

## 2011-07-27 DIAGNOSIS — I2510 Atherosclerotic heart disease of native coronary artery without angina pectoris: Secondary | ICD-10-CM | POA: Insufficient documentation

## 2011-07-27 DIAGNOSIS — Z79899 Other long term (current) drug therapy: Secondary | ICD-10-CM | POA: Insufficient documentation

## 2011-07-27 DIAGNOSIS — I77819 Aortic ectasia, unspecified site: Secondary | ICD-10-CM | POA: Insufficient documentation

## 2011-07-27 DIAGNOSIS — Z9221 Personal history of antineoplastic chemotherapy: Secondary | ICD-10-CM | POA: Insufficient documentation

## 2011-07-27 DIAGNOSIS — C8299 Follicular lymphoma, unspecified, extranodal and solid organ sites: Secondary | ICD-10-CM

## 2011-07-27 LAB — CMP (CANCER CENTER ONLY)
BUN, Bld: 16 mg/dL (ref 7–22)
CO2: 31 mEq/L (ref 18–33)
Calcium: 9.7 mg/dL (ref 8.0–10.3)
Chloride: 98 mEq/L (ref 98–108)
Creat: 0.9 mg/dl (ref 0.6–1.2)
Glucose, Bld: 96 mg/dL (ref 73–118)

## 2011-07-27 LAB — CBC WITH DIFFERENTIAL/PLATELET
Eosinophils Absolute: 0.1 10*3/uL (ref 0.0–0.5)
HCT: 42.8 % (ref 34.8–46.6)
LYMPH%: 20 % (ref 14.0–49.7)
MONO#: 0.5 10*3/uL (ref 0.1–0.9)
NEUT#: 4.9 10*3/uL (ref 1.5–6.5)
NEUT%: 71.3 % (ref 38.4–76.8)
Platelets: 173 10*3/uL (ref 145–400)
WBC: 6.8 10*3/uL (ref 3.9–10.3)

## 2011-07-27 MED ORDER — IOHEXOL 300 MG/ML  SOLN
100.0000 mL | Freq: Once | INTRAMUSCULAR | Status: AC | PRN
  Administered 2011-07-27: 100 mL via INTRAVENOUS

## 2011-08-03 ENCOUNTER — Ambulatory Visit: Payer: Medicare Other | Admitting: Oncology

## 2011-08-03 ENCOUNTER — Ambulatory Visit (HOSPITAL_BASED_OUTPATIENT_CLINIC_OR_DEPARTMENT_OTHER): Payer: Medicare Other

## 2011-08-03 ENCOUNTER — Other Ambulatory Visit: Payer: Medicare Other | Admitting: Lab

## 2011-08-03 VITALS — BP 147/75 | HR 54 | Temp 97.7°F

## 2011-08-03 DIAGNOSIS — C8299 Follicular lymphoma, unspecified, extranodal and solid organ sites: Secondary | ICD-10-CM

## 2011-08-03 DIAGNOSIS — C8290 Follicular lymphoma, unspecified, unspecified site: Secondary | ICD-10-CM

## 2011-08-03 DIAGNOSIS — Z5112 Encounter for antineoplastic immunotherapy: Secondary | ICD-10-CM

## 2011-08-03 MED ORDER — DIPHENHYDRAMINE HCL 25 MG PO CAPS
50.0000 mg | ORAL_CAPSULE | Freq: Once | ORAL | Status: AC
  Administered 2011-08-03: 50 mg via ORAL

## 2011-08-03 MED ORDER — RITUXIMAB CHEMO INJECTION 10 MG/ML
600.0000 mg | Freq: Once | INTRAVENOUS | Status: AC
  Administered 2011-08-03: 600 mg via INTRAVENOUS
  Filled 2011-08-03: qty 60

## 2011-08-03 MED ORDER — ACETAMINOPHEN 325 MG PO TABS
650.0000 mg | ORAL_TABLET | Freq: Once | ORAL | Status: AC
  Administered 2011-08-03: 650 mg via ORAL

## 2011-08-03 MED ORDER — SODIUM CHLORIDE 0.9 % IV SOLN
Freq: Once | INTRAVENOUS | Status: AC
  Administered 2011-08-03: 10:00:00 via INTRAVENOUS

## 2011-08-03 NOTE — Patient Instructions (Signed)
Pt aware of future appts, d/c'd with daughter with walker.  SLJ

## 2011-08-09 ENCOUNTER — Encounter: Payer: Self-pay | Admitting: Vascular Surgery

## 2011-08-09 ENCOUNTER — Ambulatory Visit: Payer: Medicare Other | Admitting: Oncology

## 2011-08-10 ENCOUNTER — Ambulatory Visit: Payer: Medicare Other | Admitting: Oncology

## 2011-08-10 ENCOUNTER — Ambulatory Visit: Payer: Medicare Other | Admitting: Vascular Surgery

## 2011-08-17 ENCOUNTER — Encounter: Payer: Self-pay | Admitting: Oncology

## 2011-08-17 ENCOUNTER — Telehealth: Payer: Self-pay | Admitting: Oncology

## 2011-08-17 ENCOUNTER — Ambulatory Visit (HOSPITAL_BASED_OUTPATIENT_CLINIC_OR_DEPARTMENT_OTHER): Payer: Medicare Other | Admitting: Oncology

## 2011-08-17 VITALS — BP 140/79 | HR 63 | Temp 96.7°F | Ht 65.0 in | Wt 147.6 lb

## 2011-08-17 DIAGNOSIS — C8296 Follicular lymphoma, unspecified, intrapelvic lymph nodes: Secondary | ICD-10-CM

## 2011-08-17 DIAGNOSIS — I10 Essential (primary) hypertension: Secondary | ICD-10-CM

## 2011-08-17 DIAGNOSIS — C8290 Follicular lymphoma, unspecified, unspecified site: Secondary | ICD-10-CM

## 2011-08-17 DIAGNOSIS — M810 Age-related osteoporosis without current pathological fracture: Secondary | ICD-10-CM

## 2011-08-17 MED ORDER — ALENDRONATE SODIUM 70 MG PO TABS: 70.0000 mg | ORAL_TABLET | ORAL | Status: DC

## 2011-08-17 NOTE — Telephone Encounter (Signed)
Gv pt appt for [id number]

## 2011-08-17 NOTE — Progress Notes (Signed)
[facility] OFFICE PROGRESS NOTE  Cc:  MCALHANY,CHRISTOPHER, MD, MD  DIAGNOSIS:   History of stage III symptomatic non-Hodgkin's lymphoma; centroblastic variant follicular lymphoma; CD20 positive, CD79a positive, CD10 and CD23 faintly positive.  Follicular international prognostic index score of 4, which was high risk.  PAST THERAPY:  s/p 6 cycles of CHOP-Rituxan (first cycle was without Rituxan).  Last cycle given on 10/21/09.    CURRENT THERAPY:  maintenance Rituxan q 2 months x 2 years started on 12/02/09  INTERVAL HISTORY: [name] 71 y.o. female returns for regular follow up with her daughter. She continues to do well. She cannot palpate any node swelling. She denies anorexia, weight loss, headache, night sweats, chest pain, shortness of breath, bleeding symptom, skin rash, low back pain, low extremity paresthesia, skin rash, fever, depression, feeling hopelessness, palpable breast mass or abnormality. Appetite is good and weight is up by 3 lbs since her last visit.   MEDICAL HISTORY: Past Medical History  Diagnosis Date  . Atherosclerosis of native arteries of the extremities with ulceration   . Lymphoma, non Hodgkin's   . Hypertension   . CAD (coronary artery disease)   . Myocardial infarction   . DVT (deep venous thrombosis)   . Follicular lymphoma dx'd 05/2009    chemo R-CHOP comp 09/2009; maintenance  Rituxan from 11/2009 to 09/2011  . Neuromuscular disorder 02/2009  . Osteoporosis 08/17/2011    SURGICAL HISTORY:  Past Surgical History  Procedure Date  . Coronary angioplasty with stent placement   . Femoral-popliteal bypass graft 02/15/10    Left CFA to above knee popliteal BPG by Dr. Leonides Sake  . Toe amputation 03/13/10    Left Great toe by Dr. Leonides Sake  . Angioplasty / stenting femoral 08/28/10    Left distal anastomosis, angioplasty w/stent by Dr. Leonides Sake  . Appendectomy   . Tonsillectomy   . Cholecystectomy     Gall bladder  . Femur im  nail 04/05/2011    Procedure: INTRAMEDULLARY (IM) NAIL FEMORAL;  Surgeon: Mable Paris, MD;  Location: [facility];  Service: Orthopedics;  Laterality: Left;  Left hip trochanteric nail    MEDICATIONS: Current Outpatient Prescriptions  Medication Sig Dispense Refill  . acetaminophen (TYLENOL) 500 MG tablet Take 500 mg by mouth every 6 (six) hours as needed. Takes for pain      . amLODipine (NORVASC) 5 MG tablet Take 5 mg by mouth daily.      Marland Kitchen aspirin 81 MG tablet Take 81 mg by mouth daily.       . clopidogrel (PLAVIX) 75 MG tablet Take 1 tablet (75 mg total) by mouth daily.  30 tablet  11  . CRESTOR 40 MG tablet TAKE 1 TABLET BY MOUTH EVERY DAY  30 tablet  6  . magnesium oxide (MAG-OX) 400 MG tablet TAKE 1 TABLET TWICE A DAY  60 tablet  10  . metoprolol (LOPRESSOR) 50 MG tablet Take 0.5 tablets (25 mg total) by mouth 2 (two) times daily.  60 tablet  10  . Multiple Vitamins-Minerals (MULTIVITAMIN WITH MINERALS) tablet Take 1 tablet by mouth daily.       . nitroGLYCERIN (NITROSTAT) 0.4 MG SL tablet Place 0.4 mg under the tongue every 5 (five) minutes as needed.       Marland Kitchen alendronate (FOSAMAX) 70 MG tablet Take 1 tablet (70 mg total) by mouth every 7 (seven) days. Take with a full glass of water on an empty stomach.  4 tablet  11    ALLERGIES:   has no known allergies.  REVIEW OF SYSTEMS:  The rest of the 14-point review of system was negative.   Filed Vitals:   08/17/11 1417  BP: 140/79  Pulse: 63  Temp: 96.7 F (35.9 C)   Wt Readings from Last 3 Encounters:  08/17/11 147 lb 9.6 oz (66.951 kg)  07/20/11 144 lb 6.4 oz (65.499 kg)  06/08/11 142 lb 4.8 oz (64.547 kg)   ECOG Performance status: 1  PHYSICAL EXAMINATION:   General:  Thin-appearing woman in no acute distress.  Eyes:  no scleral icterus.  ENT:  There were no oropharyngeal lesions.  Neck was without thyromegaly.  Lymphatics:  Negative cervical, supraclavicular or axillary adenopathy.  Respiratory: lungs were clear  bilaterally without wheezing or crackles.  Cardiovascular:  Regular rate and rhythm, S1/S2, without murmur, rub or gallop.  There was no pedal edema.  GI:  abdomen was soft, flat, nontender, nondistended, without organomegaly.  Muscoloskeletal:  no spinal tenderness of palpation of vertebral spine.  Skin exam was without echymosis, petichae.  Neuro exam was nonfocal.  Patient was able to get on and off exam table without assistance.  Gait was weak on the left due to recent fracture.  Patient was alerted and oriented.  Attention was good.   Language was appropriate.  Mood was normal without depression.  Speech was not pressured.  Thought content was not tangential.     LABORATORY/RADIOLOGY DATA:  Lab Results  Component Value Date   WBC 6.8 07/27/2011   HGB 14.1 07/27/2011   HCT 42.8 07/27/2011   PLT 173 07/27/2011   GLUCOSE 96 07/27/2011   CHOL  Value: 131        ATP III CLASSIFICATION:  <200     mg/dL   Desirable  161-096  mg/dL   Borderline High  >=045    mg/dL   High        40/98/1191   TRIG 93 03/21/2009   HDL 35* 03/21/2009   LDLCALC  Value: 77        Total Cholesterol/HDL:CHD Risk Coronary Heart Disease Risk Table                     Men   Women  1/2 Average Risk   3.4   3.3  Average Risk       5.0   4.4  2 X Average Risk   9.6   7.1  3 X Average Risk  23.4   11.0        Use the calculated Patient Ratio above and the CHD Risk Table to determine the patient's CHD Risk.        ATP III CLASSIFICATION (LDL):  <100     mg/dL   Optimal  478-295  mg/dL   Near or Above                    Optimal  130-159  mg/dL   Borderline  621-308  mg/dL   High  >657     mg/dL   Very High 84/69/6295   ALT 21 06/08/2011   AST 31 07/27/2011   NA 144 07/27/2011   K 4.0 07/27/2011   CL 98 07/27/2011   CREATININE 0.9 07/27/2011   BUN 16 07/27/2011   CO2 31 07/27/2011   TSH 1.562 05/30/2009   INR 0.88 04/04/2011   HGBA1C  Value: 5.7 (NOTE) The ADA recommends the following therapeutic goal for glycemic control  related to Hgb A1c measurement:  Goal of therapy: <6.5 Hgb A1c  Reference: American Diabetes Association: Clinical Practice Recommendations 2010, Diabetes Care, 2010, 33: (Suppl  1). 03/20/2009   RADIOLOGY:  CT CHEST, ABDOMEN AND PELVIS WITH CONTRAST  Technique: Multidetector CT imaging of the chest, abdomen and  pelvis was performed following the standard protocol during bolus  administration of intravenous contrast.  Contrast: OMNIPAQUE IOHEXOL 300 MG/ML IJ SOLN  Comparison: CT chest dated 04/08/2011. CT chest/abdomen/pelvis  dated 11/24/2010.  CT CHEST  Findings: Mild scattered residual ground-glass opacities /  scarring in the left upper lobe (series 5/images 8-10). This  appearance is similar to 11/24/2010. The additional superimposed  upper lobe predominant subpleural opacities have resolved, likely  infectious/inflammatory.  Mild underlying centrilobular emphysematous changes. No suspicious  pulmonary nodules. No pleural effusion or pneumothorax.  Visualized thyroid is unremarkable.  Heart is normal in size. No pericardial effusion. Coronary  atherosclerosis. Ectasia of the ascending aorta, measuring up to  3.9 cm, unchanged. Atherosclerotic calcifications of the aortic  arch.  No suspicious mediastinal, hilar, or axillary lymphadenopathy.  Mild degenerative changes of the visualized thoracolumbar spine.  IMPRESSION:  No evidence of lymphomatous involvement in the chest.  Mild scattered ground-glass opacities in the left upper lobe,  similar to 11/24/2010.  Additional superimposed upper lobe predominant subpleural opacities  have resolved, likely infectious/inflammatory.  CT ABDOMEN AND PELVIS  Findings: Liver, spleen, pancreas, and adrenal glands are within  normal limits.  Status post cholecystectomy. No intrahepatic ductal dilatation.  Stable common bile duct.  Scattered tiny cysts with areas of cortical scarring. No  hydronephrosis.  No evidence of bowel obstruction.  Atherosclerotic  calcifications of the abdominal aorta and branch  vessels.  Stable mild residual retroperitoneal/para-aortic soft tissue  (series 2/image 70). Stable mild soft tissue involving the root of  the jejunal mesentery (series 2/image 78). These findings are  compatible with treated lymphoma. No new/suspicious abdominopelvic  lymphadenopathy.  Uterus and bilateral ovaries are unremarkable.  Bladder is within normal limits.  Mild degenerative changes of the lumbar spine. Stable benign  sclerotic lesions in the pelvis. Status post ORIF of the left hip.  IMPRESSION:  Stable findings of treated lymphoma in the retroperitoneum and  jejunal mesentery, as described above.  No evidence of lymphomatous recurrence in the abdomen/pelvis.  Original Report Authenticated By: Charline Bills, M.D.   ASSESSMENT AND PLAN:   1. History of lymphoma:  She has no evidence of disease recurrence on clinical history, physical exam, laboratory tests and CT scan images.  She will continue with maintenance Rituxan once every 2 months at this time.  She has no side effect of Rituxan to require dose modification. Next Rituxan is scheduled for May 2013. 2. Primary care:  She still has not established care with a primary care physician despite multiple recommendations to do so.  She said that for now she is only  following with her cardiologist and vascular surgeon. Mammogram performed in February 2013 and was normal. Pap smear and Colonoscopy have been recommended, but she has refused. 3. Osteoporosis. She has a history of a hip fracture and has osteoporosis on DEXA scan from February 2013. Recommended Oscal 1 tab twice a day and prescription for Fosamax 70 mg weekly given. 4. Hypertension:   She is on metoprolol and amlodipine.  5. Hx of arterial insufficiency s/p stent and bypass:  she is on aspirin, plavix, rosuvastatin, beta blocker.  6. Follow up. She will follow-up with Dr Gaylyn Rong in 2  months with labs and Rituxan and will be  seem by me in July with labs and Rituxan. Plan for restaging CT scans in September 2013 then yearly thereafter.  The patient was seen and examined by Dr Gaylyn Rong. >90% of plan of care developed by Dr Gaylyn Rong. The length of time of the face-to-face encounter was 30 minutes. More than 50% of time was spent counseling and coordination of care.

## 2011-09-20 ENCOUNTER — Encounter: Payer: Self-pay | Admitting: Vascular Surgery

## 2011-09-21 ENCOUNTER — Ambulatory Visit (INDEPENDENT_AMBULATORY_CARE_PROVIDER_SITE_OTHER): Payer: Medicare Other | Admitting: Neurosurgery

## 2011-09-21 ENCOUNTER — Ambulatory Visit (INDEPENDENT_AMBULATORY_CARE_PROVIDER_SITE_OTHER): Payer: Medicare Other | Admitting: *Deleted

## 2011-09-21 ENCOUNTER — Encounter: Payer: Self-pay | Admitting: Neurosurgery

## 2011-09-21 ENCOUNTER — Encounter (INDEPENDENT_AMBULATORY_CARE_PROVIDER_SITE_OTHER): Payer: Medicare Other | Admitting: *Deleted

## 2011-09-21 VITALS — BP 183/85 | HR 64 | Resp 16 | Ht 65.0 in | Wt 150.9 lb

## 2011-09-21 DIAGNOSIS — I70309 Unspecified atherosclerosis of unspecified type of bypass graft(s) of the extremities, unspecified extremity: Secondary | ICD-10-CM

## 2011-09-21 DIAGNOSIS — I7092 Chronic total occlusion of artery of the extremities: Secondary | ICD-10-CM | POA: Insufficient documentation

## 2011-09-21 DIAGNOSIS — Z48812 Encounter for surgical aftercare following surgery on the circulatory system: Secondary | ICD-10-CM

## 2011-09-21 NOTE — Progress Notes (Signed)
[facility] HISTORY AND PHYSICAL   CC: [name] followed for lower arterial ABIs and bypass graft evaluation. Referring Physician: Imogene Burn  History of Present Illness: 71 year old patient of Dr. Imogene Burn is followed for serial ABIs and lower extremity bypass graft scan s/p.left common femoral artery to above knee  popliteal artery bypass and reverse ipsilateral greater saphenous vein with subsequent distal angioplasty and stenting in April 2012. The patient reports no claudication type symptoms or rest pain. She did however have an ORIF of her left femur a few months ago due to a fracture. She states she thinks most of her pain is coming from that.   Past Medical History  Diagnosis Date  . Atherosclerosis of native arteries of the extremities with ulceration   . Lymphoma, non Hodgkin's   . Hypertension   . CAD (coronary artery disease)   . Myocardial infarction   . DVT (deep venous thrombosis)   . Follicular lymphoma dx'd 05/2009    chemo R-CHOP comp 09/2009; maintenance  Rituxan from 11/2009 to 09/2011  . Neuromuscular disorder 02/2009  . Osteoporosis 08/17/2011    ROS: [x]  Positive   [ ]  Denies    General: [ ]  Weight loss, [ ]  Fever, [ ]  chills Neurologic: [ ]  Dizziness, [ ]  Blackouts, [ ]  Seizure [ ]  Stroke, [ ]  "Mini stroke", [ ]  Slurred speech, [ ]  Temporary blindness; [ ]  weakness in arms or legs, [ ]  Hoarseness Cardiac: [ ]  Chest pain/pressure, [ ]  Shortness of breath at rest [ ]  Shortness of breath with exertion, [ ]  Atrial fibrillation or irregular heartbeat Vascular: [ ]  Pain in legs with walking, [ ]  Pain in legs at rest, [ ]  Pain in legs at night,  [ ]  Non-healing ulcer, [ ]  Blood clot in vein/DVT,   Pulmonary: [ ]  Home oxygen, [ ]  Productive cough, [ ]  Coughing up blood, [ ]  Asthma,  [ ]  Wheezing Musculoskeletal:  [ ]  Arthritis, [ ]  Low back pain, [ ]  Joint pain Hematologic: [ ]  Easy Bruising, [ ]  Anemia; [ ]   Hepatitis Gastrointestinal: [ ]  Blood in stool, [ ]  Gastroesophageal Reflux/heartburn, [ ]  Trouble swallowing Urinary: [ ]  chronic Kidney disease, [ ]  on HD - [ ]  MWF or [ ]  TTHS, [ ]  Burning with urination, [ ]  Difficulty urinating Skin: [ ]  Rashes, [ ]  Wounds Psychological: [ ]  Anxiety, [ ]  Depression   Social History History  Substance Use Topics  . Smoking status: Former Smoker    Types: Cigarettes    Quit date: 02/25/2009  . Smokeless tobacco: Not on file  . Alcohol Use: No    Family History Family History  Problem Relation Age of Onset  . Heart attack Father     No Known Allergies  Current Outpatient Prescriptions  Medication Sig Dispense Refill  . acetaminophen (TYLENOL) 500 MG tablet Take 500 mg by mouth every 6 (six) hours as needed. Takes for pain      . alendronate (FOSAMAX) 70 MG tablet Take 1 tablet (70 mg total) by mouth every 7 (seven) days. Take with a full glass of water on an empty stomach.  4 tablet  11  . amLODipine (NORVASC) 5 MG tablet Take 5 mg by mouth daily.      Marland Kitchen aspirin 81 MG tablet Take 81 mg by mouth daily.       . clopidogrel (PLAVIX) 75 MG tablet Take 1 tablet (75 mg total) by mouth daily.  30 tablet  11  . CRESTOR 40 MG tablet TAKE 1 TABLET BY MOUTH EVERY DAY  30 tablet  6  . magnesium oxide (MAG-OX) 400 MG tablet TAKE 1 TABLET TWICE A DAY  60 tablet  10  . metoprolol (LOPRESSOR) 50 MG tablet Take 0.5 tablets (25 mg total) by mouth 2 (two) times daily.  60 tablet  10  . Multiple Vitamins-Minerals (MULTIVITAMIN WITH MINERALS) tablet Take 1 tablet by mouth daily.       . nitroGLYCERIN (NITROSTAT) 0.4 MG SL tablet Place 0.4 mg under the tongue every 5 (five) minutes as needed.         Physical Examination  There were no vitals filed for this visit.  There is no height or weight on file to calculate BMI.  General:  WDWN in NAD Gait: Normal HEENT: WNL Eyes: Pupils equal Pulmonary: normal non-labored breathing , without Rales, rhonchi,   wheezing Cardiac: RRR, without  Murmurs, rubs or gallops; No carotid bruits Abdomen: soft, NT, no masses Skin: no rashes, ulcers noted Vascular Exam/Pulses: She has palpable PT and DP pulses bilaterally  Extremities without ischemic changes, no Gangrene , no cellulitis; no open wounds;  Musculoskeletal: no muscle wasting or atrophy  Neurologic: A&O X 3; Appropriate Affect ; SENSATION: normal; MOTOR FUNCTION:  moving all extremities equally. Speech is fluent/normal  Non-Invasive Vascular Imaging: ABIs today were 0.67 on the right and 1.04 on the left which is slightly improved from May of 2012 there is a very minimal increase in flow velocity below the graft. I did go over this with Dr. Imogene Burn and he is fine with the duplex graft scan as it stands today.  ASSESSMENT/PLAN: Assessment as above, the patient return in 6 months for repeat lower extremity duplex of her graft and ABIs she is in agreement with this her questions were encouraged and answered.  Lauree Chandler ANP next  Clinic M.D.: Imogene Burn

## 2011-09-26 NOTE — Procedures (Unsigned)
BYPASS GRAFT EVALUATION  INDICATION:  Followup peripheral vascular disease  HISTORY: Diabetes:  No Cardiac:  MI in 2010 Hypertension:  Yes Smoking:  Previously Previous Surgery:  Left femoral to popliteal above knee bypass graft 02/15/2010, left great toe amputation, left PTA with stent 08/28/2010  SINGLE LEVEL ARTERIAL EXAM                              RIGHT              LEFT Brachial: Anterior tibial: Posterior tibial: Peroneal: Ankle/brachial index:        0.67               1.04  PREVIOUS ABI:  Date:  10/20/2010  RIGHT:  0.60  LEFT:  1.03  LOWER EXTREMITY BYPASS GRAFT DUPLEX EXAM:  DUPLEX:  Dampened biphasic waveforms throughout the bypass graft with patent proximal and distal anastomosis as well as bypass graft.  Native arteries distal to the anastomosis in thigh not well visualized.  IMPRESSION: 1.  Patent Left femoral popliteal bypass graft. 2. Stable ankle brachial indices, see attached sheet.  ___________________________________________ [name], MD  SS/MEDQ  D:  09/21/2011  T:  09/21/2011  Job:  [id number]

## 2011-09-27 ENCOUNTER — Other Ambulatory Visit: Payer: Self-pay | Admitting: Cardiovascular Disease

## 2011-09-27 NOTE — Telephone Encounter (Signed)
..   Requested Prescriptions   Pending Prescriptions Disp Refills  . amLODipine (NORVASC) 5 MG tablet [Pharmacy Med Name: AMLODIPINE BESYLATE 5 MG TAB] 30 tablet 6    Sig: TAKE 1 TABLET BY MOUTH EVERY DAY   

## 2011-09-27 NOTE — Patient Instructions (Signed)
1.  History of follicular lymphoma: - No evidence of disease recurrence. - Continue to maintenance Rituxan, one last dose in June 2013.   2.  Next CT in September 2013.

## 2011-09-28 ENCOUNTER — Ambulatory Visit (HOSPITAL_BASED_OUTPATIENT_CLINIC_OR_DEPARTMENT_OTHER): Payer: Medicare Other | Admitting: Oncology

## 2011-09-28 ENCOUNTER — Telehealth: Payer: Self-pay | Admitting: *Deleted

## 2011-09-28 ENCOUNTER — Telehealth: Payer: Self-pay | Admitting: Oncology

## 2011-09-28 ENCOUNTER — Other Ambulatory Visit (HOSPITAL_BASED_OUTPATIENT_CLINIC_OR_DEPARTMENT_OTHER): Payer: Medicare Other

## 2011-09-28 ENCOUNTER — Ambulatory Visit (HOSPITAL_BASED_OUTPATIENT_CLINIC_OR_DEPARTMENT_OTHER): Payer: Medicare Other

## 2011-09-28 VITALS — BP 112/75 | HR 61 | Temp 97.9°F

## 2011-09-28 VITALS — BP 159/83 | HR 63 | Temp 97.0°F | Ht 65.0 in | Wt 153.2 lb

## 2011-09-28 DIAGNOSIS — C8299 Follicular lymphoma, unspecified, extranodal and solid organ sites: Secondary | ICD-10-CM

## 2011-09-28 DIAGNOSIS — C8290 Follicular lymphoma, unspecified, unspecified site: Secondary | ICD-10-CM

## 2011-09-28 DIAGNOSIS — I10 Essential (primary) hypertension: Secondary | ICD-10-CM

## 2011-09-28 DIAGNOSIS — Z5112 Encounter for antineoplastic immunotherapy: Secondary | ICD-10-CM

## 2011-09-28 LAB — COMPREHENSIVE METABOLIC PANEL
Albumin: 4.4 g/dL (ref 3.5–5.2)
Alkaline Phosphatase: 122 U/L — ABNORMAL HIGH (ref 39–117)
BUN: 21 mg/dL (ref 6–23)
CO2: 28 mEq/L (ref 19–32)
Glucose, Bld: 105 mg/dL — ABNORMAL HIGH (ref 70–99)
Potassium: 4.1 mEq/L (ref 3.5–5.3)
Sodium: 142 mEq/L (ref 135–145)
Total Protein: 6.7 g/dL (ref 6.0–8.3)

## 2011-09-28 LAB — CBC WITH DIFFERENTIAL/PLATELET
Basophils Absolute: 0 10*3/uL (ref 0.0–0.1)
Eosinophils Absolute: 0.2 10*3/uL (ref 0.0–0.5)
HGB: 13.6 g/dL (ref 11.6–15.9)
LYMPH%: 20.7 % (ref 14.0–49.7)
MCV: 93.9 fL (ref 79.5–101.0)
MONO#: 0.7 10*3/uL (ref 0.1–0.9)
MONO%: 10.1 % (ref 0.0–14.0)
NEUT#: 4.4 10*3/uL (ref 1.5–6.5)
Platelets: 164 10*3/uL (ref 145–400)
RBC: 4.4 10*6/uL (ref 3.70–5.45)
RDW: 14.1 % (ref 11.2–14.5)
WBC: 6.7 10*3/uL (ref 3.9–10.3)

## 2011-09-28 LAB — LACTATE DEHYDROGENASE: LDH: 186 U/L (ref 94–250)

## 2011-09-28 MED ORDER — ACETAMINOPHEN 325 MG PO TABS
650.0000 mg | ORAL_TABLET | Freq: Once | ORAL | Status: AC
  Administered 2011-09-28: 650 mg via ORAL

## 2011-09-28 MED ORDER — SODIUM CHLORIDE 0.9 % IV SOLN
600.0000 mg | Freq: Once | INTRAVENOUS | Status: AC
  Administered 2011-09-28: 600 mg via INTRAVENOUS
  Filled 2011-09-28: qty 60

## 2011-09-28 MED ORDER — DIPHENHYDRAMINE HCL 25 MG PO CAPS
50.0000 mg | ORAL_CAPSULE | Freq: Once | ORAL | Status: AC
  Administered 2011-09-28: 50 mg via ORAL

## 2011-09-28 NOTE — Progress Notes (Signed)
[facility]  Telephone:(336) [phone] Fax:(336) [phone]   OFFICE PROGRESS NOTE   Cc:  MCALHANY,CHRISTOPHER, MD, MD  DIAGNOSIS: History of stage III symptomatic non-Hodgkin's lymphoma; centroblastic variant follicular lymphoma; CD20 positive, CD79a positive, CD10 and CD23 faintly positive. Follicular international prognostic index score of 4, which was high risk.   PAST THERAPY: s/p 6 cycles of CHOP-Rituxan (first cycle was without Rituxan). Last cycle given on 10/21/09.   CURRENT THERAPY: maintenance Rituxan q 2 months x 2 years started on 12/02/09   INTERVAL HISTORY: [name] 71 y.o. female returns for regular follow up with her daughter.  She reports feeling well.  She has good appetite and is gaining weight.  She denies abdominal pain.  She denies palpable node swelling.  She has chronic back and hip pain since fracture last year.  However, she is still ambulatory and is independent of activities of daily living.  Patient denies fatigue, headache, visual changes, confusion, drenching night sweats, palpable lymph node swelling, mucositis, odynophagia, dysphagia, nausea vomiting, jaundice, chest pain, palpitation, shortness of breath, dyspnea on exertion, productive cough, gum bleeding, epistaxis, hematemesis, hemoptysis, abdominal pain, abdominal swelling, early satiety, melena, hematochezia, hematuria, skin rash, spontaneous bleeding, joint swelling, joint pain, heat or cold intolerance, bowel bladder incontinence, back pain, focal motor weakness, paresthesia, depression, suicidal or homocidal ideation, feeling hopelessness.   Past Medical History  Diagnosis Date  . Atherosclerosis of native arteries of the extremities with ulceration   . Lymphoma, non Hodgkin's   . Hypertension   . CAD (coronary artery disease)   . Myocardial infarction   . DVT (deep venous thrombosis)   . Neuromuscular disorder 02/2009  . Osteoporosis 08/17/2011  . Follicular lymphoma dx'd  05/2009    chemo R-CHOP comp 09/2009; maintenance  Rituxan from 11/2009 to 09/2011  . Hodgkin's disease 05/2009    Non-Hodgkin's Disease-Lymphoma    Past Surgical History  Procedure Date  . Coronary angioplasty with stent placement   . Femoral-popliteal bypass graft 02/15/10    Left CFA to above knee popliteal BPG by Dr. Leonides Sake  . Toe amputation 03/13/10    Left Great toe by Dr. Leonides Sake  . Angioplasty / stenting femoral 08/28/10    Left distal anastomosis, angioplasty w/stent by Dr. Leonides Sake  . Appendectomy   . Tonsillectomy   . Cholecystectomy     Gall bladder  . Femur im nail 04/05/2011    Procedure: INTRAMEDULLARY (IM) NAIL FEMORAL;  Surgeon: Mable Paris, MD;  Location: [facility];  Service: Orthopedics;  Laterality: Left;  Left hip trochanteric nail    Current Outpatient Prescriptions  Medication Sig Dispense Refill  . acetaminophen (TYLENOL) 500 MG tablet Take 500 mg by mouth every 6 (six) hours as needed. Takes for pain      . alendronate (FOSAMAX) 70 MG tablet Take 1 tablet (70 mg total) by mouth every 7 (seven) days. Take with a full glass of water on an empty stomach.  4 tablet  11  . amLODipine (NORVASC) 5 MG tablet TAKE 1 TABLET BY MOUTH EVERY DAY  30 tablet  6  . aspirin 81 MG tablet Take 81 mg by mouth daily.       . calcium-vitamin D 250-100 MG-UNIT per tablet Take 1 tablet by mouth 2 (two) times daily.      . clopidogrel (PLAVIX) 75 MG tablet Take 1 tablet (75 mg total) by mouth daily.  30 tablet  11  . CRESTOR 40 MG tablet TAKE  1 TABLET BY MOUTH EVERY DAY  30 tablet  6  . magnesium oxide (MAG-OX) 400 MG tablet TAKE 1 TABLET TWICE A DAY  60 tablet  10  . metoprolol (LOPRESSOR) 50 MG tablet Take 0.5 tablets (25 mg total) by mouth 2 (two) times daily.  60 tablet  10  . Multiple Vitamins-Minerals (MULTIVITAMIN WITH MINERALS) tablet Take 1 tablet by mouth daily.       . nitroGLYCERIN (NITROSTAT) 0.4 MG SL tablet Place 0.4 mg under the tongue every 5 (five)  minutes as needed.       Marland Kitchen DISCONTD: amLODipine (NORVASC) 5 MG tablet Take 5 mg by mouth daily.       No current facility-administered medications for this visit.   Facility-Administered Medications Ordered in Other Visits  Medication Dose Route Frequency Provider Last Rate Last Dose  . acetaminophen (TYLENOL) tablet 650 mg  650 mg Oral Once Exie Parody, MD   650 mg at 09/28/11 0956  . diphenhydrAMINE (BENADRYL) capsule 50 mg  50 mg Oral Once Exie Parody, MD   50 mg at 09/28/11 0956  . riTUXimab (RITUXAN) 600 mg in sodium chloride 0.9 % 250 mL chemo infusion  600 mg Intravenous Once Exie Parody, MD   600 mg at 09/28/11 1016    ALLERGIES:   has no known allergies.  REVIEW OF SYSTEMS:  The rest of the 14-point review of system was negative.   Filed Vitals:   09/28/11 0844  BP: 159/83  Pulse: 63  Temp: 97 F (36.1 C)   Wt Readings from Last 3 Encounters:  09/28/11 153 lb 3.2 oz (69.491 kg)  09/21/11 150 lb 14.4 oz (68.448 kg)  08/17/11 147 lb 9.6 oz (66.951 kg)   ECOG Performance status: 1  PHYSICAL EXAMINATION:   General:  well-nourished woman, in no acute distress.  Eyes:  no scleral icterus.  ENT:  There were no oropharyngeal lesions.  Neck was without thyromegaly.  Lymphatics:  Negative cervical, supraclavicular or axillary adenopathy.  Respiratory: lungs were clear bilaterally without wheezing or crackles.  Cardiovascular:  Regular rate and rhythm, S1/S2, without murmur, rub or gallop.  There was no pedal edema.  GI:  abdomen was soft, flat, nontender, nondistended, without organomegaly.  Muscoloskeletal:  no spinal tenderness of palpation of vertebral spine.  Skin exam was without echymosis, petichae.  Neuro exam was nonfocal.  Patient needed some assistance to get on and off exam table due to her stiff back.  Gait was normal.  Patient was alerted and oriented.  Attention was good.   Language was appropriate.  Mood was normal without depression.  Speech was not pressured.  Thought  content was not tangential.      LABORATORY/RADIOLOGY DATA:  Lab Results  Component Value Date   WBC 6.7 09/28/2011   HGB 13.6 09/28/2011   HCT 41.3 09/28/2011   PLT 164 09/28/2011   GLUCOSE 96 07/27/2011   CHOL  Value: 131        ATP III CLASSIFICATION:  <200     mg/dL   Desirable  478-295  mg/dL   Borderline High  >=621    mg/dL   High        30/86/5784   TRIG 93 03/21/2009   HDL 35* 03/21/2009   LDLCALC  Value: 77        Total Cholesterol/HDL:CHD Risk Coronary Heart Disease Risk Table  Men   Women  1/2 Average Risk   3.4   3.3  Average Risk       5.0   4.4  2 X Average Risk   9.6   7.1  3 X Average Risk  23.4   11.0        Use the calculated Patient Ratio above and the CHD Risk Table to determine the patient's CHD Risk.        ATP III CLASSIFICATION (LDL):  <100     mg/dL   Optimal  161-096  mg/dL   Near or Above                    Optimal  130-159  mg/dL   Borderline  045-409  mg/dL   High  >811     mg/dL   Very High 91/47/8295   ALKPHOS 127* 07/27/2011   ALT 21 06/08/2011   AST 31 07/27/2011   NA 144 07/27/2011   K 4.0 07/27/2011   CL 98 07/27/2011   CREATININE 0.9 07/27/2011   BUN 16 07/27/2011   CO2 31 07/27/2011   INR 0.88 04/04/2011   HGBA1C  Value: 5.7 (NOTE) The ADA recommends the following therapeutic goal for glycemic control related to Hgb A1c measurement: Goal of therapy: <6.5 Hgb A1c  Reference: American Diabetes Association: Clinical Practice Recommendations 2010, Diabetes Care, 2010, 33: (Suppl  1). 03/20/2009      ASSESSMENT AND PLAN:   1. History of lymphoma: She has no evidence of disease recurrence on clinical history, physical exam, laboratory tests. I advised her to continue with maintenance Rituxan once every 2 months at this time. She has no side effect of Rituxan.  She and her daughter expressed informed understanding and wished to proceed with the penultimate dose of maintenance Rituxan today.  2. Hypertension: She is on metoprolol and amlodipine.  Her systolic  blood pressure is slightly elevated.  I defer to her cardiologist to adjust this as appropriate.  3. Hx of arterial insufficiency s/p stent and bypass: she is on aspirin, plavix, rosuvastatin, beta blocker.  4. Follow up with [place] in 2 months for the last dose of maintenance Rituxan.

## 2011-09-28 NOTE — Telephone Encounter (Signed)
appt made and pt place in calendar,aware that mw will move chemo to follow    aom

## 2011-09-28 NOTE — Telephone Encounter (Signed)
Per staff message from [place], I have added treatment appts for the patient. Anne aware.   JMW

## 2011-11-22 ENCOUNTER — Other Ambulatory Visit: Payer: Self-pay | Admitting: Cardiovascular Disease

## 2011-11-23 ENCOUNTER — Encounter: Payer: Self-pay | Admitting: Oncology

## 2011-11-23 ENCOUNTER — Ambulatory Visit (HOSPITAL_BASED_OUTPATIENT_CLINIC_OR_DEPARTMENT_OTHER): Payer: Medicare Other | Admitting: Oncology

## 2011-11-23 ENCOUNTER — Other Ambulatory Visit (HOSPITAL_BASED_OUTPATIENT_CLINIC_OR_DEPARTMENT_OTHER): Payer: Medicare Other | Admitting: Lab

## 2011-11-23 ENCOUNTER — Ambulatory Visit (HOSPITAL_BASED_OUTPATIENT_CLINIC_OR_DEPARTMENT_OTHER): Payer: Medicare Other

## 2011-11-23 VITALS — BP 150/78 | HR 64 | Temp 97.0°F | Ht 65.0 in | Wt 156.1 lb

## 2011-11-23 VITALS — BP 130/85 | HR 60 | Temp 97.6°F

## 2011-11-23 DIAGNOSIS — C8290 Follicular lymphoma, unspecified, unspecified site: Secondary | ICD-10-CM

## 2011-11-23 DIAGNOSIS — C8299 Follicular lymphoma, unspecified, extranodal and solid organ sites: Secondary | ICD-10-CM

## 2011-11-23 DIAGNOSIS — I10 Essential (primary) hypertension: Secondary | ICD-10-CM

## 2011-11-23 DIAGNOSIS — Z5112 Encounter for antineoplastic immunotherapy: Secondary | ICD-10-CM

## 2011-11-23 LAB — COMPREHENSIVE METABOLIC PANEL
ALT: 20 U/L (ref 0–35)
AST: 25 U/L (ref 0–37)
CO2: 26 mEq/L (ref 19–32)
Creatinine, Ser: 0.95 mg/dL (ref 0.50–1.10)
Sodium: 143 mEq/L (ref 135–145)
Total Bilirubin: 0.3 mg/dL (ref 0.3–1.2)
Total Protein: 6.9 g/dL (ref 6.0–8.3)

## 2011-11-23 LAB — CBC WITH DIFFERENTIAL/PLATELET
BASO%: 1 % (ref 0.0–2.0)
Eosinophils Absolute: 0.2 10*3/uL (ref 0.0–0.5)
LYMPH%: 18.1 % (ref 14.0–49.7)
MCHC: 33.7 g/dL (ref 31.5–36.0)
MCV: 91.7 fL (ref 79.5–101.0)
MONO#: 0.8 10*3/uL (ref 0.1–0.9)
MONO%: 9.8 % (ref 0.0–14.0)
NEUT#: 5.4 10*3/uL (ref 1.5–6.5)
RBC: 4.47 10*6/uL (ref 3.70–5.45)
RDW: 13.3 % (ref 11.2–14.5)
WBC: 7.8 10*3/uL (ref 3.9–10.3)
nRBC: 0 % (ref 0–0)

## 2011-11-23 LAB — LACTATE DEHYDROGENASE: LDH: 192 U/L (ref 94–250)

## 2011-11-23 MED ORDER — DIPHENHYDRAMINE HCL 25 MG PO CAPS
50.0000 mg | ORAL_CAPSULE | Freq: Once | ORAL | Status: AC
  Administered 2011-11-23: 50 mg via ORAL

## 2011-11-23 MED ORDER — ACETAMINOPHEN 325 MG PO TABS
650.0000 mg | ORAL_TABLET | Freq: Once | ORAL | Status: AC
  Administered 2011-11-23: 650 mg via ORAL

## 2011-11-23 MED ORDER — SODIUM CHLORIDE 0.9 % IV SOLN
Freq: Once | INTRAVENOUS | Status: AC
  Administered 2011-11-23: 10:00:00 via INTRAVENOUS

## 2011-11-23 MED ORDER — SODIUM CHLORIDE 0.9 % IV SOLN
600.0000 mg | Freq: Once | INTRAVENOUS | Status: AC
  Administered 2011-11-23: 600 mg via INTRAVENOUS
  Filled 2011-11-23: qty 60

## 2011-11-23 NOTE — Progress Notes (Signed)
[facility]  Telephone:(336) [phone] Fax:(336) [phone]   OFFICE PROGRESS NOTE   Cc:  MCALHANY,CHRISTOPHER, MD  DIAGNOSIS: History of stage III symptomatic non-Hodgkin's lymphoma; centroblastic variant follicular lymphoma; CD20 positive, CD79a positive, CD10 and CD23 faintly positive. Follicular international prognostic index score of 4, which was high risk.   PAST THERAPY: s/p 6 cycles of CHOP-Rituxan (first cycle was without Rituxan). Last cycle given on 10/21/09.   CURRENT THERAPY: maintenance Rituxan q 2 months x 2 years started on 12/02/09   INTERVAL HISTORY: [name] 71 y.o. female returns for regular follow up with her daughter.  She reports feeling well.  She has good appetite and is gaining weight.  She denies abdominal pain.  She denies palpable node swelling.  She has chronic back and hip pain since fracture last year.  However, she is still ambulatory and is independent of activities of daily living.  Patient denies fatigue, headache, visual changes, confusion, drenching night sweats, palpable lymph node swelling, mucositis, odynophagia, dysphagia, nausea vomiting, jaundice, chest pain, palpitation, shortness of breath, dyspnea on exertion, productive cough, gum bleeding, epistaxis, hematemesis, hemoptysis, abdominal pain, abdominal swelling, early satiety, melena, hematochezia, hematuria, skin rash, spontaneous bleeding, joint swelling, joint pain, heat or cold intolerance, bowel bladder incontinence, back pain, focal motor weakness, paresthesia, depression, suicidal or homocidal ideation, feeling hopelessness.   Past Medical History  Diagnosis Date  . Atherosclerosis of native arteries of the extremities with ulceration   . Lymphoma, non Hodgkin's   . Hypertension   . CAD (coronary artery disease)   . Myocardial infarction   . DVT (deep venous thrombosis)   . Neuromuscular disorder 02/2009  . Osteoporosis 08/17/2011  . Follicular lymphoma dx'd  05/2009    chemo R-CHOP comp 09/2009; maintenance  Rituxan from 11/2009 to 09/2011  . Hodgkin's disease 05/2009    Non-Hodgkin's Disease-Lymphoma    Past Surgical History  Procedure Date  . Coronary angioplasty with stent placement   . Femoral-popliteal bypass graft 02/15/10    Left CFA to above knee popliteal BPG by Dr. Leonides Sake  . Toe amputation 03/13/10    Left Great toe by Dr. Leonides Sake  . Angioplasty / stenting femoral 08/28/10    Left distal anastomosis, angioplasty w/stent by Dr. Leonides Sake  . Appendectomy   . Tonsillectomy   . Cholecystectomy     Gall bladder  . Femur im nail 04/05/2011    Procedure: INTRAMEDULLARY (IM) NAIL FEMORAL;  Surgeon: Mable Paris, MD;  Location: [facility];  Service: Orthopedics;  Laterality: Left;  Left hip trochanteric nail    Current Outpatient Prescriptions  Medication Sig Dispense Refill  . acetaminophen (TYLENOL) 500 MG tablet Take 500 mg by mouth every 6 (six) hours as needed. Takes for pain      . alendronate (FOSAMAX) 70 MG tablet Take 1 tablet (70 mg total) by mouth every 7 (seven) days. Take with a full glass of water on an empty stomach.  4 tablet  11  . amLODipine (NORVASC) 5 MG tablet TAKE 1 TABLET BY MOUTH EVERY DAY  30 tablet  6  . aspirin 81 MG tablet Take 81 mg by mouth daily.       . calcium-vitamin D 250-100 MG-UNIT per tablet Take 1 tablet by mouth 2 (two) times daily.      . clopidogrel (PLAVIX) 75 MG tablet Take 1 tablet (75 mg total) by mouth daily.  30 tablet  11  . CRESTOR 40 MG tablet TAKE 1  TABLET BY MOUTH EVERY DAY  30 tablet  6  . magnesium oxide (MAG-OX) 400 MG tablet TAKE 1 TABLET TWICE A DAY  60 tablet  10  . metoprolol (LOPRESSOR) 50 MG tablet Take 0.5 tablets (25 mg total) by mouth 2 (two) times daily.  60 tablet  10  . Multiple Vitamins-Minerals (MULTIVITAMIN WITH MINERALS) tablet Take 1 tablet by mouth daily.       . nitroGLYCERIN (NITROSTAT) 0.4 MG SL tablet Place 0.4 mg under the tongue every 5 (five)  minutes as needed.         ALLERGIES:   has no known allergies.  REVIEW OF SYSTEMS:  The rest of the 14-point review of system was negative.   Filed Vitals:   11/23/11 0933  BP: 150/78  Pulse: 64  Temp: 97 F (36.1 C)   Wt Readings from Last 3 Encounters:  11/23/11 156 lb 1.6 oz (70.806 kg)  09/28/11 153 lb 3.2 oz (69.491 kg)  09/21/11 150 lb 14.4 oz (68.448 kg)   ECOG Performance status: 1  PHYSICAL EXAMINATION:   General:  well-nourished woman, in no acute distress.  Eyes:  no scleral icterus.  ENT:  There were no oropharyngeal lesions.  Neck was without thyromegaly.  Lymphatics:  Negative cervical, supraclavicular or axillary adenopathy.  Respiratory: lungs were clear bilaterally without wheezing or crackles.  Cardiovascular:  Regular rate and rhythm, S1/S2, without murmur, rub or gallop.  There was no pedal edema.  GI:  abdomen was soft, flat, nontender, nondistended, without organomegaly.  Muscoloskeletal:  no spinal tenderness of palpation of vertebral spine.  Skin exam was without echymosis, petichae.  Neuro exam was nonfocal.  Patient needed some assistance to get on and off exam table due to her stiff back.  Gait was normal.  Patient was alerted and oriented.  Attention was good.   Language was appropriate.  Mood was normal without depression.  Speech was not pressured.  Thought content was not tangential.    LABORATORY/RADIOLOGY DATA:  Lab Results  Component Value Date   WBC 7.8 11/23/2011   HGB 13.8 11/23/2011   HCT 41.0 11/23/2011   PLT 163 11/23/2011   GLUCOSE 105* 09/28/2011   CHOL  Value: 131        ATP III CLASSIFICATION:  <200     mg/dL   Desirable  161-096  mg/dL   Borderline High  >=045    mg/dL   High        40/98/1191   TRIG 93 03/21/2009   HDL 35* 03/21/2009   LDLCALC  Value: 77        Total Cholesterol/HDL:CHD Risk Coronary Heart Disease Risk Table                     Men   Women  1/2 Average Risk   3.4   3.3  Average Risk       5.0   4.4  2 X Average Risk   9.6    7.1  3 X Average Risk  23.4   11.0        Use the calculated Patient Ratio above and the CHD Risk Table to determine the patient's CHD Risk.        ATP III CLASSIFICATION (LDL):  <100     mg/dL   Optimal  478-295  mg/dL   Near or Above                    Optimal  130-159  mg/dL   Borderline  161-096  mg/dL   High  >045     mg/dL   Very High 40/98/1191   ALKPHOS 122* 09/28/2011   ALT 23 09/28/2011   AST 29 09/28/2011   NA 142 09/28/2011   K 4.1 09/28/2011   CL 104 09/28/2011   CREATININE 0.89 09/28/2011   BUN 21 09/28/2011   CO2 28 09/28/2011   INR 0.88 04/04/2011   HGBA1C  Value: 5.7 (NOTE) The ADA recommends the following therapeutic goal for glycemic control related to Hgb A1c measurement: Goal of therapy: <6.5 Hgb A1c  Reference: American Diabetes Association: Clinical Practice Recommendations 2010, Diabetes Care, 2010, 33: (Suppl  1). 03/20/2009   ASSESSMENT AND PLAN:  1. History of lymphoma: She has no evidence of disease recurrence on clinical history, physical exam, laboratory tests. I advised her to proceed with maintenance Rituxan . She has no side effect of Rituxan.  She and her daughter expressed informed understanding and wished to proceed with maintenance Rituxan today. Today completes her 2 years of maintenance Rituxan. 2. Hypertension: She is on metoprolol and amlodipine.  Her systolic blood pressure is slightly elevated.  I defer to her cardiologist to adjust this as appropriate.  3. Hx of arterial insufficiency s/p stent and bypass: she is on aspirin, plavix, rosuvastatin, beta blocker.  4. Follow up with [place] in 3-4 months for clinical exam.

## 2011-11-23 NOTE — Patient Instructions (Signed)
[facility] Discharge Instructions for Patients Receiving Chemotherapy  Today you received the following chemotherapy agent Rituxan.  To help prevent nausea and vomiting after your treatment, we encourage you to take your nausea medication. Begin taking it as often as prescribed for by Dr. Gaylyn Rong.    If you develop nausea and vomiting that is not controlled by your nausea medication, call the clinic. If it is after clinic hours your family physician or the after hours number for the clinic or go to the Emergency Department.   BELOW ARE SYMPTOMS THAT SHOULD BE REPORTED IMMEDIATELY:  *FEVER GREATER THAN 100.5 F  *CHILLS WITH OR WITHOUT FEVER  NAUSEA AND VOMITING THAT IS NOT CONTROLLED WITH YOUR NAUSEA MEDICATION  *UNUSUAL SHORTNESS OF BREATH  *UNUSUAL BRUISING OR BLEEDING  TENDERNESS IN MOUTH AND THROAT WITH OR WITHOUT PRESENCE OF ULCERS  *URINARY PROBLEMS  *BOWEL PROBLEMS  UNUSUAL RASH Items with * indicate a potential emergency and should be followed up as soon as possible.  One of the nurses will contact you 24 hours after your treatment. Please let the nurse know about any problems that you may have experienced. Feel free to call the clinic you have any questions or concerns. The clinic phone number is [phone].   I have been informed and understand all the instructions given to me. I know to contact the clinic, my physician, or go to the Emergency Department if any problems should occur. I do not have any questions at this time, but understand that I may call the clinic during office hours   should I have any questions or need assistance in obtaining follow up care.    __________________________________________  _____________  __________ Signature of Patient or Authorized Representative            Date                   Time    __________________________________________ Nurse's Signature

## 2011-11-26 ENCOUNTER — Other Ambulatory Visit: Payer: Self-pay | Admitting: Certified Registered Nurse Anesthetist

## 2011-11-28 ENCOUNTER — Telehealth: Payer: Self-pay | Admitting: Oncology

## 2011-11-28 NOTE — Telephone Encounter (Signed)
lmonvm for pt re appt for 10/1 and mailed schedule.

## 2011-11-30 ENCOUNTER — Other Ambulatory Visit: Payer: Medicare Other | Admitting: Lab

## 2011-11-30 ENCOUNTER — Ambulatory Visit: Payer: Medicare Other | Admitting: Oncology

## 2011-11-30 ENCOUNTER — Ambulatory Visit: Payer: Medicare Other

## 2011-12-04 ENCOUNTER — Telehealth: Payer: Self-pay | Admitting: Cardiovascular Disease

## 2011-12-04 NOTE — Telephone Encounter (Signed)
Lipid and liver to be done on day of appt with Dr. Clifton James. Will order when here for appt.

## 2011-12-04 NOTE — Telephone Encounter (Signed)
New problem:  Need an order into the system for fasting lab work. Pt has appt on 8/23.

## 2011-12-05 ENCOUNTER — Telehealth: Payer: Self-pay | Admitting: Oncology

## 2011-12-05 NOTE — Telephone Encounter (Signed)
appts made as daughter called to r/s her appt

## 2011-12-07 ENCOUNTER — Other Ambulatory Visit: Payer: Self-pay | Admitting: Cardiovascular Disease

## 2011-12-15 IMAGING — US IR FLUORO GUIDE CV LINE*R*
1 series · 1 of 1 positions shown · non-contrast
Comparison: none

CLINICAL DATA: Newly diagnosed non-Hodgkin's lymphoma.  The patient
requires a Port-A-Cath to begin chemotherapy.

[Series 1: sp us guide vasc access*right* · 1 of 1 slices shown]
[im 1/1]
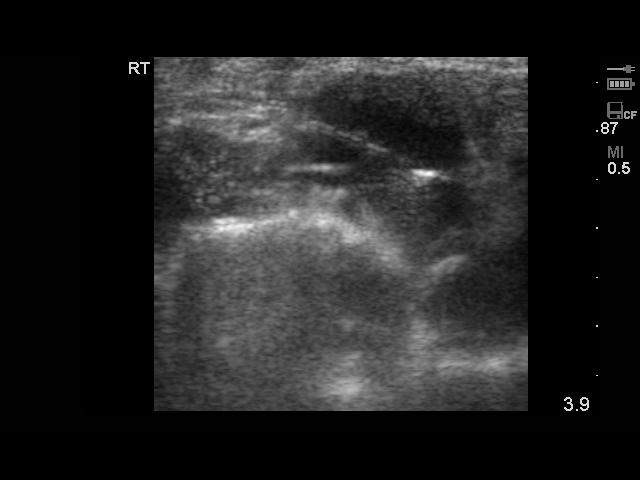

[1 of 1 positions shown; findings below may reference images not displayed]

IMPLANTED PORT A CATH PLACEMENT WITH ULTRASOUND AND FLUOROSCOPIC
GUIDANCE

Sedation:  2.0 mg IV Versed; 50 mcg IV Fentanyl.

Total Moderate Sedation Time:  50 minutes.

Additional Medications:  1 gram IV Ancef.  IV antibiotic was given
in a appropriate time interval prior to skin puncture.

Fluoroscopy Time:  0.6 minutes.

Procedure:  The procedure, risks, benefits, and alternatives were
explained to the patient.  Questions regarding the procedure were
encouraged and answered.  The patient understands and consents to
the procedure.

The right neck and chest were prepped with chlorhexidine in a
sterile fashion, and a sterile drape was applied covering the
operative field.  Maximum barrier sterile technique with sterile
gowns and gloves were used for the procedure.  Local anesthesia was
provided with 1% lidocaine and lidocaine with epinephrine.

After creating a small venotomy incision, a 21 gauge needle was
advanced into the right internal jugular vein under direct, real-
time ultrasound guidance.  Ultrasound image documentation was
performed.  After securing guidewire access, an 8 Fr dilator was
placed.  A J-wire was kinked to measure appropriate catheter
length.

A subcutaneous port pocket was then created along the upper chest
wall utilizing sharp and blunt dissection.  Portable cautery was
utilized.  The pocket was irrigated with sterile saline.

A single lumen power injectable port was chosen for placement.  The
8 Fr catheter was tunneled from the port pocket site to the
venotomy incision.  The port was placed in the pocket and secured
with two Ethilon tacking sutures.  External catheter was trimmed to
appropriate length based on guidewire measurement.

At the venotomy, an 8 Fr peel-away sheath was placed over a
guidewire.  The catheter was then placed through the sheath and the
sheath removed.  Final catheter positioning was confirmed and
documented with a fluoroscopic spot image.  The port was accessed
with a needle and aspirated and flushed with saline.  The needle
was removed.

The venotomy and port pocket incisions were closed with
subcutaneous 3-0 Monocryl and subcuticular 4-0 Vicryl.  Dermabond
was applied to both incisions.

Complications: None.  No pneumothorax.
FINDINGS: After catheter placement, the tip lies at the cavoatrial
junction.  The catheter aspirates normally and is ready for
immediate use.
IMPRESSION: Placement of single lumen port a cath via right internal jugular
vein.  The catheter tip lies at the cavoatrial junction.  A power
injectable port a cath was placed and is ready for immediate use.

## 2012-01-18 ENCOUNTER — Encounter: Payer: Self-pay | Admitting: Cardiovascular Disease

## 2012-01-18 ENCOUNTER — Ambulatory Visit (INDEPENDENT_AMBULATORY_CARE_PROVIDER_SITE_OTHER): Payer: Medicare Other | Admitting: Cardiovascular Disease

## 2012-01-18 VITALS — BP 124/87 | HR 66 | Ht 65.0 in | Wt 157.0 lb

## 2012-01-18 DIAGNOSIS — I350 Nonrheumatic aortic (valve) stenosis: Secondary | ICD-10-CM

## 2012-01-18 DIAGNOSIS — I359 Nonrheumatic aortic valve disorder, unspecified: Secondary | ICD-10-CM

## 2012-01-18 DIAGNOSIS — I2510 Atherosclerotic heart disease of native coronary artery without angina pectoris: Secondary | ICD-10-CM

## 2012-01-18 DIAGNOSIS — E785 Hyperlipidemia, unspecified: Secondary | ICD-10-CM

## 2012-01-18 LAB — HEPATIC FUNCTION PANEL
Alkaline Phosphatase: 82 U/L (ref 39–117)
Bilirubin, Direct: 0.1 mg/dL (ref 0.0–0.3)
Total Bilirubin: 0.3 mg/dL (ref 0.3–1.2)

## 2012-01-18 LAB — LIPID PANEL
LDL Cholesterol: 19 mg/dL (ref 0–99)
Total CHOL/HDL Ratio: 2

## 2012-01-18 NOTE — Progress Notes (Signed)
History of Present Illness: 71 yo WF with history of Non-Hodgkins lymphoma, tobacco abuse, HTN, hyperlipidemia, PAD and CAD here today for follow up. She was admitted to [facility] with inferior STEMI 03/20/09 at which time she was found to have a total occlusion of the proximal RCA at the area of a prior stent. A drug eluting stent was deployed in the proximal RCA. Her LV gram and echo showed basal to mid inferior and posterior wall hypokinesis with overall preserved EF. During admission she complained of bilateral lower ext pain with ambulation and weakness with ambulation. We ordered lower extremity arterial dopplers that showed total occlusion of both SFA at the origin with ABI of 0.60 bilaterally. There was also high grade right common femoral artery stenosis. She was noted to have uncontrolled HTN and unequal blood pressures in her arms. A CT angiogram of the chest/neck/abdomen was ordered. This showed stenosis of the left subclavian artery, mildly dilated aortic root, mild disease in bilateral renal arteries. There is diffuse vascular disease including the mesenteric vessels. Unfortunately, the CT scan also showed a right paraspinal soft tissue mass as well as diffuse retroperitoneal and mesenteric adenopathy. She was then diagnosed with Stage III Non-Hodgkins lymphoma and is undergoing treatment under the guidance of Dr. Jethro Bolus. She also developed a DVT and was treated with coumadin.  In the Fall of 2011, she hit her left leg and developed an ulcer over the anterior aspect of the left leg, just above the ankle. She was unable to heal this area. Dr. Imogene Burn performed left common femoral artery to left popliteal artery bypass on 02/15/10. She then had her left great toe ampuated on 03/14/10 by Dr. Imogene Burn. In April she had placement of a stent in the distal portion of the bypass at the anastamosis with the popliteal artery per Dr. Imogene Burn. Dr. Imogene Burn is following her vascular disease. Recent non-invasive studies April 2013  with patent bypass graft in left leg in [facility] office with ABI of 0.67 on the right and 1.04 on the left. She is finishing up her chemotherapy. Her NHL is felt to be in remission. She fell in November and broke her left hip. Dr. Ave Filter put a pin in her left hip. Echo 9/11 with normal LV function and mild aortic stenosis. Last seen in Oncology June 2013 and she is felt to be in remission. She is off of chemotherapy now.   She is here today for cardiac follow up. She has been feeling great. She has had no chest pain, SOB, palpitations, near syncope or syncope. She has been taking all of her medications.   Primary Care Physician: None  Last Lipid Profile: Due today   Past Medical History  Diagnosis Date  . Atherosclerosis of native arteries of the extremities with ulceration   . Lymphoma, non Hodgkin's   . Hypertension   . CAD (coronary artery disease)   . Myocardial infarction   . DVT (deep venous thrombosis)   . Neuromuscular disorder 02/2009  . Osteoporosis 08/17/2011  . Follicular lymphoma dx'd 05/2009    chemo R-CHOP comp 09/2009; maintenance  Rituxan from 11/2009 to 09/2011  . Hodgkin's disease 05/2009    Non-Hodgkin's Disease-Lymphoma    Past Surgical History  Procedure Date  . Coronary angioplasty with stent placement   . Femoral-popliteal bypass graft 02/15/10    Left CFA to above knee popliteal BPG by Dr. Leonides Sake  . Toe amputation 03/13/10    Left Great toe by Dr. Leonides Sake  .  Angioplasty / stenting femoral 08/28/10    Left distal anastomosis, angioplasty w/stent by Dr. Leonides Sake  . Appendectomy   . Tonsillectomy   . Cholecystectomy     Gall bladder  . Femur im nail 04/05/2011    Procedure: INTRAMEDULLARY (IM) NAIL FEMORAL;  Surgeon: Mable Paris, MD;  Location: [facility];  Service: Orthopedics;  Laterality: Left;  Left hip trochanteric nail    Current Outpatient Prescriptions  Medication Sig Dispense Refill  . acetaminophen (TYLENOL) 500 MG tablet Take 500 mg  by mouth every 6 (six) hours as needed. Takes for pain      . alendronate (FOSAMAX) 70 MG tablet Take 1 tablet (70 mg total) by mouth every 7 (seven) days. Take with a full glass of water on an empty stomach.  4 tablet  11  . amLODipine (NORVASC) 5 MG tablet TAKE 1 TABLET BY MOUTH EVERY DAY  30 tablet  6  . aspirin 81 MG tablet Take 81 mg by mouth daily.       . calcium-vitamin D 250-100 MG-UNIT per tablet Take 1 tablet by mouth 2 (two) times daily.      . clopidogrel (PLAVIX) 75 MG tablet TAKE 1 TABLET BY MOUTH DAILY.  30 tablet  11  . CRESTOR 40 MG tablet TAKE 1 TABLET BY MOUTH EVERY DAY  30 tablet  6  . magnesium oxide (MAG-OX) 400 MG tablet TAKE 1 TABLET TWICE A DAY  60 tablet  10  . metoprolol (LOPRESSOR) 50 MG tablet Take 0.5 tablets (25 mg total) by mouth 2 (two) times daily.  60 tablet  10  . Multiple Vitamins-Minerals (MULTIVITAMIN WITH MINERALS) tablet Take 1 tablet by mouth daily.       . nitroGLYCERIN (NITROSTAT) 0.4 MG SL tablet Place 0.4 mg under the tongue every 5 (five) minutes as needed.         No Known Allergies  History   Social History  . Marital Status: Divorced    Spouse Name: N/A    Number of Children: N/A  . Years of Education: N/A   Occupational History  . Not on file.   Social History Main Topics  . Smoking status: Former Smoker    Types: Cigarettes    Quit date: 02/25/2009  . Smokeless tobacco: Not on file  . Alcohol Use: No  . Drug Use: Yes  . Sexually Active: Not on file   Other Topics Concern  . Not on file   Social History Narrative  . No narrative on file    Family History  Problem Relation Age of Onset  . Heart attack Father   . Cancer Brother     Review of Systems:  As stated in the HPI and otherwise negative.   BP 124/87  Pulse 66  Ht 5\' 5"  (1.651 m)  Wt 157 lb (71.215 kg)  BMI 26.13 kg/m2  Physical Examination: General: Well developed, well nourished, NAD HEENT: OP clear, mucus membranes moist SKIN: warm, dry. No  rashes. Neuro: No focal deficits Musculoskeletal: Muscle strength 5/5 all ext Psychiatric: Mood and affect normal Neck: No JVD, no carotid bruits, no thyromegaly, no lymphadenopathy. Lungs:Clear bilaterally, no wheezes, rhonci, crackles Cardiovascular: Regular rate and rhythm. Systolic  Murmur noted. No gallops or rubs. Abdomen:Soft. Bowel sounds present. Non-tender.  Extremities: No lower extremity edema. Pulses are non-palpable in the bilateral DP/PT.  EKG: NSR, rate 66 bpm. LVH.   Assessment and Plan:   1. CAD: Stable. She is on good medical therapy.  Will continue ASA and Plavix. BP is well controlled. Will check lipids and LFTs today.   2. Aortic stenosis: Mild by echo 2011. Will repeat echo to assess AS and also assess LVEF post chemotherapy.   3. PAD: Followed in [facility] by Dr. Imogene Burn  4. HTN: BP well controlled today. No changes  5. Hyperlipidemia: Continue statin. Check lipids and LFTs today.

## 2012-01-18 NOTE — Patient Instructions (Signed)

## 2012-01-25 ENCOUNTER — Other Ambulatory Visit: Payer: Self-pay | Admitting: Cardiovascular Disease

## 2012-01-25 ENCOUNTER — Ambulatory Visit (HOSPITAL_COMMUNITY): Payer: Medicare Other | Attending: Cardiovascular Disease | Admitting: Radiology

## 2012-01-25 DIAGNOSIS — I350 Nonrheumatic aortic (valve) stenosis: Secondary | ICD-10-CM

## 2012-01-25 DIAGNOSIS — I2510 Atherosclerotic heart disease of native coronary artery without angina pectoris: Secondary | ICD-10-CM

## 2012-01-25 DIAGNOSIS — Z87891 Personal history of nicotine dependence: Secondary | ICD-10-CM | POA: Insufficient documentation

## 2012-01-25 DIAGNOSIS — I379 Nonrheumatic pulmonary valve disorder, unspecified: Secondary | ICD-10-CM | POA: Insufficient documentation

## 2012-01-25 DIAGNOSIS — I10 Essential (primary) hypertension: Secondary | ICD-10-CM | POA: Insufficient documentation

## 2012-01-25 DIAGNOSIS — I079 Rheumatic tricuspid valve disease, unspecified: Secondary | ICD-10-CM | POA: Insufficient documentation

## 2012-01-25 DIAGNOSIS — I059 Rheumatic mitral valve disease, unspecified: Secondary | ICD-10-CM | POA: Insufficient documentation

## 2012-01-25 MED ORDER — NITROGLYCERIN 0.4 MG SL SUBL: 0.4000 mg | SUBLINGUAL_TABLET | SUBLINGUAL | Status: DC | PRN

## 2012-01-25 NOTE — Telephone Encounter (Signed)
Fax Received. Refill Completed. [name] (R.M.A)   

## 2012-01-25 NOTE — Progress Notes (Signed)
Echocardiogram performed.  

## 2012-01-31 ENCOUNTER — Telehealth: Payer: Self-pay | Admitting: Cardiovascular Disease

## 2012-01-31 ENCOUNTER — Telehealth: Payer: Self-pay | Admitting: Cardiology

## 2012-01-31 NOTE — Telephone Encounter (Signed)
Pt rtn call from Pat from Tuesday re test results

## 2012-01-31 NOTE — Telephone Encounter (Signed)
Results of ECHO given to patient

## 2012-01-31 NOTE — Telephone Encounter (Signed)
error 

## 2012-02-26 ENCOUNTER — Ambulatory Visit: Payer: Medicare Other | Admitting: Oncology

## 2012-02-26 ENCOUNTER — Other Ambulatory Visit: Payer: Medicare Other | Admitting: Lab

## 2012-02-29 ENCOUNTER — Ambulatory Visit: Payer: Medicare Other | Admitting: Oncology

## 2012-02-29 ENCOUNTER — Other Ambulatory Visit: Payer: Medicare Other | Admitting: Lab

## 2012-03-21 ENCOUNTER — Encounter: Payer: Self-pay | Admitting: Oncology

## 2012-03-21 ENCOUNTER — Other Ambulatory Visit (HOSPITAL_BASED_OUTPATIENT_CLINIC_OR_DEPARTMENT_OTHER): Payer: Medicare Other | Admitting: Lab

## 2012-03-21 ENCOUNTER — Ambulatory Visit (HOSPITAL_BASED_OUTPATIENT_CLINIC_OR_DEPARTMENT_OTHER): Payer: Medicare Other | Admitting: Oncology

## 2012-03-21 ENCOUNTER — Telehealth: Payer: Self-pay | Admitting: Oncology

## 2012-03-21 VITALS — BP 158/82 | HR 67 | Temp 97.2°F | Resp 20 | Ht 65.0 in | Wt 157.4 lb

## 2012-03-21 DIAGNOSIS — C8299 Follicular lymphoma, unspecified, extranodal and solid organ sites: Secondary | ICD-10-CM

## 2012-03-21 DIAGNOSIS — M810 Age-related osteoporosis without current pathological fracture: Secondary | ICD-10-CM

## 2012-03-21 DIAGNOSIS — C8290 Follicular lymphoma, unspecified, unspecified site: Secondary | ICD-10-CM

## 2012-03-21 DIAGNOSIS — I10 Essential (primary) hypertension: Secondary | ICD-10-CM

## 2012-03-21 DIAGNOSIS — I771 Stricture of artery: Secondary | ICD-10-CM

## 2012-03-21 LAB — CBC WITH DIFFERENTIAL/PLATELET
BASO%: 0.6 % (ref 0.0–2.0)
HCT: 42.5 % (ref 34.8–46.6)
MCHC: 33.4 g/dL (ref 31.5–36.0)
MONO#: 0.6 10*3/uL (ref 0.1–0.9)
RBC: 4.51 10*6/uL (ref 3.70–5.45)
RDW: 13.9 % (ref 11.2–14.5)
WBC: 7.8 10*3/uL (ref 3.9–10.3)
lymph#: 1.4 10*3/uL (ref 0.9–3.3)

## 2012-03-21 LAB — COMPREHENSIVE METABOLIC PANEL (CC13)
ALT: 26 U/L (ref 0–55)
AST: 25 U/L (ref 5–34)
CO2: 28 mEq/L (ref 22–29)
Calcium: 10.7 mg/dL — ABNORMAL HIGH (ref 8.4–10.4)
Chloride: 105 mEq/L (ref 98–107)
Potassium: 4.2 mEq/L (ref 3.5–5.1)
Sodium: 144 mEq/L (ref 136–145)
Total Protein: 6.7 g/dL (ref 6.4–8.3)

## 2012-03-21 LAB — LACTATE DEHYDROGENASE (CC13): LDH: 232 U/L — ABNORMAL HIGH (ref 125–220)

## 2012-03-21 NOTE — Telephone Encounter (Signed)
appts made and daughter placed in her sch    aom

## 2012-03-21 NOTE — Progress Notes (Signed)
[facility]  Telephone:([phone]) [phone] [phone]   OFFICE PROGRESS NOTE   Cc:  MCALHANY,CHRISTOPHER, MD  DIAGNOSIS: History of stage III symptomatic non-Hodgkin's lymphoma; centroblastic variant follicular lymphoma; CD20 positive, CD79a positive, CD10 and CD23 faintly positive. Follicular international prognostic index score of 4, which was high risk.   PAST THERAPY: s/p 6 cycles of CHOP-Rituxan (first cycle was without Rituxan). Last cycle given on 10/21/09.  She was on maintenance Rituxan q 2 months x 2 years between 11/2009 and 10/2011.   CURRENT THERAPY: watchful observation.    INTERVAL HISTORY: [name] 71 y.o. female returns for regular follow up with her daughter.  She reports doing well.  She has chronic joint pain.  However, she is able to ambulate around the house.   Patient denies fever, anorexia, weight loss, fatigue, headache, visual changes, confusion, drenching night sweats, palpable lymph node swelling, mucositis, odynophagia, dysphagia, nausea vomiting, jaundice, chest pain, palpitation, shortness of breath, dyspnea on exertion, productive cough, gum bleeding, epistaxis, hematemesis, hemoptysis, abdominal pain, abdominal swelling, early satiety, melena, hematochezia, hematuria, skin rash, spontaneous bleeding, heat or cold intolerance, bowel bladder incontinence, back pain, focal motor weakness, paresthesia, depression.    Past Medical History  Diagnosis Date  . Atherosclerosis of native arteries of the extremities with ulceration(440.23)   . Hypertension   . CAD (coronary artery disease)   . Myocardial infarction   . DVT (deep venous thrombosis)   . Neuromuscular disorder 02/2009  . Osteoporosis 08/17/2011  . Follicular lymphoma dx'd 05/2009    chemo R-CHOP comp 09/2009; maintenance  Rituxan from 11/2009 to 09/2011    Past Surgical History  Procedure Date  . Coronary angioplasty with stent placement   . Femoral-popliteal bypass graft  02/15/10    Left CFA to above knee popliteal BPG by Dr. Leonides Sake  . Toe amputation 03/13/10    Left Great toe by Dr. Leonides Sake  . Angioplasty / stenting femoral 08/28/10    Left distal anastomosis, angioplasty w/stent by Dr. Leonides Sake  . Appendectomy   . Tonsillectomy   . Cholecystectomy     Gall bladder  . Femur im nail 04/05/2011    Procedure: INTRAMEDULLARY (IM) NAIL FEMORAL;  Surgeon: Mable Paris, MD;  Location: [facility];  Service: Orthopedics;  Laterality: Left;  Left hip trochanteric nail    Current Outpatient Prescriptions  Medication Sig Dispense Refill  . acetaminophen (TYLENOL) 500 MG tablet Take 500 mg by mouth every 6 (six) hours as needed. Takes for pain      . alendronate (FOSAMAX) 70 MG tablet Take 1 tablet (70 mg total) by mouth every 7 (seven) days. Take with a full glass of water on an empty stomach.  4 tablet  11  . amLODipine (NORVASC) 5 MG tablet TAKE 1 TABLET BY MOUTH EVERY DAY  30 tablet  6  . aspirin 81 MG tablet Take 81 mg by mouth daily.       . calcium-vitamin D 250-100 MG-UNIT per tablet Take 1 tablet by mouth 2 (two) times daily.      . clopidogrel (PLAVIX) 75 MG tablet TAKE 1 TABLET BY MOUTH DAILY.  30 tablet  11  . CRESTOR 40 MG tablet TAKE 1 TABLET BY MOUTH EVERY DAY  30 tablet  6  . magnesium oxide (MAG-OX) 400 MG tablet TAKE 1 TABLET TWICE A DAY  60 tablet  10  . metoprolol (LOPRESSOR) 50 MG tablet Take 50 mg by mouth 2 (two) times daily.      Marland Kitchen  Multiple Vitamins-Minerals (MULTIVITAMIN WITH MINERALS) tablet Take 1 tablet by mouth daily.       . nitroGLYCERIN (NITROSTAT) 0.4 MG SL tablet Place 1 tablet (0.4 mg total) under the tongue every 5 (five) minutes as needed.  25 tablet  5    ALLERGIES:   has no known allergies.  REVIEW OF SYSTEMS:  The rest of the 14-point review of system was negative.   Filed Vitals:   03/21/12 1427  BP: 158/82  Pulse: 67  Temp: 97.2 F (36.2 C)  Resp: 20   Wt Readings from Last 3 Encounters:  03/21/12  157 lb 6.4 oz (71.396 kg)  01/18/12 157 lb (71.215 kg)  11/23/11 156 lb 1.6 oz (70.806 kg)   ECOG Performance status: 1 due to DJD.   PHYSICAL EXAMINATION:   General:  well-nourished woman, in no acute distress.  Eyes:  no scleral icterus.  ENT:  There were no oropharyngeal lesions.  Neck was without thyromegaly.  Lymphatics:  Negative cervical, supraclavicular or axillary adenopathy.  Respiratory: lungs were clear bilaterally without wheezing or crackles.  Cardiovascular:  Regular rate and rhythm, S1/S2, without murmur, rub or gallop.  There was no pedal edema.  GI:  abdomen was soft, flat, nontender, nondistended, without organomegaly.  Muscoloskeletal:  no spinal tenderness of palpation of vertebral spine.  Skin exam was without echymosis, petichae.  Neuro exam was nonfocal.  Patient was able to get on and off exam table with some assistance.  Gait was normal.  Patient was alerted and oriented.  Attention was good.   Language was appropriate.  Mood was normal without depression.  Speech was not pressured.  Thought content was not tangential.     LABORATORY/RADIOLOGY DATA:  Lab Results  Component Value Date   WBC 7.8 03/21/2012   HGB 14.2 03/21/2012   HCT 42.5 03/21/2012   PLT 194 03/21/2012   GLUCOSE 79 11/23/2011   CHOL 119 01/18/2012   TRIG 126.0 01/18/2012   HDL 74.60 01/18/2012   LDLCALC 19 01/18/2012   ALKPHOS 82 01/18/2012   ALT 19 01/18/2012   AST 30 01/18/2012   NA 143 11/23/2011   K 4.0 11/23/2011   CL 105 11/23/2011   CREATININE 0.95 11/23/2011   BUN 20 11/23/2011   CO2 26 11/23/2011   INR 0.88 04/04/2011   HGBA1C  Value: 5.7 (NOTE) The ADA recommends the following therapeutic goal for glycemic control related to Hgb A1c measurement: Goal of therapy: <6.5 Hgb A1c  Reference: American Diabetes Association: Clinical Practice Recommendations 2010, Diabetes Care, 2010, 33: (Suppl  1). 03/20/2009    ASSESSMENT AND PLAN:   1. History of lymphoma: She continues to be in remission.  Next  CT scan will be due in 07/2012 which I ordered today.  After this CT scan, we can potentially follow her clinically without routine surveillance CT unless patient prefers to have it done.  2. Hypertension: She is on metoprolol and amlodipine.  3. Hx of arterial insufficiency s/p stent and bypass: she is on aspirin, plavix, rosuvastatin, beta blocker.  4. Osteoporosis:  She is on fosamax 70mg  PO once weekly, Calcium/Vit D.  5. Primary care:  I advised her to again find a PCP to arrange for screening colonoscopy, and Pap smear among other primary care issues.  6. Follow up in 6 months.     The length of time of the face-to-face encounter was 15  minutes. More than 50% of time was spent counseling and coordination of care.

## 2012-03-21 NOTE — Patient Instructions (Addendum)
1.  History of lymphoma:  On observation.  2.  Follow up:  In about 6 months with CT the day prior.  3.  Primary care physician, mammogram, colonoscopy, Papsmear up to date?

## 2012-03-23 ENCOUNTER — Other Ambulatory Visit: Payer: Self-pay | Admitting: Cardiovascular Disease

## 2012-03-24 ENCOUNTER — Other Ambulatory Visit: Payer: Medicare Other | Admitting: Lab

## 2012-03-24 ENCOUNTER — Ambulatory Visit: Payer: Medicare Other | Admitting: Oncology

## 2012-03-31 ENCOUNTER — Other Ambulatory Visit: Payer: Self-pay | Admitting: *Deleted

## 2012-03-31 DIAGNOSIS — I739 Peripheral vascular disease, unspecified: Secondary | ICD-10-CM

## 2012-03-31 DIAGNOSIS — Z48812 Encounter for surgical aftercare following surgery on the circulatory system: Secondary | ICD-10-CM

## 2012-04-04 ENCOUNTER — Ambulatory Visit: Payer: Medicare Other | Admitting: Neurosurgery

## 2012-04-17 ENCOUNTER — Other Ambulatory Visit: Payer: Self-pay | Admitting: Cardiovascular Disease

## 2012-04-22 NOTE — Telephone Encounter (Signed)
F/u Refill- magnesium oxide (MAG-OX) 400 (241.3 MG) MG tablet [Pharmacy Med Name: MAGNESIUM OXIDE 400 MG TABLET]    plz return call to pt to advice, RX called in on 11/21 to verified preferred pharmacy [facility] [address].  No refills has been processed for patient.  She can be reached at hm#

## 2012-04-23 ENCOUNTER — Other Ambulatory Visit: Payer: Self-pay | Admitting: Cardiovascular Disease

## 2012-05-02 IMAGING — CT CT CHEST W/ CM
2 of 4 series · 16 of 46 positions shown, 18 images · IV contrast (omnipaque)
Comparison: CT 09/08/2009

CT CHEST

CLINICAL DATA: Non Hodgkin's lymphoma.  Chemotherapy in progress.
1.  Stable to slightly newly decreased volume of

CT CHEST, ABDOMEN AND PELVIS WITH CONTRAST
TECHNIQUE: Multidetector CT imaging of the chest, abdomen and
pelvis was performed following the standard protocol during bolus
administration of intravenous contrast.
Contrast: 100 ml Omnipaque 300

[Series 2: cap with st · axial · 0.59mm/px · z∈[+1246,+1832]mm · 13 of 129 slices shown, 15 images]
[im 6/129  soft-tissue]
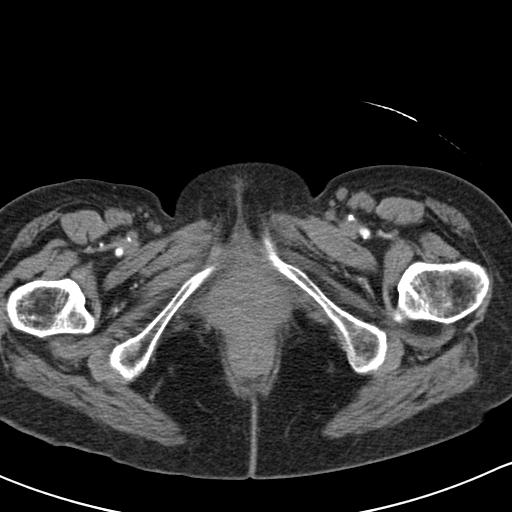
[im 6/129  bone]
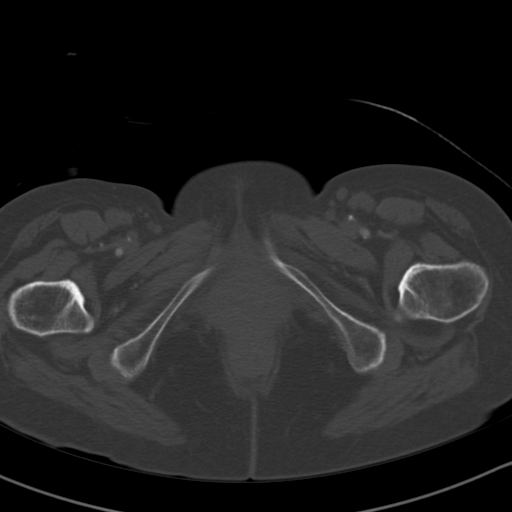
[im 16/129  soft-tissue]
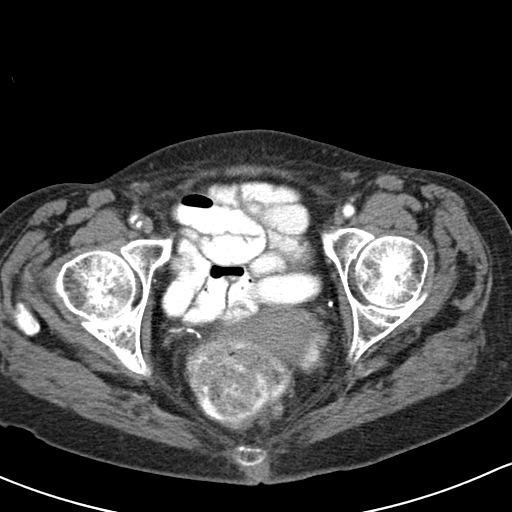
[im 26/129  soft-tissue]
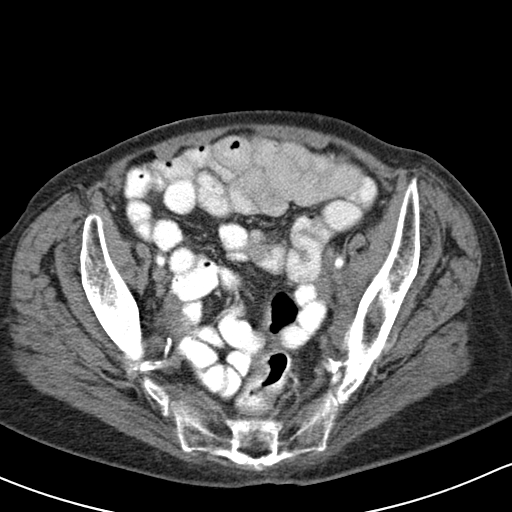
[im 36/129  soft-tissue]
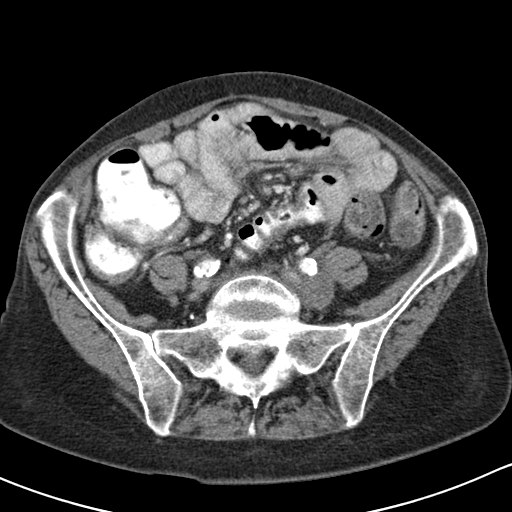
[im 47/129  soft-tissue]
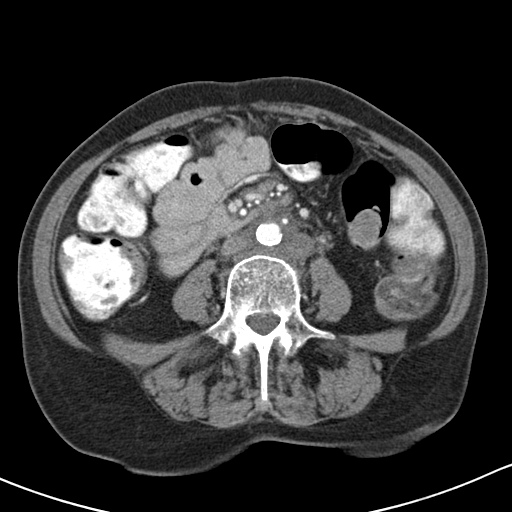
[im 57/129  soft-tissue]
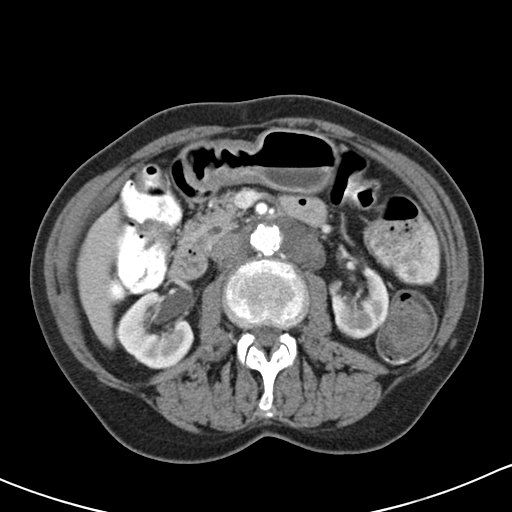
[im 67/129  soft-tissue]
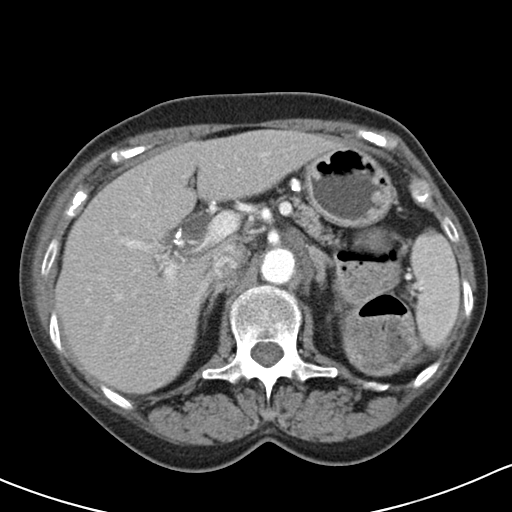
[im 72/129  soft-tissue]
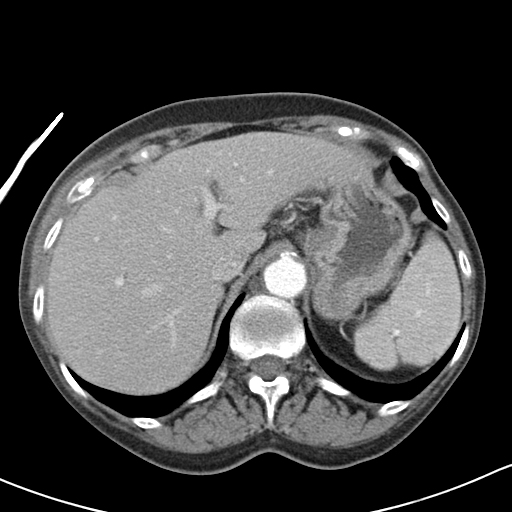
[im 82/129  soft-tissue]
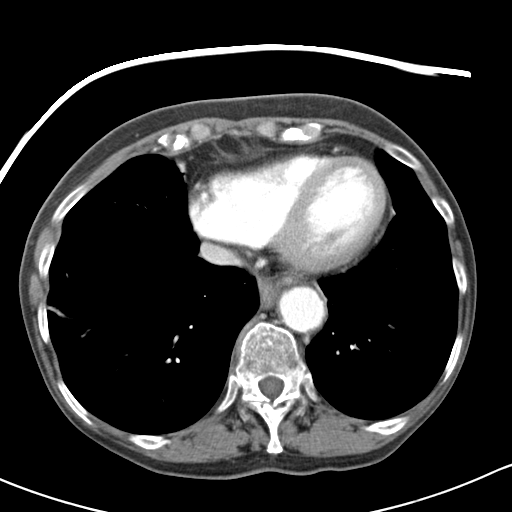
[im 82/129  bone]
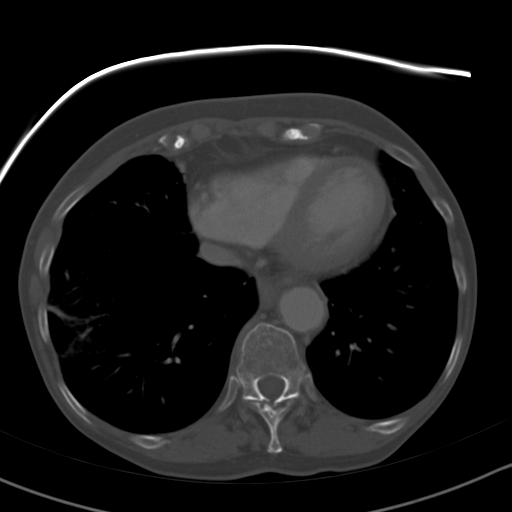
[im 93/129  soft-tissue]
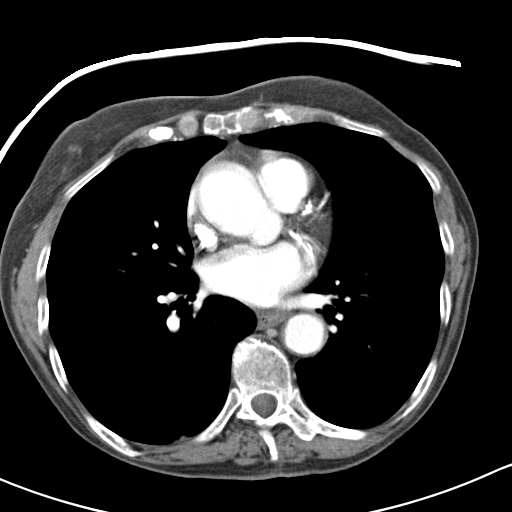
[im 103/129  soft-tissue]
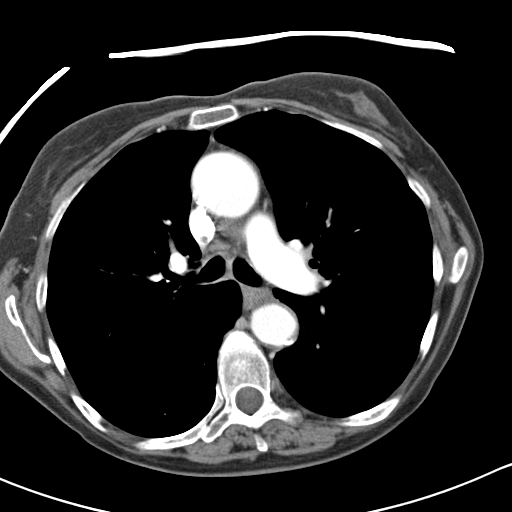
[im 113/129  soft-tissue]
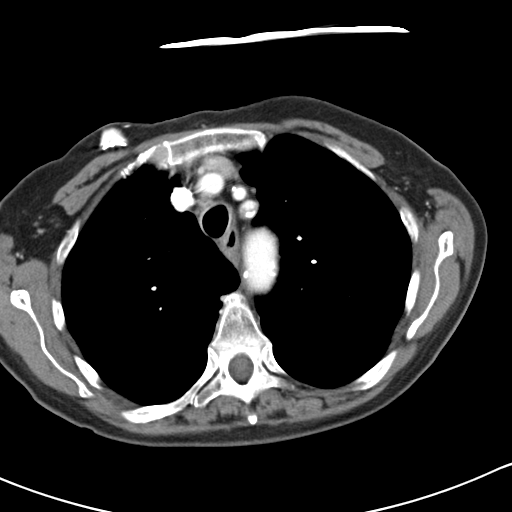
[im 123/129  soft-tissue]
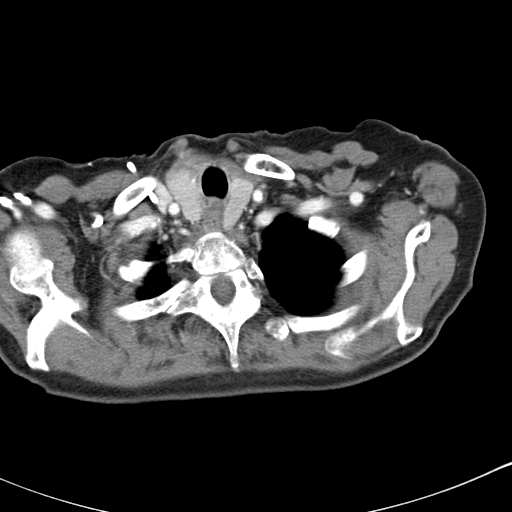

[Series 602: <mpr thick range> · coronal · 1.26mm/px · 3 of 89 slices shown]
[im 30/89  soft-tissue]
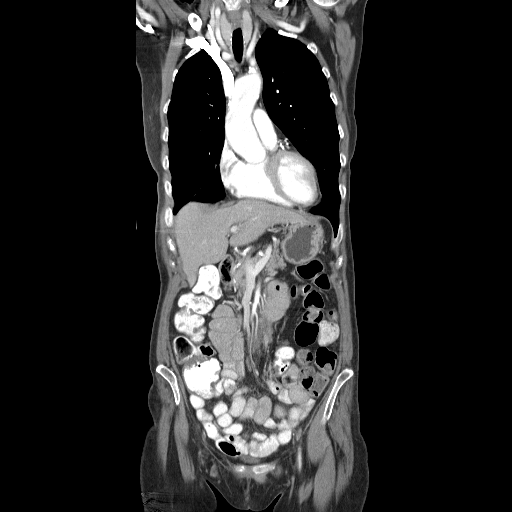
[im 40/89  soft-tissue]
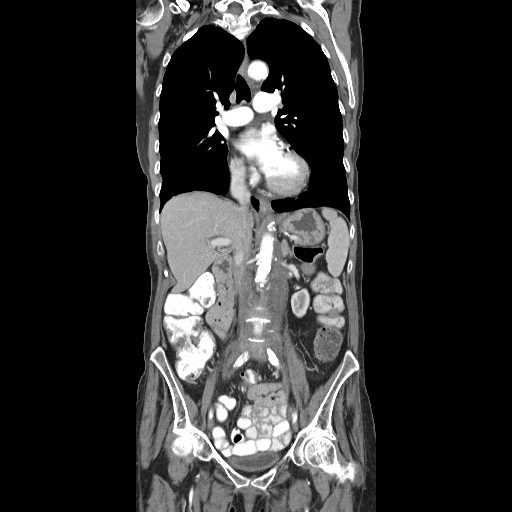
[im 49/89  soft-tissue]
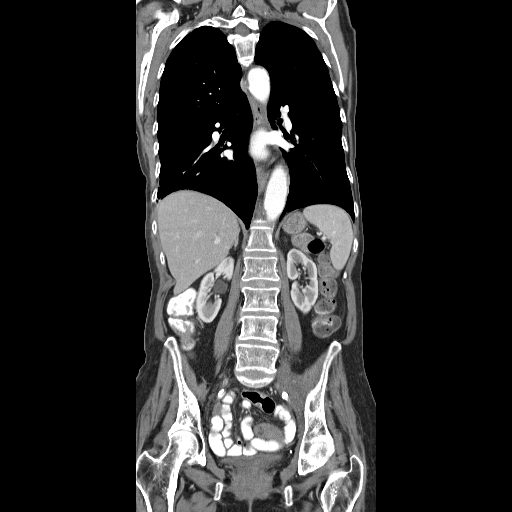

[16 of 46 positions shown; findings below may reference images not displayed]

FINDINGS: There is a port in the right anterior chest wall.  No
evidence of axillary or supraclavicular lymphadenopathy.  No
mediastinal or hilar lymphadenopathy.  The ascending aorta is
mildly dilated at 40 mm x 40 mm.  Coronary artery calcifications
present.  No pericardial effusion.

Review of  lung parenchyma demonstrates a small 3 mm subpleural
nodule in the left upper lobe (image 24).  Similar small subpleural
nodules in the superior right lower lobe (image 21).  There is mild
subpleural thickening in the right lower leg (image 34).
IMPRESSION: 1.  No clear evidence of lymphoma recurrence of pneumothorax.
2.  Subpleural thickening within the right lower lobe  is not seen
on prior.  Recommend  follow-up on next CT scan.
3.  Small nodule in the left upper lobe is unchanged from prior.

CT ABDOMEN AND PELVIS
FINDINGS: No focal hepatic lesion.  Mild biliary ductal dilatation
likely related to prior cholecystectomy.  The common bile duct is
also dilated to 12 mm which is also similar to prior.  The
pancreatic duct is mildly prominent which is also unchanged.

The spleen is normal volume.  The kidneys and adrenal glands appear
normal.  The stomach, small bowel, cecum, and colon appear normal.

There is a confluent retroperitoneal periaortic nodal mass.  This
mass measures 33 mm x 24 mm slightly decreased from 36 x 27 mm at
same  level on prior (image 75).  There is adenopathy extends in a
continuous fashion from the left renal vein to the aortic
bifurcation.  No new retroperitoneal adenopathy present.  No
periportal adenopathy evident.

No free fluid in the pelvis.  The bladder uterus are normal.
Ovaries appear normal.

There is no clear pelvic lymphadenopathy or inguinal
lymphadenopathy.

Review of the bone windows demonstrates no aggressive osseous
lesions.
IMPRESSION: 1.  Stable to slightly decreased volume of a confluent
retroperitoneal lymphadenopathy.
2.  No evidence of new adenopathy.
3.  Normal spleen.

## 2012-05-08 ENCOUNTER — Encounter: Payer: Self-pay | Admitting: Neurosurgery

## 2012-05-09 ENCOUNTER — Encounter: Payer: Self-pay | Admitting: Neurosurgery

## 2012-05-09 ENCOUNTER — Encounter (INDEPENDENT_AMBULATORY_CARE_PROVIDER_SITE_OTHER): Payer: Medicare Other | Admitting: Vascular Surgery

## 2012-05-09 ENCOUNTER — Ambulatory Visit (INDEPENDENT_AMBULATORY_CARE_PROVIDER_SITE_OTHER): Payer: Medicare Other | Admitting: Neurosurgery

## 2012-05-09 VITALS — BP 171/87 | HR 63 | Resp 16 | Ht 65.0 in | Wt 158.2 lb

## 2012-05-09 DIAGNOSIS — Z48812 Encounter for surgical aftercare following surgery on the circulatory system: Secondary | ICD-10-CM

## 2012-05-09 DIAGNOSIS — I739 Peripheral vascular disease, unspecified: Secondary | ICD-10-CM

## 2012-05-09 NOTE — Progress Notes (Signed)
[facility] PAD/PVD Office Note  CC: PVD surveillance Referring Physician: Imogene Burn  History of Present Illness: 71 year old patient of Dr. Imogene Burn is followed for serial ABIs and lower extremity bypass graft scan s/p.left common femoral artery to above knee  popliteal artery bypass and reverse ipsilateral greater saphenous vein with subsequent distal angioplasty and stenting in April 2012. The patient reports no claudication type symptoms or rest pain but does have lower extremity pain do to the ORIF she had last year.  Past Medical History  Diagnosis Date  . Atherosclerosis of native arteries of the extremities with ulceration(440.23)   . Hypertension   . CAD (coronary artery disease)   . Myocardial infarction   . DVT (deep venous thrombosis)   . Neuromuscular disorder 02/2009  . Osteoporosis 08/17/2011  . Follicular lymphoma dx'd 05/2009    chemo R-CHOP comp 09/2009; maintenance  Rituxan from 11/2009 to 09/2011    ROS: [x]  Positive   [ ]  Denies    General: [ ]  Weight loss, [ ]  Fever, [ ]  chills Neurologic: [ ]  Dizziness, [ ]  Blackouts, [ ]  Seizure [ ]  Stroke, [ ]  "Mini stroke", [ ]  Slurred speech, [ ]  Temporary blindness; [ ]  weakness in arms or legs, [ ]  Hoarseness Cardiac: [ ]  Chest pain/pressure, [ ]  Shortness of breath at rest [ ]  Shortness of breath with exertion, [ ]  Atrial fibrillation or irregular heartbeat Vascular: [ ]  Pain in legs with walking, [ ]  Pain in legs at rest, [ ]  Pain in legs at night,  [ ]  Non-healing ulcer, [ ]  Blood clot in vein/DVT,   Pulmonary: [ ]  Home oxygen, [ ]  Productive cough, [ ]  Coughing up blood, [ ]  Asthma,  [ ]  Wheezing Musculoskeletal:  [ ]  Arthritis, [ ]  Low back pain, [ ]  Joint pain Hematologic: [ ]  Easy Bruising, [ ]  Anemia; [ ]  Hepatitis Gastrointestinal: [ ]  Blood in stool, [ ]  Gastroesophageal Reflux/heartburn, [ ]  Trouble swallowing Urinary: [ ]  chronic Kidney disease, [ ]  on HD - [ ]  MWF or [ ]  TTHS, [ ]  Burning  with urination, [ ]  Difficulty urinating Skin: [ ]  Rashes, [ ]  Wounds Psychological: [ ]  Anxiety, [ ]  Depression   Social History History  Substance Use Topics  . Smoking status: Former Smoker    Types: Cigarettes    Quit date: 02/25/2009  . Smokeless tobacco: Never Used  . Alcohol Use: No    Family History Family History  Problem Relation Age of Onset  . Heart attack Father   . Cancer Brother     No Known Allergies  Current Outpatient Prescriptions  Medication Sig Dispense Refill  . acetaminophen (TYLENOL) 500 MG tablet Take 500 mg by mouth every 6 (six) hours as needed. Takes for pain      . alendronate (FOSAMAX) 70 MG tablet Take 1 tablet (70 mg total) by mouth every 7 (seven) days. Take with a full glass of water on an empty stomach.  4 tablet  11  . amLODipine (NORVASC) 5 MG tablet TAKE 1 TABLET BY MOUTH EVERY DAY  30 tablet  6  . aspirin 81 MG tablet Take 81 mg by mouth daily.       . calcium-vitamin D 250-100 MG-UNIT per tablet Take 1 tablet by mouth 2 (two) times daily.      . clopidogrel (PLAVIX) 75 MG tablet TAKE 1 TABLET BY MOUTH DAILY.  30 tablet  11  . CRESTOR 40 MG tablet TAKE 1  TABLET BY MOUTH EVERY DAY  30 tablet  6  . magnesium oxide (MAG-OX) 400 (241.3 MG) MG tablet TAKE 1 TABLET TWICE A DAY  60 tablet  10  . magnesium oxide (MAG-OX) 400 MG tablet TAKE 1 TABLET TWICE A DAY  60 tablet  10  . metoprolol (LOPRESSOR) 50 MG tablet Take 50 mg by mouth 2 (two) times daily.      . metoprolol (LOPRESSOR) 50 MG tablet TAKE 1 TABLET BY MOUTH TWICE A DAY  60 tablet  5  . Multiple Vitamins-Minerals (MULTIVITAMIN WITH MINERALS) tablet Take 1 tablet by mouth daily.       . nitroGLYCERIN (NITROSTAT) 0.4 MG SL tablet Place 1 tablet (0.4 mg total) under the tongue every 5 (five) minutes as needed.  25 tablet  5    Physical Examination  Filed Vitals:   05/09/12 1512  BP: 171/87  Pulse: 63  Resp: 16    Body mass index is 26.33 kg/(m^2).  General:  WDWN in NAD Gait:  Normal HEENT: WNL Eyes: Pupils equal Pulmonary: normal non-labored breathing , without Rales, rhonchi,  wheezing Cardiac: RRR, without  Murmurs, rubs or gallops; No carotid bruits Abdomen: soft, NT, no masses Skin: no rashes, ulcers noted Vascular Exam/Pulses: Palpable femoral pulses bilaterally, palpable but dampened PT and DP  Extremities without ischemic changes, no Gangrene , no cellulitis; no open wounds;  Musculoskeletal: no muscle wasting or atrophy  Neurologic: A&O X 3; Appropriate Affect ; SENSATION: normal; MOTOR FUNCTION:  moving all extremities equally. Speech is fluent/normal  Non-Invasive Vascular Imaging: Patent bypass graft with a ABI today of 0.71 and a monophasic PT with a biphasic DP on the right, 1.02 biphasic flow  ASSESSMENT/PLAN: Asymptomatic patient with no claudication, the patient will followup in 6 months with repeat graft scan and ABIs. The patient's questions were encouraged and answered, she is in agreement with this plan.  Lauree Chandler ANP  Clinic M.D.: Imogene Burn

## 2012-05-12 NOTE — Addendum Note (Signed)
Addended by: Sharee Pimple on: 05/12/2012 10:18 AM   Modules accepted: Orders

## 2012-06-20 ENCOUNTER — Other Ambulatory Visit: Payer: Self-pay | Admitting: Cardiovascular Disease

## 2012-07-20 IMAGING — CR DG CHEST 1V PORT
1 series · 1 of 1 positions shown · non-contrast
Comparison: 01/30/2010

CLINICAL DATA: Left lower show a cellulitis.  PICC line placement.

PORTABLE CHEST - 1 VIEW

[view not recorded]
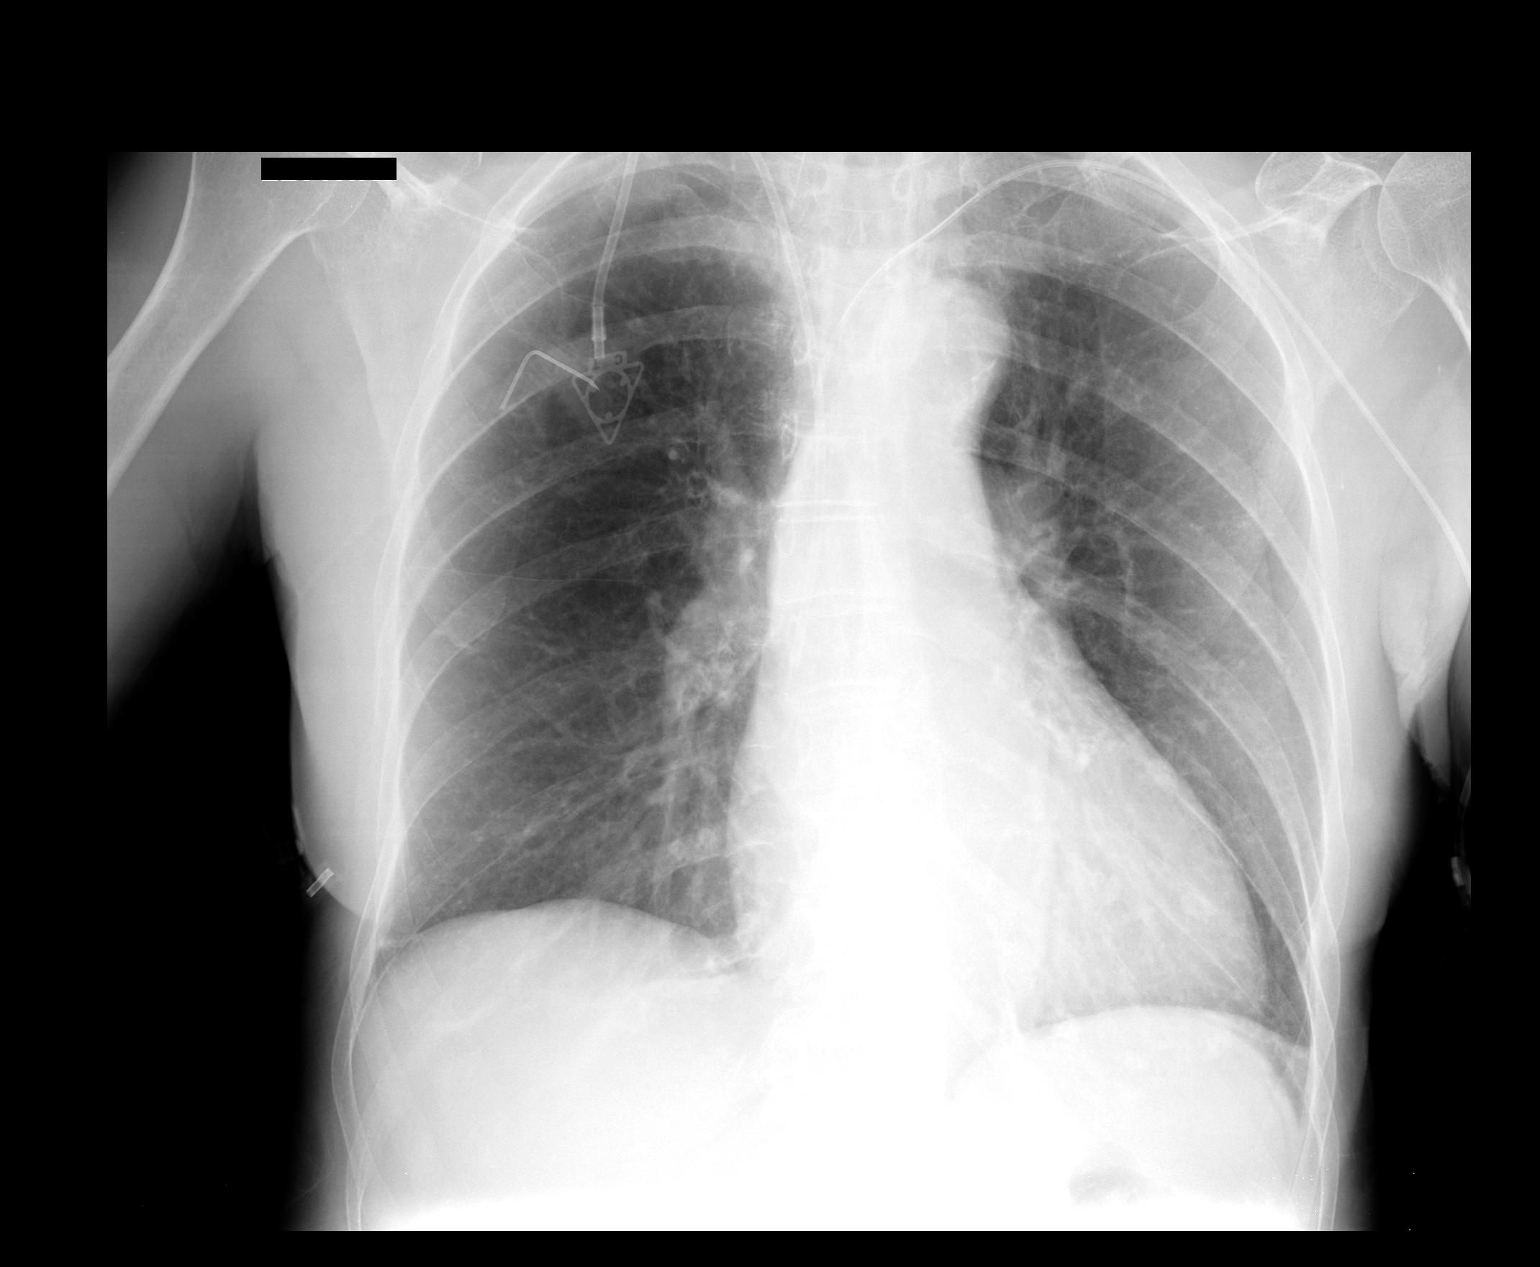

[1 of 1 positions shown; findings below may reference images not displayed]

FINDINGS: The patient has right-sided Port-A-Cath.  Tip overlies
the level of the SVC - right atrial junction.  New left PICC line
is in place with tip overlies the level of the superior vena cava.
No evidence for pneumothorax.

The heart is enlarged.  No evidence for pulmonary edema.  There is
mild prominence of interstitial markings.  Surgical clips are seen
within the right upper quadrant of the abdomen.
IMPRESSION: 1.  Interval placement of left PICC line.  Tip to the superior vena
cava.
2.  Cardiomegaly.

## 2012-07-30 ENCOUNTER — Other Ambulatory Visit: Payer: Self-pay | Admitting: Oncology

## 2012-09-12 ENCOUNTER — Other Ambulatory Visit (HOSPITAL_BASED_OUTPATIENT_CLINIC_OR_DEPARTMENT_OTHER): Payer: Medicare Other | Admitting: Lab

## 2012-09-12 ENCOUNTER — Ambulatory Visit (HOSPITAL_COMMUNITY)
Admission: RE | Admit: 2012-09-12 | Discharge: 2012-09-12 | Disposition: A | Discharge status: Home / Self Care | Payer: Medicare Other | Source: Ambulatory Visit | Attending: Oncology | Admitting: Oncology

## 2012-09-12 ENCOUNTER — Encounter (HOSPITAL_COMMUNITY): Payer: Self-pay

## 2012-09-12 DIAGNOSIS — C8589 Other specified types of non-Hodgkin lymphoma, extranodal and solid organ sites: Secondary | ICD-10-CM | POA: Insufficient documentation

## 2012-09-12 DIAGNOSIS — C8299 Follicular lymphoma, unspecified, extranodal and solid organ sites: Secondary | ICD-10-CM

## 2012-09-12 DIAGNOSIS — C8290 Follicular lymphoma, unspecified, unspecified site: Secondary | ICD-10-CM

## 2012-09-12 DIAGNOSIS — K449 Diaphragmatic hernia without obstruction or gangrene: Secondary | ICD-10-CM | POA: Insufficient documentation

## 2012-09-12 DIAGNOSIS — Z9221 Personal history of antineoplastic chemotherapy: Secondary | ICD-10-CM | POA: Insufficient documentation

## 2012-09-12 DIAGNOSIS — J438 Other emphysema: Secondary | ICD-10-CM | POA: Insufficient documentation

## 2012-09-12 DIAGNOSIS — R0602 Shortness of breath: Secondary | ICD-10-CM | POA: Insufficient documentation

## 2012-09-12 LAB — CBC WITH DIFFERENTIAL/PLATELET
BASO%: 0.5 % (ref 0.0–2.0)
Basophils Absolute: 0 10*3/uL (ref 0.0–0.1)
EOS%: 1.4 % (ref 0.0–7.0)
HGB: 14.3 g/dL (ref 11.6–15.9)
MCH: 31.5 pg (ref 25.1–34.0)
MCHC: 33.5 g/dL (ref 31.5–36.0)
MCV: 94.1 fL (ref 79.5–101.0)
MONO%: 7.9 % (ref 0.0–14.0)
RBC: 4.55 10*6/uL (ref 3.70–5.45)
RDW: 14.2 % (ref 11.2–14.5)
lymph#: 1.5 10*3/uL (ref 0.9–3.3)

## 2012-09-12 LAB — COMPREHENSIVE METABOLIC PANEL (CC13)
BUN: 15.4 mg/dL (ref 7.0–26.0)
CO2: 26 mEq/L (ref 22–29)
Calcium: 10 mg/dL (ref 8.4–10.4)
Chloride: 103 mEq/L (ref 98–107)
Creatinine: 1 mg/dL (ref 0.6–1.1)
Glucose: 100 mg/dl — ABNORMAL HIGH (ref 70–99)

## 2012-09-12 LAB — LACTATE DEHYDROGENASE (CC13): LDH: 226 U/L (ref 125–245)

## 2012-09-12 MED ORDER — IOHEXOL 300 MG/ML  SOLN
100.0000 mL | Freq: Once | INTRAMUSCULAR | Status: AC | PRN
  Administered 2012-09-12: 100 mL via INTRAVENOUS

## 2012-09-19 ENCOUNTER — Encounter: Payer: Self-pay | Admitting: Oncology

## 2012-09-19 ENCOUNTER — Telehealth: Payer: Self-pay | Admitting: Oncology

## 2012-09-19 ENCOUNTER — Other Ambulatory Visit: Payer: Self-pay | Admitting: Cardiovascular Disease

## 2012-09-19 ENCOUNTER — Ambulatory Visit (HOSPITAL_BASED_OUTPATIENT_CLINIC_OR_DEPARTMENT_OTHER): Payer: Medicare Other | Admitting: Oncology

## 2012-09-19 VITALS — BP 182/73 | HR 64 | Temp 97.0°F | Resp 18 | Ht 65.0 in | Wt 163.6 lb

## 2012-09-19 DIAGNOSIS — M810 Age-related osteoporosis without current pathological fracture: Secondary | ICD-10-CM

## 2012-09-19 DIAGNOSIS — I10 Essential (primary) hypertension: Secondary | ICD-10-CM

## 2012-09-19 DIAGNOSIS — C8290 Follicular lymphoma, unspecified, unspecified site: Secondary | ICD-10-CM

## 2012-09-19 DIAGNOSIS — C8299 Follicular lymphoma, unspecified, extranodal and solid organ sites: Secondary | ICD-10-CM

## 2012-09-19 NOTE — Progress Notes (Signed)
[facility]  [phone] [phone]   OFFICE PROGRESS NOTE   Cc:  MCALHANY,CHRISTOPHER, MD  DIAGNOSIS: History of stage III symptomatic non-Hodgkin's lymphoma; centroblastic variant follicular lymphoma; CD20 positive, CD79a positive, CD10 and CD23 faintly positive. Follicular international prognostic index score of 4, which was high risk.   PAST THERAPY: s/p 6 cycles of CHOP-Rituxan (first cycle was without Rituxan). Last cycle given on 10/21/09.  She was on maintenance Rituxan q 2 months x 2 years between 11/2009 and 10/2011.   CURRENT THERAPY: watchful observation.    INTERVAL HISTORY: [name] 72 y.o. female returns for regular follow up with her daughter.  She reports doing well.  She has chronic joint pain.  However, she is able to ambulate around the house.   Patient denies fever, anorexia, weight loss, fatigue, headache, visual changes, confusion, drenching night sweats, palpable lymph node swelling, mucositis, odynophagia, dysphagia, nausea vomiting, jaundice, chest pain, palpitation, shortness of breath, dyspnea on exertion, productive cough, gum bleeding, epistaxis, hematemesis, hemoptysis, abdominal pain, abdominal swelling, early satiety, melena, hematochezia, hematuria, skin rash, spontaneous bleeding, heat or cold intolerance, bowel bladder incontinence, back pain, focal motor weakness, paresthesia, depression.    Past Medical History  Diagnosis Date  . Atherosclerosis of native arteries of the extremities with ulceration(440.23)   . Hypertension   . CAD (coronary artery disease)   . Myocardial infarction   . DVT (deep venous thrombosis)   . Neuromuscular disorder 02/2009  . Osteoporosis 08/17/2011  . Follicular lymphoma dx'd 05/2009    chemo R-CHOP comp 09/2009; maintenance  Rituxan from 11/2009 to 09/2011    Past Surgical History  Procedure Laterality Date  . Coronary angioplasty with stent placement    . Femoral-popliteal  bypass graft  02/15/10    Left CFA to above knee popliteal BPG by Dr. Leonides Sake  . Toe amputation  03/13/10    Left Great toe by Dr. Leonides Sake  . Angioplasty / stenting femoral  08/28/10    Left distal anastomosis, angioplasty w/stent by Dr. Leonides Sake  . Appendectomy    . Tonsillectomy    . Cholecystectomy      Gall bladder  . Femur im nail  04/05/2011    Procedure: INTRAMEDULLARY (IM) NAIL FEMORAL;  Surgeon: Mable Paris, MD;  Location: [facility];  Service: Orthopedics;  Laterality: Left;  Left hip trochanteric nail    Current Outpatient Prescriptions  Medication Sig Dispense Refill  . acetaminophen (TYLENOL) 500 MG tablet Take 500 mg by mouth every 6 (six) hours as needed. Takes for pain      . alendronate (FOSAMAX) 70 MG tablet TAKE 1 TABLET BY MOUTH EVERY 7 DAYS .Marland Kitchen TAKE WITH A FULL GLASS OF WATER ... ON AN EMPTY STOMACH  4 tablet  3  . amLODipine (NORVASC) 5 MG tablet TAKE 1 TABLET BY MOUTH EVERY DAY  30 tablet  6  . aspirin 81 MG tablet Take 81 mg by mouth daily.       . calcium-vitamin D 250-100 MG-UNIT per tablet Take 1 tablet by mouth 2 (two) times daily.      . clopidogrel (PLAVIX) 75 MG tablet TAKE 1 TABLET BY MOUTH DAILY.  30 tablet  11  . CRESTOR 40 MG tablet TAKE 1 TABLET BY MOUTH EVERY DAY  30 tablet  6  . magnesium oxide (MAG-OX) 400 MG tablet TAKE 1 TABLET TWICE A DAY  60 tablet  10  . metoprolol (LOPRESSOR) 50 MG tablet TAKE 1  TABLET BY MOUTH TWICE A DAY  60 tablet  5  . Multiple Vitamins-Minerals (MULTIVITAMIN WITH MINERALS) tablet Take 1 tablet by mouth daily.       . nitroGLYCERIN (NITROSTAT) 0.4 MG SL tablet Place 1 tablet (0.4 mg total) under the tongue every 5 (five) minutes as needed.  25 tablet  5   No current facility-administered medications for this visit.    ALLERGIES:  has No Known Allergies.  REVIEW OF SYSTEMS:  The rest of the 14-point review of system was negative.   Filed Vitals:   09/19/12 1452  BP: 182/73  Pulse: 64  Temp: 97 F  (36.1 C)  Resp: 18   Wt Readings from Last 3 Encounters:  09/19/12 163 lb 9.6 oz (74.208 kg)  05/09/12 158 lb 3.2 oz (71.759 kg)  03/21/12 157 lb 6.4 oz (71.396 kg)   ECOG Performance status: 1 due to DJD.   PHYSICAL EXAMINATION:   General:  well-nourished woman, in no acute distress.  Eyes:  no scleral icterus.  ENT:  There were no oropharyngeal lesions.  Neck was without thyromegaly.  Lymphatics:  Negative cervical, supraclavicular or axillary adenopathy.  Respiratory: lungs were clear bilaterally without wheezing or crackles.  Cardiovascular:  Regular rate and rhythm, S1/S2, without murmur, rub or gallop.  There was no pedal edema.  GI:  abdomen was soft, flat, nontender, nondistended, without organomegaly.  Muscoloskeletal:  no spinal tenderness of palpation of vertebral spine.  Skin exam was without echymosis, petichae.  Neuro exam was nonfocal.  Patient was able to get on and off exam table with some assistance.  Gait was normal.  Patient was alerted and oriented.  Attention was good.   Language was appropriate.  Mood was normal without depression.  Speech was not pressured.  Thought content was not tangential.     LABORATORY/RADIOLOGY DATA:  Lab Results  Component Value Date   WBC 9.4 09/12/2012   HGB 14.3 09/12/2012   HCT 42.8 09/12/2012   PLT 177 09/12/2012   GLUCOSE 100* 09/12/2012   CHOL 119 01/18/2012   TRIG 126.0 01/18/2012   HDL 74.60 01/18/2012   LDLCALC 19 01/18/2012   ALKPHOS 95 09/12/2012   ALT 15 09/12/2012   AST 23 09/12/2012   NA 143 09/12/2012   K 4.3 09/12/2012   CL 103 09/12/2012   CREATININE 1.0 09/12/2012   BUN 15.4 09/12/2012   CO2 26 09/12/2012   INR 0.88 04/04/2011   HGBA1C  Value: 5.7 (NOTE) The ADA recommends the following therapeutic goal for glycemic control related to Hgb A1c measurement: Goal of therapy: <6.5 Hgb A1c  Reference: American Diabetes Association: Clinical Practice Recommendations 2010, Diabetes Care, 2010, 33: (Suppl  1). 03/20/2009    IMAGING:  *RADIOLOGY REPORT*  Clinical Data: Non-Hodgkin's lymphoma  CT CHEST, ABDOMEN AND PELVIS WITH CONTRAST  Technique: Multidetector CT imaging of the chest, abdomen and  pelvis was performed following the standard protocol during bolus  administration of intravenous contrast.  Contrast: OMNIPAQUE IOHEXOL 300 MG/ML SOLN  Comparison: 07/27/2011.  CT CHEST  Findings: No axillary lymphadenopathy. There is no mediastinal or  hilar lymphadenopathy. The wall of the mid and distal esophagus is  slightly prominent in degree of esophageal wall thickening is not  excluded. The patient has a small hiatal hernia. Heart size is  normal. Coronary artery calcification is noted. No pericardial or  pleural effusion.  Lung windows show emphysema. 4 mm subpleural right lower lobe  nodule on image 43 is unchanged. Scattered  areas of ground-glass  attenuation in the left apex are stable. No focal airspace  consolidation.  Bone windows reveal no worrisome lytic or sclerotic osseous  lesions.  IMPRESSION:  Stable exam. No new or progressive disease.  Patchy small areas of ground-glass attenuation in the left apex are  stable, and likely reflects scarring, but continued attention on  follow-up imaging recommended.  Small hiatal hernia  CT ABDOMEN AND PELVIS  Findings: No focal abnormality is seen in the liver or spleen.  Common duct measures 11 mm in diameter which is stable in the  interval and likely related to the cholecystectomy. The duodenum,  pancreas, and abdominal bowel loops are unremarkable. The  gallbladder is surgically absent. Tiny nodule in the right adrenal  gland is unchanged and probably represents a benign adrenal  adenoma. There is some mild thickening of the left adrenal gland,  stable. Tiny low-density lesions in the cortex of each kidney are  too small to characterize, but stable, and likely represents a  benign cortical cyst.  Stable appearance of a small lymph  nodes in the central mesentery.  No abdominal aortic aneurysm although dense atherosclerotic  calcification is noted in the wall of the abdominal aorta. There is  abnormal soft tissue around the distal aorta with some lymphoid  tissue seen in the left para-aortic space infrarenally. This  appearance is unchanged in the interval and remains compatible with  a site of previous disease.  Imaging through the pelvis shows no free intraperitoneal fluid.  There is no pelvic sidewall lymphadenopathy. Bladder is  unremarkable. Uterus is stable appearance. There is no adnexal  mass. The terminal ileum is normal. The appendix is not  visualized, but there is no edema or inflammation in the region of  the cecum.  Bone windows reveal no worrisome lytic or sclerotic osseous  lesions.  IMPRESSION:  Stable. No new or progressive findings in the abdomen or pelvis.  The lymphoid tissue in the retroperitoneal space and central  mesentery is stable, consistent with treated disease.  Original Report Authenticated By: Kennith Center, M.D.   ASSESSMENT AND PLAN:   1. History of lymphoma: She continues to be in remission.  After this CT scan, we will follow her clinically without routine surveillance CT. Patient agrees to this plan. 2. Hypertension: She is on metoprolol and amlodipine.  3. Hx of arterial insufficiency s/p stent and bypass: she is on aspirin, plavix, rosuvastatin, beta blocker.  4. Osteoporosis:  She is on fosamax 70mg  PO once weekly, Calcium/Vit D.  5. Primary care:  I advised her to again find a PCP to arrange for screening colonoscopy, and Pap smear among other primary care issues.  6. Follow up in 6 months.     The length of time of the face-to-face encounter was 15  minutes. More than 50% of time was spent counseling and coordination of care.

## 2012-09-26 ENCOUNTER — Encounter (INDEPENDENT_AMBULATORY_CARE_PROVIDER_SITE_OTHER): Payer: Medicare Other

## 2012-09-26 DIAGNOSIS — R0989 Other specified symptoms and signs involving the circulatory and respiratory systems: Secondary | ICD-10-CM

## 2012-09-26 DIAGNOSIS — I6529 Occlusion and stenosis of unspecified carotid artery: Secondary | ICD-10-CM

## 2012-11-14 ENCOUNTER — Ambulatory Visit: Payer: Medicare Other | Admitting: Neurosurgery

## 2012-11-14 ENCOUNTER — Encounter (INDEPENDENT_AMBULATORY_CARE_PROVIDER_SITE_OTHER): Payer: Medicare Other | Admitting: *Deleted

## 2012-11-14 DIAGNOSIS — Z48812 Encounter for surgical aftercare following surgery on the circulatory system: Secondary | ICD-10-CM

## 2012-11-14 DIAGNOSIS — I739 Peripheral vascular disease, unspecified: Secondary | ICD-10-CM

## 2012-11-17 ENCOUNTER — Other Ambulatory Visit: Payer: Self-pay

## 2012-11-17 DIAGNOSIS — I739 Peripheral vascular disease, unspecified: Secondary | ICD-10-CM

## 2012-11-17 DIAGNOSIS — Z48812 Encounter for surgical aftercare following surgery on the circulatory system: Secondary | ICD-10-CM

## 2012-11-19 ENCOUNTER — Encounter: Payer: Self-pay | Admitting: Surgery

## 2012-11-19 ENCOUNTER — Other Ambulatory Visit: Payer: Self-pay | Admitting: Oncology

## 2012-11-19 ENCOUNTER — Other Ambulatory Visit: Payer: Self-pay | Admitting: Cardiovascular Disease

## 2012-11-24 ENCOUNTER — Other Ambulatory Visit: Payer: Self-pay

## 2012-11-24 MED ORDER — ALENDRONATE SODIUM 70 MG PO TABS: ORAL_TABLET | ORAL | Status: DC

## 2012-11-30 ENCOUNTER — Other Ambulatory Visit: Payer: Self-pay | Admitting: Cardiovascular Disease

## 2012-12-17 ENCOUNTER — Other Ambulatory Visit: Payer: Self-pay | Admitting: Cardiovascular Disease

## 2013-01-19 ENCOUNTER — Other Ambulatory Visit: Payer: Self-pay | Admitting: Cardiovascular Disease

## 2013-02-19 ENCOUNTER — Encounter: Payer: Self-pay | Admitting: Cardiovascular Disease

## 2013-02-19 ENCOUNTER — Ambulatory Visit (INDEPENDENT_AMBULATORY_CARE_PROVIDER_SITE_OTHER): Payer: Medicare Other | Admitting: Cardiovascular Disease

## 2013-02-19 VITALS — BP 150/86 | HR 61 | Ht 65.0 in | Wt 159.0 lb

## 2013-02-19 DIAGNOSIS — I739 Peripheral vascular disease, unspecified: Secondary | ICD-10-CM

## 2013-02-19 DIAGNOSIS — E785 Hyperlipidemia, unspecified: Secondary | ICD-10-CM

## 2013-02-19 DIAGNOSIS — I350 Nonrheumatic aortic (valve) stenosis: Secondary | ICD-10-CM

## 2013-02-19 DIAGNOSIS — I2510 Atherosclerotic heart disease of native coronary artery without angina pectoris: Secondary | ICD-10-CM

## 2013-02-19 DIAGNOSIS — I359 Nonrheumatic aortic valve disorder, unspecified: Secondary | ICD-10-CM

## 2013-02-19 DIAGNOSIS — I10 Essential (primary) hypertension: Secondary | ICD-10-CM

## 2013-02-19 MED ORDER — AMLODIPINE BESYLATE 10 MG PO TABS: 10.0000 mg | ORAL_TABLET | Freq: Every day | ORAL | Status: DC

## 2013-02-19 NOTE — Progress Notes (Signed)
History of Present Illness: 72 yo WF with history of Non-Hodgkins lymphoma, tobacco abuse, HTN, hyperlipidemia, PAD and CAD here today for follow up. She was admitted to [facility] with inferior STEMI 03/20/09 at which time she was found to have a total occlusion of the proximal RCA at the area of a prior stent. A drug eluting stent was deployed in the proximal RCA. Her LV gram and echo showed basal to mid inferior and posterior wall hypokinesis with overall preserved EF. During admission she complained of bilateral lower ext pain with ambulation and weakness with ambulation. We ordered lower extremity arterial dopplers that showed total occlusion of both SFA at the origin with ABI of 0.60 bilaterally. There was also high grade right common femoral artery stenosis. She was noted to have uncontrolled HTN and unequal blood pressures in her arms. A CT angiogram of the chest/neck/abdomen was ordered. This showed stenosis of the left subclavian artery, mildly dilated aortic root, mild disease in bilateral renal arteries. There is diffuse vascular disease including the mesenteric vessels. Unfortunately, the CT scan also showed a right paraspinal soft tissue mass as well as diffuse retroperitoneal and mesenteric adenopathy. She was then diagnosed with Stage III Non-Hodgkins lymphoma and is undergoing treatment under the guidance of Dr. Jethro Bolus. She also developed a DVT and was treated with coumadin.  In the Fall of 2011, she hit her left leg and developed an ulcer over the anterior aspect of the left leg, just above the ankle. She was unable to heal this area. Dr. Imogene Burn performed left common femoral artery to left popliteal artery bypass on 02/15/10. She then had her left great toe ampuated on 03/14/10 by Dr. Imogene Burn. In April she had placement of a stent in the distal portion of the bypass at the anastamosis with the popliteal artery per Dr. Imogene Burn. Dr. Imogene Burn is following her vascular disease. Recent non-invasive studies April 2013  with patent bypass graft in left leg in [facility] office with ABI of 0.67 on the right and 1.04 on the left. She is finishing up her chemotherapy. Her NHL is felt to be in remission. She fell in November 2012 and broke her left hip. Dr. Ave Filter put a pin in her left hip. Echo 9/11 with normal LV function and mild aortic stenosis. Last seen in Oncology April 2014 and she is felt to be in remission. She is off of chemotherapy now.   She is here today for cardiac follow up. She has been feeling great. She has had no chest pain, SOB, palpitations, near syncope or syncope. She has been taking all of her medications.   Primary Care Physician: None  Past Medical History  Diagnosis Date  . Atherosclerosis of native arteries of the extremities with ulceration(440.23)   . Hypertension   . CAD (coronary artery disease)   . Myocardial infarction   . DVT (deep venous thrombosis)   . Neuromuscular disorder 02/2009  . Osteoporosis 08/17/2011  . Follicular lymphoma dx'd 05/2009    chemo R-CHOP comp 09/2009; maintenance  Rituxan from 11/2009 to 09/2011    Past Surgical History  Procedure Laterality Date  . Coronary angioplasty with stent placement    . Femoral-popliteal bypass graft  02/15/10    Left CFA to above knee popliteal BPG by Dr. Leonides Sake  . Toe amputation  03/13/10    Left Great toe by Dr. Leonides Sake  . Angioplasty / stenting femoral  08/28/10    Left distal anastomosis, angioplasty w/stent by Dr. Arlys John  Chen  . Appendectomy    . Tonsillectomy    . Cholecystectomy      Gall bladder  . Femur im nail  04/05/2011    Procedure: INTRAMEDULLARY (IM) NAIL FEMORAL;  Surgeon: Mable Paris, MD;  Location: [facility];  Service: Orthopedics;  Laterality: Left;  Left hip trochanteric nail    Current Outpatient Prescriptions  Medication Sig Dispense Refill  . acetaminophen (TYLENOL) 500 MG tablet Take 500 mg by mouth every 6 (six) hours as needed. Takes for pain      . alendronate (FOSAMAX) 70 MG  tablet TAKE 1 TABLET BY MOUTH EVERY 7 DAYS .Marland Kitchen TAKE WITH A FULL GLASS OF WATER ... ON AN EMPTY STOMACH  4 tablet  3  . amLODipine (NORVASC) 5 MG tablet TAKE 1 TABLET BY MOUTH EVERY DAY  30 tablet  2  . aspirin 81 MG tablet Take 81 mg by mouth daily.       . calcium-vitamin D 250-100 MG-UNIT per tablet Take 1 tablet by mouth 2 (two) times daily.      . clopidogrel (PLAVIX) 75 MG tablet TAKE 1 TABLET BY MOUTH DAILY.  30 tablet  2  . CRESTOR 40 MG tablet TAKE 1 TABLET BY MOUTH EVERY DAY  30 tablet  2  . magnesium oxide (MAG-OX) 400 MG tablet TAKE 1 TABLET TWICE A DAY  60 tablet  10  . metoprolol (LOPRESSOR) 50 MG tablet TAKE 1 TABLET BY MOUTH TWICE A DAY  60 tablet  2  . Multiple Vitamins-Minerals (MULTIVITAMIN WITH MINERALS) tablet Take 1 tablet by mouth daily.       . nitroGLYCERIN (NITROSTAT) 0.4 MG SL tablet Place 1 tablet (0.4 mg total) under the tongue every 5 (five) minutes as needed.  25 tablet  5   No current facility-administered medications for this visit.    No Known Allergies  History   Social History  . Marital Status: Divorced    Spouse Name: N/A    Number of Children: N/A  . Years of Education: N/A   Occupational History  . Not on file.   Social History Main Topics  . Smoking status: Former Smoker    Types: Cigarettes    Quit date: 02/25/2009  . Smokeless tobacco: Never Used  . Alcohol Use: No  . Drug Use: Yes  . Sexual Activity: Not on file   Other Topics Concern  . Not on file   Social History Narrative  . No narrative on file    Family History  Problem Relation Age of Onset  . Heart attack Father   . Cancer Brother     Review of Systems:  As stated in the HPI and otherwise negative.   BP 150/86  Pulse 61  Ht 5\' 5"  (1.651 m)  Wt 159 lb (72.122 kg)  BMI 26.46 kg/m2  Physical Examination: General: Well developed, well nourished, NAD HEENT: OP clear, mucus membranes moist SKIN: warm, dry. No rashes. Neuro: No focal deficits Musculoskeletal:  Muscle strength 5/5 all ext Psychiatric: Mood and affect normal Neck: No JVD, no carotid bruits, no thyromegaly, no lymphadenopathy. Lungs:Clear bilaterally, no wheezes, rhonci, crackles Cardiovascular: Regular rate and rhythm. Systolic  Murmur noted. No gallops or rubs. Abdomen:Soft. Bowel sounds present. Non-tender.  Extremities: No lower extremity edema. Pulses are non-palpable in the bilateral DP/PT.  EKG: NSR, rate 61 bpm. Incomplete RBBB. LAFB. LVH. Non-specific ST lateral leads.   Echo 8/13: Left ventricle: The cavity size was normal. Wall thickness was increased in  a pattern of moderate LVH. The estimated ejection fraction was 55%. - Aortic valve: Sclerosis without stenosis - Atrial septum: No defect or patent foramen ovale was identified.  Assessment and Plan:   1. CAD: Stable. She is on good medical therapy. Will continue ASA and Plavix. BP is well controlled. LV function normal by echo 2013.   2. Aortic stenosis: Mild by echo 2013.  3. PAD: Followed in [facility] by Dr. Imogene Burn  4. HTN: BP elevated. Will increase Norvasc to 10 mg po Qdaily. Continue other meds.   5. Hyperlipidemia: Continue statin. LFTs normal April 2014.

## 2013-02-19 NOTE — Patient Instructions (Signed)
Your physician wants you to follow-up in: 6 months.  You will receive a reminder letter in the mail two months in advance. If you don't receive a letter, please call our office to schedule the follow-up appointment.  Your physician has recommended you make the following change in your medication:  Increase Norvasc to 10 mg by mouth daily  

## 2013-03-04 ENCOUNTER — Other Ambulatory Visit: Payer: Self-pay | Admitting: Cardiovascular Disease

## 2013-03-16 ENCOUNTER — Other Ambulatory Visit: Payer: Self-pay | Admitting: Oncology

## 2013-03-17 ENCOUNTER — Telehealth: Payer: Self-pay | Admitting: Hematology and Oncology

## 2013-03-17 NOTE — Telephone Encounter (Signed)
Moved 10/24 appts to 10/30 lb/NG. lmonvm for pt and mailed schedule.

## 2013-03-18 ENCOUNTER — Telehealth: Payer: Self-pay | Admitting: Hematology and Oncology

## 2013-03-18 NOTE — Telephone Encounter (Signed)
Returned call to dtr Lawson Fiscal re r/s 10/30 appt. Per Jacki Cones she needs a Friday appt after 2pm. Lawson Fiscal given new appt for lb/NG 11/21 @ 2:30pm.

## 2013-03-20 ENCOUNTER — Ambulatory Visit: Payer: Medicare Other

## 2013-03-20 ENCOUNTER — Other Ambulatory Visit: Payer: Medicare Other | Admitting: Lab

## 2013-03-26 ENCOUNTER — Other Ambulatory Visit: Payer: Medicare Other | Admitting: Lab

## 2013-03-26 ENCOUNTER — Other Ambulatory Visit: Payer: Self-pay | Admitting: *Deleted

## 2013-03-26 ENCOUNTER — Ambulatory Visit: Payer: Medicare Other | Admitting: Hematology and Oncology

## 2013-03-26 DIAGNOSIS — C8299 Follicular lymphoma, unspecified, extranodal and solid organ sites: Secondary | ICD-10-CM

## 2013-03-26 MED ORDER — ALENDRONATE SODIUM 70 MG PO TABS: ORAL_TABLET | ORAL | Status: DC

## 2013-04-17 ENCOUNTER — Encounter: Payer: Self-pay | Admitting: Hematology and Oncology

## 2013-04-17 ENCOUNTER — Ambulatory Visit (HOSPITAL_BASED_OUTPATIENT_CLINIC_OR_DEPARTMENT_OTHER): Payer: Medicare Other | Admitting: Hematology and Oncology

## 2013-04-17 ENCOUNTER — Other Ambulatory Visit (HOSPITAL_BASED_OUTPATIENT_CLINIC_OR_DEPARTMENT_OTHER): Payer: Medicare Other | Admitting: Lab

## 2013-04-17 VITALS — BP 158/68 | HR 73 | Temp 97.6°F | Resp 20 | Ht 65.0 in | Wt 162.4 lb

## 2013-04-17 DIAGNOSIS — M899 Disorder of bone, unspecified: Secondary | ICD-10-CM

## 2013-04-17 DIAGNOSIS — I10 Essential (primary) hypertension: Secondary | ICD-10-CM

## 2013-04-17 DIAGNOSIS — C8299 Follicular lymphoma, unspecified, extranodal and solid organ sites: Secondary | ICD-10-CM

## 2013-04-17 DIAGNOSIS — C8290 Follicular lymphoma, unspecified, unspecified site: Secondary | ICD-10-CM

## 2013-04-17 DIAGNOSIS — Z23 Encounter for immunization: Secondary | ICD-10-CM

## 2013-04-17 LAB — COMPREHENSIVE METABOLIC PANEL (CC13)
ALT: 16 U/L (ref 0–55)
AST: 23 U/L (ref 5–34)
Albumin: 4.2 g/dL (ref 3.5–5.0)
Alkaline Phosphatase: 99 U/L (ref 40–150)
Potassium: 3.9 mEq/L (ref 3.5–5.1)
Sodium: 145 mEq/L (ref 136–145)
Total Bilirubin: 0.31 mg/dL (ref 0.20–1.20)
Total Protein: 7.6 g/dL (ref 6.4–8.3)

## 2013-04-17 LAB — CBC WITH DIFFERENTIAL/PLATELET
Basophils Absolute: 0.1 10*3/uL (ref 0.0–0.1)
EOS%: 1.1 % (ref 0.0–7.0)
HCT: 43.8 % (ref 34.8–46.6)
HGB: 14.4 g/dL (ref 11.6–15.9)
MCH: 30.9 pg (ref 25.1–34.0)
MONO#: 0.6 10*3/uL (ref 0.1–0.9)
NEUT#: 6.5 10*3/uL (ref 1.5–6.5)
RDW: 14.2 % (ref 11.2–14.5)
WBC: 9.2 10*3/uL (ref 3.9–10.3)
lymph#: 2 10*3/uL (ref 0.9–3.3)

## 2013-04-17 MED ORDER — INFLUENZA VAC SPLIT QUAD 0.5 ML IM SUSP
0.5000 mL | Freq: Once | INTRAMUSCULAR | Status: AC
  Administered 2013-04-17: 0.5 mL via INTRAMUSCULAR
  Filled 2013-04-17: qty 0.5

## 2013-04-17 NOTE — Progress Notes (Signed)
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 Follicular international prognostic index score of 4, which was high risk.   PAST THERAPY: s/p 6 cycles of CHOP-Rituxan (first cycle was without Rituxan). Last cycle given on 10/21/09.  She was on maintenance Rituxan q 2 months x 2 years between 11/2009 and 10/2011.   INTERVAL HISTORY: [name] 72 y.o. female returns for further followup. She denies any palpable new lymphadenopathy. She denies any recent fever, chills, night sweats or abnormal weight loss The patient denies any recent signs or symptoms of bleeding such as spontaneous epistaxis, hematuria or hematochezia. The patient is on 2 antiplatelet agents because of history of peripheral vascular disease.  I have reviewed the past medical history, past surgical history, social history and family history with the patient and they are unchanged from previous note.  ALLERGIES:  has No Known Allergies.  MEDICATIONS:  Current Outpatient Prescriptions  Medication Sig Dispense Refill  . acetaminophen (TYLENOL) 500 MG tablet Take 500 mg by mouth every 6 (six) hours as needed. Takes for pain      . alendronate (FOSAMAX) 70 MG tablet TAKE 1 TABLET BY MOUTH EVERY 7 DAYS .Marland Kitchen TAKE WITH A FULL GLASS OF WATER ... ON AN EMPTY STOMACH  4 tablet  0  . amLODipine (NORVASC) 10 MG tablet Take 1 tablet (10 mg total) by mouth daily.  30 tablet  11  . aspirin 81 MG tablet Take 81 mg by mouth daily.       . calcium-vitamin D 250-100 MG-UNIT per tablet Take 1 tablet by mouth 2 (two) times daily.       . clopidogrel (PLAVIX) 75 MG tablet TAKE 1 TABLET BY MOUTH DAILY.  30 tablet  2  . CRESTOR 40 MG tablet TAKE 1 TABLET BY MOUTH EVERY DAY  30 tablet  2  . magnesium oxide (MAG-OX) 400 MG tablet TAKE 1 TABLET TWICE A DAY  60 tablet  10  . metoprolol (LOPRESSOR) 50 MG tablet TAKE 1 TABLET BY MOUTH TWICE A DAY  60 tablet  2  . Multiple Vitamins-Minerals (MULTIVITAMIN WITH MINERALS) tablet Take 1 tablet by mouth daily.       . nitroGLYCERIN (NITROSTAT) 0.4 MG SL tablet Place 1 tablet (0.4 mg total) under the tongue every 5 (five) minutes as needed.  25 tablet  5   No current facility-administered medications for this visit.    REVIEW OF SYSTEMS:   Constitutional: Denies fevers, chills or abnormal weight loss Eyes: Denies blurriness of vision Ears, nose, mouth, throat, and face: Denies mucositis or sore throat Respiratory: Denies cough, dyspnea or wheezes Cardiovascular: Denies palpitation, chest discomfort or lower extremity swelling Gastrointestinal:  Denies nausea, heartburn or change in bowel habits Skin: Denies abnormal skin rashes Lymphatics: Denies new lymphadenopathy or easy bruising Neurological:Denies numbness, tingling or new weaknesses Behavioral/Psych: Mood is stable, no new changes  All other systems were reviewed with the patient and are negative.  PHYSICAL EXAMINATION: ECOG PERFORMANCE STATUS: 0 - Asymptomatic  Filed Vitals:   04/17/13 1455  BP: 158/68  Pulse: 73  Temp: 97.6 F (36.4 C)  Resp: 20   Filed Weights   04/17/13 1455  Weight: 162 lb 6.4 oz (73.664 kg)    GENERAL:alert, no distress and comfortable SKIN: skin color, texture, turgor are normal, no rashes or significant lesions EYES: normal, Conjunctiva are pink and non-injected, sclera clear OROPHARYNX:no exudate, no erythema and lips, buccal mucosa, and tongue normal  NECK: supple, thyroid normal size, non-tender, without nodularity LYMPH:  no palpable lymphadenopathy in the cervical, axillary or  inguinal LUNGS: clear to auscultation and percussion with normal breathing effort HEART: regular rate & rhythm and no murmurs and no lower extremity edema ABDOMEN:abdomen soft, non-tender and normal bowel sounds Musculoskeletal:no cyanosis of digits and no clubbing  NEURO: alert & oriented x 3 with fluent speech, no focal motor/sensory deficits  LABORATORY DATA:  I have reviewed the data as listed    Component Value Date/Time   NA 145 04/17/2013 1429   NA 143 11/23/2011 0912   NA 144 07/27/2011 1420   K 3.9 04/17/2013 1429   K 4.0 11/23/2011 0912   K 4.0 07/27/2011 1420   CL 103 09/12/2012 0926   CL 105 11/23/2011 0912   CL 98 07/27/2011 1420   CO2 25 04/17/2013 1429   CO2 26 11/23/2011 0912   CO2 31 07/27/2011 1420   GLUCOSE 93 04/17/2013 1429   GLUCOSE 100* 09/12/2012 0926   GLUCOSE 79 11/23/2011 0912   GLUCOSE 96 07/27/2011 1420   BUN 15.5 04/17/2013 1429   BUN 20 11/23/2011 0912   BUN 16 07/27/2011 1420   CREATININE 0.9 04/17/2013 1429   CREATININE 0.95 11/23/2011 0912   CREATININE 0.9 07/27/2011 1420   CALCIUM 10.6* 04/17/2013 1429   CALCIUM 9.8 11/23/2011 0912   CALCIUM 9.7 07/27/2011 1420   PROT 7.6 04/17/2013 1429   PROT 7.1 01/18/2012 1026   PROT 7.2 07/27/2011 1420   ALBUMIN 4.2 04/17/2013 1429   ALBUMIN 4.1 01/18/2012 1026   AST 23 04/17/2013 1429   AST 30 01/18/2012 1026   AST 31 07/27/2011 1420   ALT 16 04/17/2013 1429   ALT 19 01/18/2012 1026   ALT 28 07/27/2011 1420   ALKPHOS 99 04/17/2013 1429   ALKPHOS 82 01/18/2012 1026   ALKPHOS 127* 07/27/2011 1420   BILITOT 0.31 04/17/2013 1429   BILITOT 0.3 01/18/2012 1026   BILITOT 0.50 07/27/2011 1420   GFRNONAA 56* 04/11/2011 0428   GFRAA 65* 04/11/2011 0428    No results found for this basename: SPEP,  UPEP,   kappa and lambda light chains    Lab Results  Component Value Date   WBC 9.2 04/17/2013   NEUTROABS 6.5 04/17/2013   HGB 14.4 04/17/2013   HCT 43.8 04/17/2013   MCV 94.1 04/17/2013   PLT 188 04/17/2013      Chemistry       Component Value Date/Time   NA 145 04/17/2013 1429   NA 143 11/23/2011 0912   NA 144 07/27/2011 1420   K 3.9 04/17/2013 1429   K 4.0 11/23/2011 0912   K 4.0 07/27/2011 1420   CL 103 09/12/2012 0926   CL 105 11/23/2011 0912   CL 98 07/27/2011 1420   CO2 25 04/17/2013 1429   CO2 26 11/23/2011 0912   CO2 31 07/27/2011 1420   BUN 15.5 04/17/2013 1429   BUN 20 11/23/2011 0912   BUN 16 07/27/2011 1420   CREATININE 0.9 04/17/2013 1429   CREATININE 0.95 11/23/2011 0912   CREATININE 0.9 07/27/2011 1420      Component Value Date/Time   CALCIUM 10.6* 04/17/2013 1429   CALCIUM 9.8 11/23/2011  0912   CALCIUM 9.7 07/27/2011 1420   ALKPHOS 99 04/17/2013 1429   ALKPHOS 82 01/18/2012 1026   ALKPHOS 127* 07/27/2011 1420   AST 23 04/17/2013 1429   AST 30 01/18/2012 1026   AST 31 07/27/2011 1420   ALT 16 04/17/2013 1429   ALT 19 01/18/2012 1026   ALT 28 07/27/2011 1420   BILITOT 0.31 04/17/2013 1429   BILITOT 0.3 01/18/2012 1026   BILITOT 0.50 07/27/2011 1420     ASSESSMENT & PLAN:  #1 high-risk follicular lymphoma The patient has no evidence of active disease. We discussed about future followup without imaging study unless there are clinical suspicion of disease recurrence. She agree. I will see her back in 6 months with history, physical examination and blood work. I educated the patient and family members signs and symptoms to watch out for disease recurrence such as new palpable lymphadenopathy, new onset night sweats or unexplained weight loss. #2 osteoporosis She is not taking enough calcium or vitamin D supplements. I recommended she increase vitamin D intake. #3 hypertension This is much improve compared to previous visit. She will continue current medication as adjusted by her primary care provider #4 preventive care She had recent mammogram that was negative. I recommend sunscreen and skin protection. We discussed the importance of preventive care and reviewed the vaccination programs. She does not have any  prior allergic reactions to influenza vaccination. She agrees to proceed with influenza vaccination today and we will administer it today at the clinic.  Orders Placed This Encounter  Procedures  . Comprehensive metabolic panel    Standing Status: Future     Number of Occurrences:      Standing Expiration Date: 04/17/2014  . CBC with Differential    Standing Status: Future     Number of Occurrences:      Standing Expiration Date: 01/07/2014  . Lactate dehydrogenase    Standing Status: Future     Number of Occurrences:      Standing Expiration Date: 04/17/2014   All questions were answered. The patient knows to call the clinic with any problems, questions or concerns. No barriers to learning was detected. I spent 25 minutes counseling the patient face to face. The total time spent in the appointment was 40 minutes and more than 50% was on counseling and review of test results     [facility], Oluwasemilore Bahl, MD 04/17/2013 3:44 PM

## 2013-04-20 ENCOUNTER — Telehealth: Payer: Self-pay | Admitting: Hematology and Oncology

## 2013-04-20 NOTE — Telephone Encounter (Signed)
per 04/17/13 POF appt for labs and OV made for 6 mos.  May cal mailed to pt shh

## 2013-04-21 ENCOUNTER — Other Ambulatory Visit: Payer: Self-pay | Admitting: Cardiovascular Disease

## 2013-05-04 ENCOUNTER — Other Ambulatory Visit: Payer: Self-pay | Admitting: Oncology

## 2013-05-05 ENCOUNTER — Other Ambulatory Visit: Payer: Self-pay | Admitting: *Deleted

## 2013-05-05 DIAGNOSIS — C8299 Follicular lymphoma, unspecified, extranodal and solid organ sites: Secondary | ICD-10-CM

## 2013-05-05 MED ORDER — ALENDRONATE SODIUM 70 MG PO TABS: ORAL_TABLET | ORAL | Status: DC

## 2013-05-29 ENCOUNTER — Telehealth: Payer: Self-pay | Admitting: Hematology and Oncology

## 2013-05-29 NOTE — Telephone Encounter (Signed)
s.w pt called to r/s appt to friday...done

## 2013-06-01 ENCOUNTER — Other Ambulatory Visit: Payer: Self-pay | Admitting: Cardiovascular Disease

## 2013-06-26 ENCOUNTER — Telehealth: Payer: Self-pay | Admitting: Cardiovascular Disease

## 2013-06-26 DIAGNOSIS — E785 Hyperlipidemia, unspecified: Secondary | ICD-10-CM

## 2013-06-26 MED ORDER — ATORVASTATIN CALCIUM 80 MG PO TABS: 80.0000 mg | ORAL_TABLET | Freq: Every day | ORAL | Status: DC

## 2013-06-26 NOTE — Telephone Encounter (Signed)
New problem   [name] need to speak to nurse concerning pt's medication. Please call

## 2013-06-26 NOTE — Telephone Encounter (Signed)
We can switch to Lipitor 80 mg po QHS. cdm

## 2013-06-26 NOTE — Telephone Encounter (Signed)
Spoke with pt's daughter. Pt's copay for Crestor has gone up. Requesting cheaper alternative. Has not been on any other cholesterol medication in the past. Pharmacy is [facility].

## 2013-06-26 NOTE — Telephone Encounter (Signed)
[name] notified. Will send prescription to pharmacy.

## 2013-07-22 ENCOUNTER — Other Ambulatory Visit: Payer: Self-pay | Admitting: Cardiovascular Disease

## 2013-07-31 ENCOUNTER — Encounter: Payer: Self-pay | Admitting: Cardiovascular Disease

## 2013-07-31 ENCOUNTER — Ambulatory Visit (INDEPENDENT_AMBULATORY_CARE_PROVIDER_SITE_OTHER): Payer: Medicare Other | Admitting: Cardiovascular Disease

## 2013-07-31 VITALS — BP 129/83 | HR 61 | Ht 66.0 in | Wt 159.4 lb

## 2013-07-31 DIAGNOSIS — I359 Nonrheumatic aortic valve disorder, unspecified: Secondary | ICD-10-CM

## 2013-07-31 DIAGNOSIS — I2510 Atherosclerotic heart disease of native coronary artery without angina pectoris: Secondary | ICD-10-CM

## 2013-07-31 DIAGNOSIS — I739 Peripheral vascular disease, unspecified: Secondary | ICD-10-CM

## 2013-07-31 DIAGNOSIS — I10 Essential (primary) hypertension: Secondary | ICD-10-CM

## 2013-07-31 DIAGNOSIS — E785 Hyperlipidemia, unspecified: Secondary | ICD-10-CM

## 2013-07-31 DIAGNOSIS — I350 Nonrheumatic aortic (valve) stenosis: Secondary | ICD-10-CM

## 2013-07-31 MED ORDER — AMLODIPINE BESYLATE 10 MG PO TABS: 10.0000 mg | ORAL_TABLET | Freq: Every day | ORAL | Status: DC

## 2013-07-31 NOTE — Progress Notes (Signed)
History of Present Illness: 73 yo WF with history of Non-Hodgkins lymphoma, tobacco abuse, HTN, hyperlipidemia, PAD and CAD here today for follow up. She was admitted to [facility] with inferior STEMI 03/20/09 at which time she was found to have a total occlusion of the proximal RCA at the area of a prior stent. A drug eluting stent was deployed in the proximal RCA. Her LV gram and echo showed basal to mid inferior and posterior wall hypokinesis with overall preserved EF. During admission she complained of bilateral lower ext pain with ambulation and weakness with ambulation. We ordered lower extremity arterial dopplers that showed total occlusion of both SFA at the origin with ABI of 0.60 bilaterally. There was also high grade right common femoral artery stenosis. She was noted to have uncontrolled HTN and unequal blood pressures in her arms. A CT angiogram of the chest/neck/abdomen was ordered. This showed stenosis of the left subclavian artery, mildly dilated aortic root, mild disease in bilateral renal arteries. There is diffuse vascular disease including the mesenteric vessels. Unfortunately, the CT scan also showed a right paraspinal soft tissue mass as well as diffuse retroperitoneal and mesenteric adenopathy. She was then diagnosed with Stage III Non-Hodgkins lymphoma and is undergoing treatment under the guidance of Dr. Sherryl Manges. She also developed a DVT and was treated with coumadin.  In the Fall of 2011, she hit her left leg and developed an ulcer over the anterior aspect of the left leg, just above the ankle. She was unable to heal this area. Dr. Bridgett Larsson performed left common femoral artery to left popliteal artery bypass on 02/15/10. She then had her left great toe ampuated on 03/14/10 by Dr. Bridgett Larsson. In April she had placement of a stent in the distal portion of the bypass at the anastamosis with the popliteal artery per Dr. Bridgett Larsson. Dr. Bridgett Larsson is following her vascular disease. Recently seen in [facility] office 12/14.  She fell in November 2012 and broke her left hip. Dr. Tamera Punt put a pin in her left hip. Echo 9/11 with normal LV function and mild aortic stenosis. She has been followed in Oncology and she is felt to be in remission, last seen there in November 2014. She is off of chemotherapy now.   She is here today for cardiac follow up. She has been feeling great. She has had no chest pain, SOB, palpitations, near syncope or syncope. She has been taking all of her medications. Her legs only hurt when walking long distances.   Primary Care Physician: None  Fasting lipid profile: Lipid Panel     Component Value Date/Time   CHOL 119 01/18/2012 1026   TRIG 126.0 01/18/2012 1026   HDL 74.60 01/18/2012 1026   CHOLHDL 2 01/18/2012 1026   VLDL 25.2 01/18/2012 1026   LDLCALC 19 01/18/2012 1026     Past Medical History  Diagnosis Date  . Atherosclerosis of native arteries of the extremities with ulceration(440.23)   . Hypertension   . CAD (coronary artery disease)   . Myocardial infarction   . DVT (deep venous thrombosis)   . Neuromuscular disorder 02/2009  . Osteoporosis 08/17/2011  . Follicular lymphoma dx'd 05/2009    chemo R-CHOP comp 09/2009; maintenance  Rituxan from 11/2009 to 09/2011    Past Surgical History  Procedure Laterality Date  . Coronary angioplasty with stent placement    . Femoral-popliteal bypass graft  02/15/10    Left CFA to above knee popliteal BPG by Dr. Adele Barthel  . Toe  amputation  03/13/10    Left Great toe by Dr. Adele Barthel  . Angioplasty / stenting femoral  08/28/10    Left distal anastomosis, angioplasty w/stent by Dr. Adele Barthel  . Appendectomy    . Tonsillectomy    . Cholecystectomy      Gall bladder  . Femur im nail  04/05/2011    Procedure: INTRAMEDULLARY (IM) NAIL FEMORAL;  Surgeon: Nita Sells, MD;  Location: [facility];  Service: Orthopedics;  Laterality: Left;  Left hip trochanteric nail    Current Outpatient Prescriptions  Medication Sig Dispense  Refill  . acetaminophen (TYLENOL) 500 MG tablet Take 500 mg by mouth every 6 (six) hours as needed. Takes for pain      . alendronate (FOSAMAX) 70 MG tablet TAKE 1 TABLET BY MOUTH EVERY 7 DAYS .Marland Kitchen TAKE WITH A FULL GLASS OF WATER ... ON AN EMPTY STOMACH  4 tablet  5  . amLODipine (NORVASC) 10 MG tablet Take 1 tablet (10 mg total) by mouth daily.  30 tablet  11  . aspirin 81 MG tablet Take 81 mg by mouth daily.       Marland Kitchen atorvastatin (LIPITOR) 80 MG tablet Take 1 tablet (80 mg total) by mouth daily.  30 tablet  11  . Calcium Carbonate-Vitamin D (CALCIUM + D PO) Take 500 mg by mouth daily.      . cholecalciferol (VITAMIN D) 1000 UNITS tablet Take 1,000 Units by mouth daily.      . clopidogrel (PLAVIX) 75 MG tablet TAKE 1 TABLET BY MOUTH DAILY.  30 tablet  1  . magnesium oxide (MAG-OX) 400 (241.3 MG) MG tablet TAKE 1 TABLET BY MOUTH TWICE A DAY  60 tablet  0  . metoprolol (LOPRESSOR) 50 MG tablet TAKE 1 TABLET BY MOUTH TWICE A DAY  60 tablet  0  . Multiple Vitamins-Minerals (MULTIVITAMIN WITH MINERALS) tablet Take 1 tablet by mouth daily.       . nitroGLYCERIN (NITROSTAT) 0.4 MG SL tablet Place 1 tablet (0.4 mg total) under the tongue every 5 (five) minutes as needed.  25 tablet  5   No current facility-administered medications for this visit.    No Known Allergies  History   Social History  . Marital Status: Divorced    Spouse Name: N/A    Number of Children: N/A  . Years of Education: N/A   Occupational History  . Not on file.   Social History Main Topics  . Smoking status: Former Smoker -- 1.00 packs/day for 50 years    Types: Cigarettes    Quit date: 02/25/2009  . Smokeless tobacco: Never Used  . Alcohol Use: No  . Drug Use: Yes  . Sexual Activity: Not on file   Other Topics Concern  . Not on file   Social History Narrative  . No narrative on file    Family History  Problem Relation Age of Onset  . Heart attack Father   . Cancer Brother     lung ca    Review of  Systems:  As stated in the HPI and otherwise negative.   BP 129/83  Pulse 61  Ht 5\' 6"  (1.676 m)  Wt 159 lb 6.4 oz (72.303 kg)  BMI 25.74 kg/m2  Physical Examination: General: Well developed, well nourished, NAD HEENT: OP clear, mucus membranes moist SKIN: warm, dry. No rashes. Neuro: No focal deficits Musculoskeletal: Muscle strength 5/5 all ext Psychiatric: Mood and affect normal Neck: No JVD, no carotid bruits, no  thyromegaly, no lymphadenopathy. Lungs:Clear bilaterally, no wheezes, rhonci, crackles Cardiovascular: Regular rate and rhythm. Systolic  Murmur noted. No gallops or rubs. Abdomen:Soft. Bowel sounds present. Non-tender.  Extremities: No lower extremity edema. Pulses are non-palpable in the bilateral DP/PT.  Echo 8/13: Left ventricle: The cavity size was normal. Wall thickness was increased in a pattern of moderate LVH. The estimated ejection fraction was 55%. - Aortic valve: Sclerosis without stenosis - Atrial septum: No defect or patent foramen ovale was identified.  Assessment and Plan:   1. CAD: Stable. She is on good medical therapy. Will continue ASA and Plavix. BP is well controlled. LV function normal by echo 2013.   2. Aortic stenosis: Mild by echo 2013. Murmur stable. Repeat echo after next visit in 9/15.  3. PAD: Followed in [facility] clinic by Dr. Bridgett Larsson, last seen in their office 12/14.   4. HTN: BP controlled. Continue Norvasc 10 mg po Qdaily. Continue other meds.   5. Hyperlipidemia: Continue statin. LFTs normal April 2014. Will repeat lipids and LFTs next week.

## 2013-07-31 NOTE — Patient Instructions (Signed)
Your physician wants you to follow-up in: 6 months.   You will receive a reminder letter in the mail two months in advance. If you don't receive a letter, please call our office to schedule the follow-up appointment.  Your physician recommends that you return for fasting lab work in: next couple of weeks--Lipid and Liver profiles

## 2013-08-03 ENCOUNTER — Other Ambulatory Visit: Payer: Self-pay | Admitting: Cardiovascular Disease

## 2013-08-12 ENCOUNTER — Telehealth: Payer: Self-pay | Admitting: Cardiovascular Disease

## 2013-08-12 NOTE — Telephone Encounter (Signed)
New message     Talk to Fraser Din regarding her mother

## 2013-08-12 NOTE — Telephone Encounter (Signed)
Spoke with pt's daughter and she will bring pt in for fasting lipid and liver profiles on August 14, 2013 around 8:30

## 2013-08-14 ENCOUNTER — Other Ambulatory Visit (INDEPENDENT_AMBULATORY_CARE_PROVIDER_SITE_OTHER): Payer: Medicare Other

## 2013-08-14 DIAGNOSIS — I2510 Atherosclerotic heart disease of native coronary artery without angina pectoris: Secondary | ICD-10-CM

## 2013-08-14 LAB — HEPATIC FUNCTION PANEL
ALT: 19 U/L (ref 0–35)
AST: 24 U/L (ref 0–37)
Albumin: 4.3 g/dL (ref 3.5–5.2)
Alkaline Phosphatase: 93 U/L (ref 39–117)
BILIRUBIN TOTAL: 0.4 mg/dL (ref 0.3–1.2)
Bilirubin, Direct: 0 mg/dL (ref 0.0–0.3)
Total Protein: 7.2 g/dL (ref 6.0–8.3)

## 2013-08-14 LAB — LIPID PANEL
CHOL/HDL RATIO: 2
Cholesterol: 115 mg/dL (ref 0–200)
HDL: 59.3 mg/dL (ref >39.00)
LDL Cholesterol: 29 mg/dL (ref 0–99)
Triglycerides: 133 mg/dL (ref 0.0–149.0)
VLDL: 26.6 mg/dL (ref 0.0–40.0)

## 2013-08-18 ENCOUNTER — Telehealth: Payer: Self-pay | Admitting: Cardiovascular Disease

## 2013-08-18 NOTE — Telephone Encounter (Signed)
Spoke with pt and reviewed lipid and liver profile results with her.

## 2013-08-18 NOTE — Telephone Encounter (Signed)
New message ° ° ° ° ° °Returning a nurses call °

## 2013-08-19 ENCOUNTER — Other Ambulatory Visit: Payer: Self-pay | Admitting: Cardiovascular Disease

## 2013-10-03 ENCOUNTER — Other Ambulatory Visit: Payer: Self-pay | Admitting: Cardiovascular Disease

## 2013-10-19 ENCOUNTER — Ambulatory Visit: Payer: Medicare Other | Admitting: Hematology and Oncology

## 2013-10-19 ENCOUNTER — Other Ambulatory Visit: Payer: Medicare Other

## 2013-10-23 ENCOUNTER — Encounter: Payer: Self-pay | Admitting: Hematology and Oncology

## 2013-10-23 ENCOUNTER — Other Ambulatory Visit (HOSPITAL_BASED_OUTPATIENT_CLINIC_OR_DEPARTMENT_OTHER): Payer: Medicare Other

## 2013-10-23 ENCOUNTER — Ambulatory Visit (HOSPITAL_BASED_OUTPATIENT_CLINIC_OR_DEPARTMENT_OTHER): Payer: Medicare Other | Admitting: Hematology and Oncology

## 2013-10-23 VITALS — BP 144/78 | HR 62 | Temp 97.8°F | Resp 18 | Ht 66.0 in | Wt 159.8 lb

## 2013-10-23 DIAGNOSIS — C8299 Follicular lymphoma, unspecified, extranodal and solid organ sites: Secondary | ICD-10-CM

## 2013-10-23 DIAGNOSIS — C8290 Follicular lymphoma, unspecified, unspecified site: Secondary | ICD-10-CM

## 2013-10-23 DIAGNOSIS — M810 Age-related osteoporosis without current pathological fracture: Secondary | ICD-10-CM

## 2013-10-23 LAB — COMPREHENSIVE METABOLIC PANEL (CC13)
ALBUMIN: 3.9 g/dL (ref 3.5–5.0)
ALT: 17 U/L (ref 0–55)
ANION GAP: 13 meq/L — AB (ref 3–11)
AST: 21 U/L (ref 5–34)
Alkaline Phosphatase: 105 U/L (ref 40–150)
BUN: 18.4 mg/dL (ref 7.0–26.0)
CO2: 23 meq/L (ref 22–29)
Calcium: 10.3 mg/dL (ref 8.4–10.4)
Chloride: 107 mEq/L (ref 98–109)
Creatinine: 1 mg/dL (ref 0.6–1.1)
GLUCOSE: 95 mg/dL (ref 70–140)
POTASSIUM: 3.9 meq/L (ref 3.5–5.1)
Sodium: 143 mEq/L (ref 136–145)
TOTAL PROTEIN: 6.6 g/dL (ref 6.4–8.3)
Total Bilirubin: 0.38 mg/dL (ref 0.20–1.20)

## 2013-10-23 LAB — CBC WITH DIFFERENTIAL/PLATELET
BASO%: 0.6 % (ref 0.0–2.0)
Basophils Absolute: 0.1 10*3/uL (ref 0.0–0.1)
EOS%: 1.6 % (ref 0.0–7.0)
Eosinophils Absolute: 0.1 10*3/uL (ref 0.0–0.5)
HCT: 40.5 % (ref 34.8–46.6)
HGB: 13 g/dL (ref 11.6–15.9)
LYMPH%: 27.3 % (ref 14.0–49.7)
MCH: 30.4 pg (ref 25.1–34.0)
MCHC: 32.1 g/dL (ref 31.5–36.0)
MCV: 94.8 fL (ref 79.5–101.0)
MONO#: 0.6 10*3/uL (ref 0.1–0.9)
MONO%: 6.9 % (ref 0.0–14.0)
NEUT#: 5.2 10*3/uL (ref 1.5–6.5)
NEUT%: 63.6 % (ref 38.4–76.8)
Platelets: 194 10*3/uL (ref 145–400)
RBC: 4.27 10*6/uL (ref 3.70–5.45)
RDW: 14.4 % (ref 11.2–14.5)
WBC: 8.2 10*3/uL (ref 3.9–10.3)
lymph#: 2.3 10*3/uL (ref 0.9–3.3)

## 2013-10-23 LAB — LACTATE DEHYDROGENASE: LDH: 176 U/L (ref 94–250)

## 2013-10-25 NOTE — Assessment & Plan Note (Signed)
There is no clinical signs and symptoms to suggest disease recurrence. Continue observation. I will see her back in 6 months with history, physical examination and blood work.

## 2013-10-25 NOTE — Progress Notes (Signed)
[facility] OFFICE PROGRESS NOTE  Patient Care Team: Burnell Blanks, MD as PCP - General (Cardiology) [name], MD as Consulting Physician (Hematology and Oncology) SUMMARY OF ONCOLOGIC HISTORY: History of stage III symptomatic non-Hodgkin's lymphoma; centroblastic variant follicular lymphoma; MW10 positive, CD79a positive, CD10 and CD23 faintly positive. Follicular international prognostic index score of 4, which was high risk.   PAST THERAPY: s/p 6 cycles of CHOP-Rituxan (first cycle was without Rituxan). Last cycle given on 10/21/09.  She was on maintenance Rituxan q 2 months x 2 years between 11/2009 and 10/2011.   INTERVAL HISTORY: Please see below for problem oriented charting. She denies any recent fever, chills, night sweats or abnormal weight loss Denies new lymphadenopathy.  REVIEW OF SYSTEMS:   Constitutional: Denies fevers, chills or abnormal weight loss Eyes: Denies blurriness of vision Ears, nose, mouth, throat, and face: Denies mucositis or sore throat Respiratory: Denies cough, dyspnea or wheezes Cardiovascular: Denies palpitation, chest discomfort or lower extremity swelling Gastrointestinal:  Denies nausea, heartburn or change in bowel habits Skin: Denies abnormal skin rashes Lymphatics: Denies new lymphadenopathy or easy bruising Neurological:Denies numbness, tingling or new weaknesses Behavioral/Psych: Mood is stable, no new changes  All other systems were reviewed with the patient and are negative.  I have reviewed the past medical history, past surgical history, social history and family history with the patient and they are unchanged from previous note.  ALLERGIES:  has No Known Allergies.  MEDICATIONS:  Current Outpatient Prescriptions  Medication Sig Dispense Refill  . acetaminophen (TYLENOL) 500 MG tablet Take 500 mg by mouth every 6 (six) hours as needed. Takes for pain      . alendronate (FOSAMAX) 70 MG tablet TAKE 1 TABLET BY  MOUTH EVERY 7 DAYS .Marland Kitchen TAKE WITH A FULL GLASS OF WATER ... ON AN EMPTY STOMACH  4 tablet  5  . amLODipine (NORVASC) 10 MG tablet Take 1 tablet (10 mg total) by mouth daily.  30 tablet  11  . aspirin 81 MG tablet Take 81 mg by mouth daily.       Marland Kitchen atorvastatin (LIPITOR) 80 MG tablet Take 1 tablet (80 mg total) by mouth daily.  30 tablet  11  . Calcium Carbonate-Vitamin D (CALCIUM + D PO) Take 500 mg by mouth daily.      . cholecalciferol (VITAMIN D) 1000 UNITS tablet Take 1,000 Units by mouth daily.      . clopidogrel (PLAVIX) 75 MG tablet TAKE 1 TABLET BY MOUTH DAILY.  30 tablet  6  . magnesium oxide (MAG-OX) 400 (241.3 MG) MG tablet TAKE 1 TABLET BY MOUTH TWICE A DAY  60 tablet  3  . metoprolol (LOPRESSOR) 50 MG tablet TAKE 1 TABLET BY MOUTH TWICE A DAY  60 tablet  3  . Multiple Vitamins-Minerals (MULTIVITAMIN WITH MINERALS) tablet Take 1 tablet by mouth daily.       . nitroGLYCERIN (NITROSTAT) 0.4 MG SL tablet Place 1 tablet (0.4 mg total) under the tongue every 5 (five) minutes as needed.  25 tablet  5   No current facility-administered medications for this visit.    PHYSICAL EXAMINATION: ECOG PERFORMANCE STATUS: 0 - Asymptomatic  Filed Vitals:   10/23/13 1437  BP: 144/78  Pulse: 62  Temp: 97.8 F (36.6 C)  Resp: 18   Filed Weights   10/23/13 1437  Weight: 159 lb 12.8 oz (72.485 kg)    GENERAL:alert, no distress and comfortable SKIN: skin color, texture, turgor are normal, no rashes or  significant lesions EYES: normal, Conjunctiva are pink and non-injected, sclera clear OROPHARYNX:no exudate, no erythema and lips, buccal mucosa, and tongue normal  NECK: supple, thyroid normal size, non-tender, without nodularity LYMPH:  no palpable lymphadenopathy in the cervical, axillary or inguinal LUNGS: clear to auscultation and percussion with normal breathing effort HEART: regular rate & rhythm and no murmurs and no lower extremity edema ABDOMEN:abdomen soft, non-tender and normal  bowel sounds Musculoskeletal:no cyanosis of digits and no clubbing  NEURO: alert & oriented x 3 with fluent speech, no focal motor/sensory deficits  LABORATORY DATA:  I have reviewed the data as listed    Component Value Date/Time   NA 143 10/23/2013 1418   NA 143 11/23/2011 0912   NA 144 07/27/2011 1420   K 3.9 10/23/2013 1418   K 4.0 11/23/2011 0912   K 4.0 07/27/2011 1420   CL 103 09/12/2012 0926   CL 105 11/23/2011 0912   CL 98 07/27/2011 1420   CO2 23 10/23/2013 1418   CO2 26 11/23/2011 0912   CO2 31 07/27/2011 1420   GLUCOSE 95 10/23/2013 1418   GLUCOSE 100* 09/12/2012 0926   GLUCOSE 79 11/23/2011 0912   GLUCOSE 96 07/27/2011 1420   BUN 18.4 10/23/2013 1418   BUN 20 11/23/2011 0912   BUN 16 07/27/2011 1420   CREATININE 1.0 10/23/2013 1418   CREATININE 0.95 11/23/2011 0912   CREATININE 0.9 07/27/2011 1420   CALCIUM 10.3 10/23/2013 1418   CALCIUM 9.8 11/23/2011 0912   CALCIUM 9.7 07/27/2011 1420   PROT 6.6 10/23/2013 1418   PROT 7.2 08/14/2013 0833   PROT 7.2 07/27/2011 1420   ALBUMIN 3.9 10/23/2013 1418   ALBUMIN 4.3 08/14/2013 0833   AST 21 10/23/2013 1418   AST 24 08/14/2013 0833   AST 31 07/27/2011 1420   ALT 17 10/23/2013 1418   ALT 19 08/14/2013 0833   ALT 28 07/27/2011 1420   ALKPHOS 105 10/23/2013 1418   ALKPHOS 93 08/14/2013 0833   ALKPHOS 127* 07/27/2011 1420   BILITOT 0.38 10/23/2013 1418   BILITOT 0.4 08/14/2013 0833   BILITOT 0.50 07/27/2011 1420   GFRNONAA 56* 04/11/2011 0428   GFRAA 65* 04/11/2011 0428    No results found for this basename: SPEP,  UPEP,   kappa and lambda light chains    Lab Results  Component Value Date   WBC 8.2 10/23/2013   NEUTROABS 5.2 10/23/2013   HGB 13.0 10/23/2013   HCT 40.5 10/23/2013   MCV 94.8 10/23/2013   PLT 194 10/23/2013      Chemistry      Component Value Date/Time   NA 143 10/23/2013 1418   NA 143 11/23/2011 0912   NA 144 07/27/2011 1420   K 3.9 10/23/2013 1418   K 4.0 11/23/2011 0912   K 4.0 07/27/2011 1420   CL 103 09/12/2012 0926   CL 105 11/23/2011 0912    CL 98 07/27/2011 1420   CO2 23 10/23/2013 1418   CO2 26 11/23/2011 0912   CO2 31 07/27/2011 1420   BUN 18.4 10/23/2013 1418   BUN 20 11/23/2011 0912   BUN 16 07/27/2011 1420   CREATININE 1.0 10/23/2013 1418   CREATININE 0.95 11/23/2011 0912   CREATININE 0.9 07/27/2011 1420      Component Value Date/Time   CALCIUM 10.3 10/23/2013 1418   CALCIUM 9.8 11/23/2011 0912   CALCIUM 9.7 07/27/2011 1420   ALKPHOS 105 10/23/2013 1418   ALKPHOS 93 08/14/2013 0833   ALKPHOS 127* 07/27/2011 1420  AST 21 10/23/2013 1418   AST 24 08/14/2013 0833   AST 31 07/27/2011 1420   ALT 17 10/23/2013 1418   ALT 19 08/14/2013 0833   ALT 28 07/27/2011 1420   BILITOT 0.38 10/23/2013 1418   BILITOT 0.4 08/14/2013 0833   BILITOT 0.50 07/27/2011 1420     ASSESSMENT & PLAN:  Follicular lymphoma There is no clinical signs and symptoms to suggest disease recurrence. Continue observation. I will see her back in 6 months with history, physical examination and blood work.  Osteoporosis Continue the same. There reinforced the importance of vitamin D    Orders Placed This Encounter  Procedures  . Lactate dehydrogenase  . CBC with Differential    Standing Status: Future     Number of Occurrences:      Standing Expiration Date: 10/23/2014  . Comprehensive metabolic panel    Standing Status: Future     Number of Occurrences:      Standing Expiration Date: 10/23/2014  . Lactate dehydrogenase    Standing Status: Future     Number of Occurrences:      Standing Expiration Date: 10/23/2014   All questions were answered. The patient knows to call the clinic with any problems, questions or concerns. No barriers to learning was detected. I spent 15 minutes counseling the patient face to face. The total time spent in the appointment was 20 minutes and more than 50% was on counseling and review of test results     [name], MD 10/25/2013 1:47 PM

## 2013-10-25 NOTE — Assessment & Plan Note (Signed)
Continue the same. There reinforced the importance of vitamin D

## 2013-10-26 ENCOUNTER — Telehealth: Payer: Self-pay | Admitting: Hematology and Oncology

## 2013-10-26 NOTE — Telephone Encounter (Signed)
lmonvm advising the pt of her 6 mnth f.u appt in dec.

## 2013-11-16 ENCOUNTER — Other Ambulatory Visit: Payer: Self-pay | Admitting: Hematology and Oncology

## 2013-11-20 ENCOUNTER — Other Ambulatory Visit (HOSPITAL_COMMUNITY): Payer: Medicare Other

## 2013-11-20 ENCOUNTER — Encounter (HOSPITAL_COMMUNITY): Payer: Medicare Other

## 2013-11-20 ENCOUNTER — Ambulatory Visit: Payer: Medicare Other | Admitting: Vascular Surgery

## 2013-11-20 ENCOUNTER — Ambulatory Visit: Payer: Medicare Other | Admitting: Family

## 2013-12-21 ENCOUNTER — Other Ambulatory Visit: Payer: Self-pay | Admitting: Cardiovascular Disease

## 2014-03-02 ENCOUNTER — Other Ambulatory Visit: Payer: Self-pay | Admitting: Cardiovascular Disease

## 2014-03-16 ENCOUNTER — Encounter: Payer: Self-pay | Admitting: *Deleted

## 2014-03-24 ENCOUNTER — Telehealth: Payer: Self-pay | Admitting: Hematology and Oncology

## 2014-03-24 NOTE — Telephone Encounter (Signed)
returned call to dtr lori re r/s appt. lmonvm for lori asking that she call me back and let me know when pt is able to come in for appt.

## 2014-04-21 ENCOUNTER — Other Ambulatory Visit: Payer: Self-pay | Admitting: Cardiovascular Disease

## 2014-04-27 ENCOUNTER — Telehealth: Payer: Self-pay | Admitting: Hematology and Oncology

## 2014-04-27 NOTE — Telephone Encounter (Signed)
returned pt call and r/s appt per pt request...done....pt ok and aware

## 2014-04-28 ENCOUNTER — Other Ambulatory Visit: Payer: Self-pay | Admitting: Cardiovascular Disease

## 2014-04-29 ENCOUNTER — Other Ambulatory Visit: Payer: Medicare Other

## 2014-04-29 ENCOUNTER — Ambulatory Visit: Payer: Medicare Other | Admitting: Hematology and Oncology

## 2014-04-30 ENCOUNTER — Ambulatory Visit (INDEPENDENT_AMBULATORY_CARE_PROVIDER_SITE_OTHER): Payer: Medicare Other | Admitting: Cardiovascular Disease

## 2014-04-30 ENCOUNTER — Encounter: Payer: Self-pay | Admitting: Cardiovascular Disease

## 2014-04-30 VITALS — BP 138/87 | HR 56 | Ht 66.0 in | Wt 158.0 lb

## 2014-04-30 DIAGNOSIS — I739 Peripheral vascular disease, unspecified: Secondary | ICD-10-CM

## 2014-04-30 DIAGNOSIS — I10 Essential (primary) hypertension: Secondary | ICD-10-CM

## 2014-04-30 DIAGNOSIS — E785 Hyperlipidemia, unspecified: Secondary | ICD-10-CM

## 2014-04-30 DIAGNOSIS — I2510 Atherosclerotic heart disease of native coronary artery without angina pectoris: Secondary | ICD-10-CM

## 2014-04-30 DIAGNOSIS — I350 Nonrheumatic aortic (valve) stenosis: Secondary | ICD-10-CM

## 2014-04-30 NOTE — Progress Notes (Signed)
History of Present Illness: 73 yo WF with history of Non-Hodgkins lymphoma, tobacco abuse, HTN, hyperlipidemia, PAD and CAD here today for follow up. She was admitted to [facility] with inferior STEMI 03/20/09 at which time she was found to have a total occlusion of the proximal RCA at the area of a prior stent. A drug eluting stent was deployed in the proximal RCA. Her LV gram and echo showed basal to mid inferior and posterior wall hypokinesis with overall preserved EF. During admission she complained of bilateral lower ext pain with ambulation and weakness with ambulation. We ordered lower extremity arterial dopplers that showed total occlusion of both SFA at the origin with ABI of 0.60 bilaterally. There was also high grade right common femoral artery stenosis. She was noted to have uncontrolled HTN and unequal blood pressures in her arms. A CT angiogram of the chest/neck/abdomen was ordered. This showed stenosis of the left subclavian artery, mildly dilated aortic root, mild disease in bilateral renal arteries. There is diffuse vascular disease including the mesenteric vessels. Unfortunately, the CT scan also showed a right paraspinal soft tissue mass as well as diffuse retroperitoneal and mesenteric adenopathy. She was then diagnosed with Stage III Non-Hodgkins lymphoma and is undergoing treatment under the guidance of Dr. Sherryl Manges. She also developed a DVT and was treated with coumadin.  In the Fall of 2011, she hit her left leg and developed an ulcer over the anterior aspect of the left leg, just above the ankle. She was unable to heal this area. Dr. Bridgett Larsson performed left common femoral artery to left popliteal artery bypass on 02/15/10. She then had her left great toe ampuated on 03/14/10 by Dr. Bridgett Larsson. In April she had placement of a stent in the distal portion of the bypass at the anastamosis with the popliteal artery per Dr. Bridgett Larsson. Dr. Bridgett Larsson is following her vascular disease. Recently seen in [facility] office 12/13.  She fell in November 2012 and broke her left hip. Dr. Tamera Punt put a pin in her left hip. Echo 8/13 with normal LV function and mild aortic stenosis. She has been followed in Oncology and she is felt to be in remission.   She is here today for cardiac follow up. She has been feeling great. She has had no chest pain, SOB, palpitations, near syncope or syncope. She has been taking all of her medications. Her legs only hurt when walking long distances. She does note anxiety during the day.   Primary Care Physician: None  Lipid Panel     Component Value Date/Time   CHOL 115 08/14/2013 0833   TRIG 133.0 08/14/2013 0833   HDL 59.30 08/14/2013 0833   CHOLHDL 2 08/14/2013 0833   VLDL 26.6 08/14/2013 0833   LDLCALC 29 08/14/2013 7035   Past Medical History  Diagnosis Date  . Atherosclerosis of native arteries of the extremities with ulceration(440.23)   . Hypertension   . CAD (coronary artery disease)   . Myocardial infarction   . DVT (deep venous thrombosis)   . Neuromuscular disorder 02/2009  . Osteoporosis 08/17/2011  . Follicular lymphoma dx'd 05/2009    chemo R-CHOP comp 09/2009; maintenance  Rituxan from 11/2009 to 09/2011    Past Surgical History  Procedure Laterality Date  . Coronary angioplasty with stent placement    . Femoral-popliteal bypass graft  02/15/10    Left CFA to above knee popliteal BPG by Dr. Adele Barthel  . Toe amputation  03/13/10    Left Great toe by  Dr. Adele Barthel  . Angioplasty / stenting femoral  08/28/10    Left distal anastomosis, angioplasty w/stent by Dr. Adele Barthel  . Appendectomy    . Tonsillectomy    . Cholecystectomy      Gall bladder  . Femur im nail  04/05/2011    Procedure: INTRAMEDULLARY (IM) NAIL FEMORAL;  Surgeon: Nita Sells, MD;  Location: [facility];  Service: Orthopedics;  Laterality: Left;  Left hip trochanteric nail    Current Outpatient Prescriptions  Medication Sig Dispense Refill  . acetaminophen (TYLENOL) 500 MG tablet Take  500 mg by mouth every 6 (six) hours as needed. Takes for pain    . alendronate (FOSAMAX) 70 MG tablet TAKE 1 TABLET BY MOUTH EVERY 7 DAYS .Marland Kitchen TAKE WITH A FULL GLASS OF WATER ... ON AN EMPTY STOMACH 4 tablet 5  . amLODipine (NORVASC) 10 MG tablet Take 1 tablet (10 mg total) by mouth daily. 30 tablet 11  . aspirin 81 MG tablet Take 81 mg by mouth daily.     Marland Kitchen atorvastatin (LIPITOR) 80 MG tablet Take 1 tablet (80 mg total) by mouth daily. 30 tablet 11  . Calcium Carbonate-Vitamin D (CALCIUM + D PO) Take 500 mg by mouth 2 (two) times daily.     . cholecalciferol (VITAMIN D) 1000 UNITS tablet Take 1,000 Units by mouth daily.    . clopidogrel (PLAVIX) 75 MG tablet TAKE 1 TABLET BY MOUTH DAILY. 30 tablet 6  . magnesium oxide (MAG-OX) 400 (241.3 MG) MG tablet TAKE 1 TABLET BY MOUTH TWICE A DAY 60 tablet 3  . metoprolol (LOPRESSOR) 50 MG tablet TAKE 1 TABLET BY MOUTH TWICE A DAY 60 tablet 3  . Multiple Vitamins-Minerals (MULTIVITAMIN WITH MINERALS) tablet Take 1 tablet by mouth daily.     . nitroGLYCERIN (NITROSTAT) 0.4 MG SL tablet Place 1 tablet (0.4 mg total) under the tongue every 5 (five) minutes as needed. 25 tablet 5   No current facility-administered medications for this visit.    No Known Allergies  History   Social History  . Marital Status: Divorced    Spouse Name: N/A    Number of Children: N/A  . Years of Education: N/A   Occupational History  . Not on file.   Social History Main Topics  . Smoking status: Former Smoker -- 1.00 packs/day for 50 years    Types: Cigarettes    Quit date: 02/25/2009  . Smokeless tobacco: Never Used  . Alcohol Use: No  . Drug Use: Yes  . Sexual Activity: Not on file   Other Topics Concern  . Not on file   Social History Narrative    Family History  Problem Relation Age of Onset  . Heart attack Father   . Cancer Brother     lung ca    Review of Systems:  As stated in the HPI and otherwise negative.   BP 138/87 mmHg  Pulse 56  Ht 5'  6" (1.676 m)  Wt 158 lb (71.668 kg)  BMI 25.51 kg/m2  Physical Examination: General: Well developed, well nourished, NAD HEENT: OP clear, mucus membranes moist SKIN: warm, dry. No rashes. Neuro: No focal deficits Musculoskeletal: Muscle strength 5/5 all ext Psychiatric: Mood and affect normal Neck: No JVD, no carotid bruits, no thyromegaly, no lymphadenopathy. Lungs:Clear bilaterally, no wheezes, rhonci, crackles Cardiovascular: Regular rate and rhythm. Systolic  Murmur noted. No gallops or rubs. Abdomen:Soft. Bowel sounds present. Non-tender.  Extremities: No lower extremity edema. Pulses are non-palpable in  the bilateral DP/PT.  Echo 8/13: Left ventricle: The cavity size was normal. Wall thickness was increased in a pattern of moderate LVH. The estimated ejection fraction was 55%. - Aortic valve: Sclerosis without stenosis - Atrial septum: No defect or patent foramen ovale was Identified.  EKG: Sinus brady, 56 bpm. LVH. Non-specific ST changes.   Assessment and Plan:   1. CAD: No recent chest pain. She is on good medical therapy. Will continue ASA and Plavix, statin and beta blocker. LV function normal by echo 2013. EKG reviewed today. Will repeat echo.   2. Aortic stenosis: Mild by echo 2013. Murmur stable. Repeat echo after the new year.   3. PAD: Followed in [facility] clinic by Dr. Bridgett Larsson, last seen in their office 12/13. She has no pain in her legs.   4. HTN: BP controlled. Continue Norvasc 10 mg po Qdaily. Continue other meds.   5. Hyperlipidemia: Continue statin. LFTs normal march 2015. Lipids well controlled. LDL at goal and reviewed with pt

## 2014-04-30 NOTE — Patient Instructions (Signed)
Your physician wants you to follow-up in:  6 months. You will receive a reminder letter in the mail two months in advance. If you don't receive a letter, please call our office to schedule the follow-up appointment.  Your physician has requested that you have an echocardiogram. Echocardiography is a painless test that uses sound waves to create images of your heart. It provides your doctor with information about the size and shape of your heart and how well your heart's chambers and valves are working. This procedure takes approximately one hour. There are no restrictions for this procedure.

## 2014-05-03 ENCOUNTER — Other Ambulatory Visit: Payer: Self-pay | Admitting: Cardiovascular Disease

## 2014-05-17 ENCOUNTER — Other Ambulatory Visit: Payer: Self-pay | Admitting: Hematology and Oncology

## 2014-05-31 ENCOUNTER — Other Ambulatory Visit: Payer: Self-pay | Admitting: Cardiovascular Disease

## 2014-06-04 ENCOUNTER — Encounter: Payer: Self-pay | Admitting: Hematology and Oncology

## 2014-06-04 ENCOUNTER — Telehealth: Payer: Self-pay | Admitting: Hematology and Oncology

## 2014-06-04 ENCOUNTER — Ambulatory Visit (HOSPITAL_BASED_OUTPATIENT_CLINIC_OR_DEPARTMENT_OTHER): Payer: Medicare Other | Admitting: Hematology and Oncology

## 2014-06-04 ENCOUNTER — Other Ambulatory Visit (HOSPITAL_BASED_OUTPATIENT_CLINIC_OR_DEPARTMENT_OTHER): Payer: Medicare Other

## 2014-06-04 VITALS — BP 121/52 | HR 60 | Temp 97.4°F | Resp 18 | Ht 66.0 in | Wt 158.0 lb

## 2014-06-04 DIAGNOSIS — C8290 Follicular lymphoma, unspecified, unspecified site: Secondary | ICD-10-CM

## 2014-06-04 DIAGNOSIS — I739 Peripheral vascular disease, unspecified: Secondary | ICD-10-CM

## 2014-06-04 LAB — COMPREHENSIVE METABOLIC PANEL (CC13)
ALBUMIN: 4 g/dL (ref 3.5–5.0)
ALT: 16 U/L (ref 0–55)
ANION GAP: 11 meq/L (ref 3–11)
AST: 21 U/L (ref 5–34)
Alkaline Phosphatase: 110 U/L (ref 40–150)
BUN: 17.4 mg/dL (ref 7.0–26.0)
CO2: 30 meq/L — AB (ref 22–29)
Calcium: 10.7 mg/dL — ABNORMAL HIGH (ref 8.4–10.4)
Chloride: 104 mEq/L (ref 98–109)
Creatinine: 1 mg/dL (ref 0.6–1.1)
EGFR: 56 mL/min/{1.73_m2} — ABNORMAL LOW (ref >90)
Glucose: 93 mg/dl (ref 70–140)
POTASSIUM: 4 meq/L (ref 3.5–5.1)
Sodium: 145 mEq/L (ref 136–145)
Total Bilirubin: 0.48 mg/dL (ref 0.20–1.20)
Total Protein: 6.8 g/dL (ref 6.4–8.3)

## 2014-06-04 LAB — CBC WITH DIFFERENTIAL/PLATELET
BASO%: 0.4 % (ref 0.0–2.0)
Basophils Absolute: 0 10*3/uL (ref 0.0–0.1)
EOS ABS: 0.1 10*3/uL (ref 0.0–0.5)
EOS%: 1.5 % (ref 0.0–7.0)
HCT: 43.3 % (ref 34.8–46.6)
HEMOGLOBIN: 13.9 g/dL (ref 11.6–15.9)
LYMPH%: 30.5 % (ref 14.0–49.7)
MCH: 31.1 pg (ref 25.1–34.0)
MCHC: 32.1 g/dL (ref 31.5–36.0)
MCV: 96.9 fL (ref 79.5–101.0)
MONO#: 0.6 10*3/uL (ref 0.1–0.9)
MONO%: 5.9 % (ref 0.0–14.0)
NEUT#: 5.9 10*3/uL (ref 1.5–6.5)
NEUT%: 61.7 % (ref 38.4–76.8)
PLATELETS: 205 10*3/uL (ref 145–400)
RBC: 4.47 10*6/uL (ref 3.70–5.45)
RDW: 14.1 % (ref 11.2–14.5)
WBC: 9.6 10*3/uL (ref 3.9–10.3)
lymph#: 2.9 10*3/uL (ref 0.9–3.3)

## 2014-06-04 LAB — LACTATE DEHYDROGENASE (CC13): LDH: 218 U/L (ref 125–245)

## 2014-06-04 NOTE — Assessment & Plan Note (Signed)
There is no clinical signs and symptoms to suggest disease recurrence. Continue observation. I will see her back in 8 months with history, physical examination and blood work. If her next visit show no evidence of disease, I will just see her on a yearly basis.

## 2014-06-04 NOTE — Progress Notes (Signed)
[place] OFFICE PROGRESS NOTE  Patient Care Team: Burnell Blanks, MD as PCP - General (Cardiology) [name], MD as Consulting Physician (Hematology and Oncology) SUMMARY OF ONCOLOGIC HISTORY: History of stage III symptomatic non-Hodgkin's lymphoma; centroblastic variant follicular lymphoma; WN46 positive, CD79a positive, CD10 and CD23 faintly positive. Follicular international prognostic index score of 4, which was high risk.   PAST THERAPY: s/p 6 cycles of CHOP-Rituxan (first cycle was without Rituxan). Last cycle given on 10/21/09.  She was on maintenance Rituxan q 2 months x 2 years between 11/2009 and 10/2011.   INTERVAL HISTORY: Please see below for problem oriented charting. She feels well. Denies recent infection. No new lymphadenopathy.  REVIEW OF SYSTEMS:   Constitutional: Denies fevers, chills or abnormal weight loss Eyes: Denies blurriness of vision Ears, nose, mouth, throat, and face: Denies mucositis or sore throat Respiratory: Denies cough, dyspnea or wheezes Cardiovascular: Denies palpitation, chest discomfort or lower extremity swelling Gastrointestinal:  Denies nausea, heartburn or change in bowel habits Skin: Denies abnormal skin rashes Lymphatics: Denies new lymphadenopathy or easy bruising Neurological:Denies numbness, tingling or new weaknesses Behavioral/Psych: Mood is stable, no new changes  All other systems were reviewed with the patient and are negative.  I have reviewed the past medical history, past surgical history, social history and family history with the patient and they are unchanged from previous note.  ALLERGIES:  has No Known Allergies.  MEDICATIONS:  Current Outpatient Prescriptions  Medication Sig Dispense Refill  . acetaminophen (TYLENOL) 500 MG tablet Take 500 mg by mouth every 6 (six) hours as needed. Takes for pain    . alendronate (FOSAMAX) 70 MG tablet TAKE 1 TABLET BY MOUTH EVERY 7 DAYS .Marland Kitchen TAKE WITH A FULL GLASS  OF WATER ... ON AN EMPTY STOMACH 4 tablet 5  . amLODipine (NORVASC) 10 MG tablet TAKE 1 TABLET BY MOUTH EVERY DAY 30 tablet 5  . aspirin 81 MG tablet Take 81 mg by mouth daily.     Marland Kitchen atorvastatin (LIPITOR) 80 MG tablet Take 1 tablet (80 mg total) by mouth daily. 30 tablet 11  . Calcium Carbonate-Vitamin D (CALCIUM + D PO) Take 500 mg by mouth 2 (two) times daily.     . cholecalciferol (VITAMIN D) 1000 UNITS tablet Take 1,000 Units by mouth daily.    . clopidogrel (PLAVIX) 75 MG tablet TAKE 1 TABLET BY MOUTH DAILY. 30 tablet 6  . magnesium oxide (MAG-OX) 400 (241.3 MG) MG tablet TAKE 1 TABLET BY MOUTH TWICE A DAY 60 tablet 3  . metoprolol (LOPRESSOR) 50 MG tablet TAKE 1 TABLET BY MOUTH TWICE A DAY 60 tablet 3  . Multiple Vitamins-Minerals (MULTIVITAMIN WITH MINERALS) tablet Take 1 tablet by mouth daily.     . nitroGLYCERIN (NITROSTAT) 0.4 MG SL tablet Place 1 tablet (0.4 mg total) under the tongue every 5 (five) minutes as needed. 25 tablet 5   No current facility-administered medications for this visit.    PHYSICAL EXAMINATION: ECOG PERFORMANCE STATUS: 0 - Asymptomatic  Filed Vitals:   06/04/14 1510  BP: 121/52  Pulse: 60  Temp: 97.4 F (36.3 C)  Resp: 18   Filed Weights   06/04/14 1510  Weight: 158 lb (71.668 kg)    GENERAL:alert, no distress and comfortable SKIN: skin color, texture, turgor are normal, no rashes or significant lesions EYES: normal, Conjunctiva are pink and non-injected, sclera clear OROPHARYNX:no exudate, no erythema and lips, buccal mucosa, and tongue normal  NECK: supple, thyroid normal size, non-tender, without  nodularity LYMPH:  no palpable lymphadenopathy in the cervical, axillary or inguinal LUNGS: clear to auscultation and percussion with normal breathing effort HEART: regular rate & rhythm and no murmurs and no lower extremity edema ABDOMEN:abdomen soft, non-tender and normal bowel sounds Musculoskeletal:no cyanosis of digits and no clubbing  NEURO:  alert & oriented x 3 with fluent speech, no focal motor/sensory deficits  LABORATORY DATA:  I have reviewed the data as listed    Component Value Date/Time   NA 143 10/23/2013 1418   NA 143 11/23/2011 0912   NA 144 07/27/2011 1420   K 3.9 10/23/2013 1418   K 4.0 11/23/2011 0912   K 4.0 07/27/2011 1420   CL 103 09/12/2012 0926   CL 105 11/23/2011 0912   CL 98 07/27/2011 1420   CO2 23 10/23/2013 1418   CO2 26 11/23/2011 0912   CO2 31 07/27/2011 1420   GLUCOSE 95 10/23/2013 1418   GLUCOSE 100* 09/12/2012 0926   GLUCOSE 79 11/23/2011 0912   GLUCOSE 96 07/27/2011 1420   BUN 18.4 10/23/2013 1418   BUN 20 11/23/2011 0912   BUN 16 07/27/2011 1420   CREATININE 1.0 10/23/2013 1418   CREATININE 0.95 11/23/2011 0912   CREATININE 0.9 07/27/2011 1420   CALCIUM 10.3 10/23/2013 1418   CALCIUM 9.8 11/23/2011 0912   CALCIUM 9.7 07/27/2011 1420   PROT 6.6 10/23/2013 1418   PROT 7.2 08/14/2013 0833   PROT 7.2 07/27/2011 1420   ALBUMIN 3.9 10/23/2013 1418   ALBUMIN 4.3 08/14/2013 0833   AST 21 10/23/2013 1418   AST 24 08/14/2013 0833   AST 31 07/27/2011 1420   ALT 17 10/23/2013 1418   ALT 19 08/14/2013 0833   ALT 28 07/27/2011 1420   ALKPHOS 105 10/23/2013 1418   ALKPHOS 93 08/14/2013 0833   ALKPHOS 127* 07/27/2011 1420   BILITOT 0.38 10/23/2013 1418   BILITOT 0.4 08/14/2013 0833   BILITOT 0.50 07/27/2011 1420   GFRNONAA 56* 04/11/2011 0428   GFRAA 65* 04/11/2011 0428    No results found for: SPEP, UPEP  Lab Results  Component Value Date   WBC 9.6 06/04/2014   NEUTROABS 5.9 06/04/2014   HGB 13.9 06/04/2014   HCT 43.3 06/04/2014   MCV 96.9 06/04/2014   PLT 205 06/04/2014      Chemistry      Component Value Date/Time   NA 143 10/23/2013 1418   NA 143 11/23/2011 0912   NA 144 07/27/2011 1420   K 3.9 10/23/2013 1418   K 4.0 11/23/2011 0912   K 4.0 07/27/2011 1420   CL 103 09/12/2012 0926   CL 105 11/23/2011 0912   CL 98 07/27/2011 1420   CO2 23 10/23/2013 1418    CO2 26 11/23/2011 0912   CO2 31 07/27/2011 1420   BUN 18.4 10/23/2013 1418   BUN 20 11/23/2011 0912   BUN 16 07/27/2011 1420   CREATININE 1.0 10/23/2013 1418   CREATININE 0.95 11/23/2011 0912   CREATININE 0.9 07/27/2011 1420      Component Value Date/Time   CALCIUM 10.3 10/23/2013 1418   CALCIUM 9.8 11/23/2011 0912   CALCIUM 9.7 07/27/2011 1420   ALKPHOS 105 10/23/2013 1418   ALKPHOS 93 08/14/2013 0833   ALKPHOS 127* 07/27/2011 1420   AST 21 10/23/2013 1418   AST 24 08/14/2013 0833   AST 31 07/27/2011 1420   ALT 17 10/23/2013 1418   ALT 19 08/14/2013 0833   ALT 28 07/27/2011 1420   BILITOT 0.38 10/23/2013 1418  BILITOT 0.4 08/14/2013 0833   BILITOT 0.50 07/27/2011 1420     ASSESSMENT & PLAN:  Follicular lymphoma There is no clinical signs and symptoms to suggest disease recurrence. Continue observation. I will see her back in 8 months with history, physical examination and blood work. If her next visit show no evidence of disease, I will just see her on a yearly basis.   PAD (peripheral artery disease) She has significant peripheral vascular disease and is on maximal medical management. I would defer to her cardiologist for further management.   Orders Placed This Encounter  Procedures  . CBC with Differential    Standing Status: Future     Number of Occurrences:      Standing Expiration Date: 07/09/2015  . Comprehensive metabolic panel    Standing Status: Future     Number of Occurrences:      Standing Expiration Date: 07/09/2015  . Lactate dehydrogenase    Standing Status: Future     Number of Occurrences:      Standing Expiration Date: 07/09/2015   All questions were answered. The patient knows to call the clinic with any problems, questions or concerns. No barriers to learning was detected. I spent 15 minutes counseling the patient face to face. The total time spent in the appointment was 20 minutes and more than 50% was on counseling and review of test  results     [facility], [place], MD 06/04/2014 3:21 PM

## 2014-06-04 NOTE — Assessment & Plan Note (Signed)
She has significant peripheral vascular disease and is on maximal medical management. I would defer to her cardiologist for further management.

## 2014-06-04 NOTE — Telephone Encounter (Signed)
LM to confirm appt for Sept. Mailed cal.

## 2014-06-23 ENCOUNTER — Other Ambulatory Visit: Payer: Self-pay | Admitting: Cardiovascular Disease

## 2014-07-30 ENCOUNTER — Ambulatory Visit (HOSPITAL_COMMUNITY): Payer: Medicare Other | Attending: Cardiovascular Disease | Admitting: Radiology

## 2014-07-30 DIAGNOSIS — E785 Hyperlipidemia, unspecified: Secondary | ICD-10-CM | POA: Insufficient documentation

## 2014-07-30 DIAGNOSIS — I2510 Atherosclerotic heart disease of native coronary artery without angina pectoris: Secondary | ICD-10-CM

## 2014-07-30 DIAGNOSIS — I350 Nonrheumatic aortic (valve) stenosis: Secondary | ICD-10-CM | POA: Insufficient documentation

## 2014-07-30 DIAGNOSIS — Z720 Tobacco use: Secondary | ICD-10-CM | POA: Insufficient documentation

## 2014-07-30 DIAGNOSIS — I10 Essential (primary) hypertension: Secondary | ICD-10-CM | POA: Diagnosis not present

## 2014-07-30 NOTE — Progress Notes (Signed)
Echocardiogram performed.  

## 2014-08-04 ENCOUNTER — Telehealth: Payer: Self-pay | Admitting: Cardiovascular Disease

## 2014-08-04 NOTE — Telephone Encounter (Signed)
Left message to call back to discuss concerns.  Appears [name] called her yesterday to review her echo results.

## 2014-08-04 NOTE — Telephone Encounter (Signed)
New message     patient calling back to speak with nurse from yesterday.

## 2014-08-05 NOTE — Telephone Encounter (Signed)
Pt has been notified of echo results.

## 2014-08-18 ENCOUNTER — Other Ambulatory Visit: Payer: Self-pay | Admitting: Cardiovascular Disease

## 2014-11-13 ENCOUNTER — Other Ambulatory Visit: Payer: Self-pay | Admitting: Hematology and Oncology

## 2014-11-18 ENCOUNTER — Other Ambulatory Visit: Payer: Self-pay | Admitting: Hematology and Oncology

## 2014-11-19 ENCOUNTER — Telehealth: Payer: Self-pay | Admitting: *Deleted

## 2014-11-19 DIAGNOSIS — M810 Age-related osteoporosis without current pathological fracture: Secondary | ICD-10-CM

## 2014-11-19 NOTE — Telephone Encounter (Signed)
She needs a DEXA scan first I cannot find any records of recent DEXA  Please order before refill

## 2014-11-19 NOTE — Telephone Encounter (Signed)
Pt needs refill on Fosamax today.  States takes weekly on Sundays.  Her next appt w/ Dr. Alvy Bimler is 02/04/15.

## 2014-11-19 NOTE — Telephone Encounter (Signed)
Informed daughter pt needs to have Bone density scan done before refilling fosamax.  Last scan done was in 2013 at [place].   Daughter requests order goes to [place] again and they call her home number to make appt..   Faxed order to [facility] at [phone].

## 2014-11-26 ENCOUNTER — Other Ambulatory Visit: Payer: Self-pay | Admitting: Cardiology

## 2014-11-26 ENCOUNTER — Other Ambulatory Visit: Payer: Self-pay | Admitting: Cardiovascular Disease

## 2014-12-20 ENCOUNTER — Other Ambulatory Visit: Payer: Self-pay | Admitting: Cardiovascular Disease

## 2014-12-27 ENCOUNTER — Other Ambulatory Visit: Payer: Self-pay | Admitting: Cardiovascular Disease

## 2015-01-15 ENCOUNTER — Other Ambulatory Visit: Payer: Self-pay | Admitting: Cardiovascular Disease

## 2015-01-27 ENCOUNTER — Other Ambulatory Visit: Payer: Self-pay | Admitting: Cardiovascular Disease

## 2015-02-02 ENCOUNTER — Telehealth: Payer: Self-pay | Admitting: Hematology and Oncology

## 2015-02-02 NOTE — Telephone Encounter (Signed)
pt called to r/s...done....pt ok and aware

## 2015-02-04 ENCOUNTER — Other Ambulatory Visit: Payer: Medicare Other

## 2015-02-04 ENCOUNTER — Ambulatory Visit: Payer: Medicare Other | Admitting: Hematology and Oncology

## 2015-02-26 ENCOUNTER — Other Ambulatory Visit: Payer: Self-pay | Admitting: Cardiovascular Disease

## 2015-03-15 ENCOUNTER — Telehealth: Payer: Self-pay | Admitting: Cardiovascular Disease

## 2015-03-15 MED ORDER — CLOPIDOGREL BISULFATE 75 MG PO TABS: 75.0000 mg | ORAL_TABLET | Freq: Every day | ORAL | Status: DC

## 2015-03-15 NOTE — Telephone Encounter (Signed)
New message '  Pt daughter wants mother to see Dr.McAlhany and i offered a PA  Pt daughter only want a Friday appt after 2pm and she states she needs RN to know that  She needs refills   I asked her if i can check the other locations she said no

## 2015-03-15 NOTE — Telephone Encounter (Signed)
Spoke with daughter and she needs a Friday after 2 pm secondary to her work schedule Daughter stated if Dr Angelena Form ok's patient seeing a PA/NP she will schedule with them Refilled Plavix and will forward to Fraser Din A RN to see about an appointment Daughter appreciative for call back

## 2015-03-15 NOTE — Telephone Encounter (Signed)
Forwarding to Dr. Camillia Herter nurse.

## 2015-03-16 NOTE — Telephone Encounter (Signed)
Spoke with pt's daughter and appt made for pt to see Dr. Angelena Form on December 2,2016 at 3:30

## 2015-04-08 ENCOUNTER — Encounter: Payer: Self-pay | Admitting: Hematology and Oncology

## 2015-04-08 ENCOUNTER — Ambulatory Visit (HOSPITAL_BASED_OUTPATIENT_CLINIC_OR_DEPARTMENT_OTHER): Payer: Medicare Other | Admitting: Hematology and Oncology

## 2015-04-08 ENCOUNTER — Other Ambulatory Visit (HOSPITAL_BASED_OUTPATIENT_CLINIC_OR_DEPARTMENT_OTHER): Payer: Medicare Other

## 2015-04-08 VITALS — BP 125/70 | HR 78 | Temp 98.5°F | Resp 18 | Ht 66.0 in | Wt 156.6 lb

## 2015-04-08 DIAGNOSIS — Z8572 Personal history of non-Hodgkin lymphomas: Secondary | ICD-10-CM | POA: Diagnosis not present

## 2015-04-08 DIAGNOSIS — I739 Peripheral vascular disease, unspecified: Secondary | ICD-10-CM

## 2015-04-08 DIAGNOSIS — C8290 Follicular lymphoma, unspecified, unspecified site: Secondary | ICD-10-CM

## 2015-04-08 DIAGNOSIS — C8298 Follicular lymphoma, unspecified, lymph nodes of multiple sites: Secondary | ICD-10-CM

## 2015-04-08 LAB — COMPREHENSIVE METABOLIC PANEL (CC13)
ALT: 16 U/L (ref 0–55)
ANION GAP: 12 meq/L — AB (ref 3–11)
AST: 19 U/L (ref 5–34)
Albumin: 3.9 g/dL (ref 3.5–5.0)
Alkaline Phosphatase: 108 U/L (ref 40–150)
BUN: 16.7 mg/dL (ref 7.0–26.0)
CALCIUM: 10.4 mg/dL (ref 8.4–10.4)
CHLORIDE: 107 meq/L (ref 98–109)
CO2: 26 mEq/L (ref 22–29)
Creatinine: 0.9 mg/dL (ref 0.6–1.1)
EGFR: 60 mL/min/{1.73_m2} — AB (ref >90)
Glucose: 96 mg/dl (ref 70–140)
POTASSIUM: 3.9 meq/L (ref 3.5–5.1)
Sodium: 145 mEq/L (ref 136–145)
Total Bilirubin: 0.3 mg/dL (ref 0.20–1.20)
Total Protein: 6.8 g/dL (ref 6.4–8.3)

## 2015-04-08 LAB — CBC WITH DIFFERENTIAL/PLATELET
BASO%: 0.9 % (ref 0.0–2.0)
Basophils Absolute: 0.1 10*3/uL (ref 0.0–0.1)
EOS%: 1.1 % (ref 0.0–7.0)
Eosinophils Absolute: 0.1 10*3/uL (ref 0.0–0.5)
HEMATOCRIT: 41.6 % (ref 34.8–46.6)
HGB: 13.7 g/dL (ref 11.6–15.9)
LYMPH#: 2.6 10*3/uL (ref 0.9–3.3)
LYMPH%: 26.2 % (ref 14.0–49.7)
MCH: 30.9 pg (ref 25.1–34.0)
MCHC: 32.9 g/dL (ref 31.5–36.0)
MCV: 93.9 fL (ref 79.5–101.0)
MONO#: 0.6 10*3/uL (ref 0.1–0.9)
MONO%: 5.6 % (ref 0.0–14.0)
NEUT%: 66.2 % (ref 38.4–76.8)
NEUTROS ABS: 6.7 10*3/uL — AB (ref 1.5–6.5)
Platelets: 197 10*3/uL (ref 145–400)
RBC: 4.43 10*6/uL (ref 3.70–5.45)
RDW: 14.1 % (ref 11.2–14.5)
WBC: 10.1 10*3/uL (ref 3.9–10.3)

## 2015-04-08 LAB — LACTATE DEHYDROGENASE (CC13): LDH: 209 U/L (ref 125–245)

## 2015-04-08 NOTE — Assessment & Plan Note (Signed)
The patient is a long-term cancer survivor. She had no clinical signs of disease. I recommend transitioning her care to cancer survivorship clinic and she agreed

## 2015-04-08 NOTE — Assessment & Plan Note (Signed)
she will continue current medical management. I recommend close follow-up with primary care doctor for medication adjustment.  

## 2015-04-08 NOTE — Progress Notes (Signed)
[place] OFFICE PROGRESS NOTE  Patient Care Team: Burnell Blanks, MD as PCP - General (Cardiology) [name], MD as Consulting Physician (Hematology and Oncology)  SUMMARY OF ONCOLOGIC HISTORY:  History of stage III symptomatic non-Hodgkin's lymphoma; centroblastic variant follicular lymphoma; JJ00 positive, CD79a positive, CD10 and CD23 faintly positive. Follicular international prognostic index score of 4, which was high risk.   PAST THERAPY: s/p 6 cycles of CHOP-Rituxan (first cycle was without Rituxan). Last cycle given on 10/21/09.  She was on maintenance Rituxan q 2 months x 2 years between 11/2009 and 10/2011.   INTERVAL HISTORY: Please see below for problem oriented charting. She feels well. Denies new lymphadenopathy. No recent infection.  REVIEW OF SYSTEMS:   Constitutional: Denies fevers, chills or abnormal weight loss Eyes: Denies blurriness of vision Ears, nose, mouth, throat, and face: Denies mucositis or sore throat Respiratory: Denies cough, dyspnea or wheezes Cardiovascular: Denies palpitation, chest discomfort or lower extremity swelling Gastrointestinal:  Denies nausea, heartburn or change in bowel habits Skin: Denies abnormal skin rashes Lymphatics: Denies new lymphadenopathy or easy bruising Neurological:Denies numbness, tingling or new weaknesses Behavioral/Psych: Mood is stable, no new changes  All other systems were reviewed with the patient and are negative.  I have reviewed the past medical history, past surgical history, social history and family history with the patient and they are unchanged from previous note.  ALLERGIES:  has No Known Allergies.  MEDICATIONS:  Current Outpatient Prescriptions  Medication Sig Dispense Refill  . acetaminophen (TYLENOL) 500 MG tablet Take 500 mg by mouth every 6 (six) hours as needed. Takes for pain    . alendronate (FOSAMAX) 70 MG tablet TAKE 1 TABLET BY MOUTH EVERY 7 DAYS .Marland Kitchen TAKE WITH A FULL  GLASS OF WATER ... ON AN EMPTY STOMACH 4 tablet 5  . amLODipine (NORVASC) 10 MG tablet TAKE 1 TABLET BY MOUTH EVERY DAY 30 tablet 5  . aspirin 81 MG tablet Take 81 mg by mouth daily.     Marland Kitchen atorvastatin (LIPITOR) 80 MG tablet TAKE 1 TABLET EVERY DAY 30 tablet 3  . Calcium Carbonate-Vitamin D (CALCIUM + D PO) Take 500 mg by mouth 2 (two) times daily.     . cholecalciferol (VITAMIN D) 1000 UNITS tablet Take 1,000 Units by mouth daily.    . clopidogrel (PLAVIX) 75 MG tablet Take 1 tablet (75 mg total) by mouth daily. 30 tablet 3  . magnesium oxide (MAG-OX) 400 (241.3 MG) MG tablet TAKE 1 TABLET BY MOUTH TWICE A DAY 60 tablet 6  . metoprolol (LOPRESSOR) 50 MG tablet TAKE 1 TABLET BY MOUTH TWICE A DAY 60 tablet 3  . Multiple Vitamins-Minerals (MULTIVITAMIN WITH MINERALS) tablet Take 1 tablet by mouth daily.     . nitroGLYCERIN (NITROSTAT) 0.4 MG SL tablet Place 1 tablet (0.4 mg total) under the tongue every 5 (five) minutes as needed. 25 tablet 5   No current facility-administered medications for this visit.    PHYSICAL EXAMINATION: ECOG PERFORMANCE STATUS: 0 - Asymptomatic  Filed Vitals:   04/08/15 1436  BP: 125/70  Pulse: 78  Temp: 98.5 F (36.9 C)  Resp: 18   Filed Weights   04/08/15 1436  Weight: 156 lb 9.6 oz (71.033 kg)    GENERAL:alert, no distress and comfortable SKIN: skin color, texture, turgor are normal, no rashes or significant lesions EYES: normal, Conjunctiva are pink and non-injected, sclera clear OROPHARYNX:no exudate, no erythema and lips, buccal mucosa, and tongue normal  NECK: supple, thyroid normal  size, non-tender, without nodularity LYMPH:  no palpable lymphadenopathy in the cervical, axillary or inguinal LUNGS: clear to auscultation and percussion with normal breathing effort HEART: regular rate & rhythm and no murmurs and no lower extremity edema ABDOMEN:abdomen soft, non-tender and normal bowel sounds Musculoskeletal:no cyanosis of digits and no clubbing   NEURO: alert & oriented x 3 with fluent speech, no focal motor/sensory deficits  LABORATORY DATA:  I have reviewed the data as listed    Component Value Date/Time   NA 145 04/08/2015 1419   NA 143 11/23/2011 0912   NA 144 07/27/2011 1420   K 3.9 04/08/2015 1419   K 4.0 11/23/2011 0912   K 4.0 07/27/2011 1420   CL 103 09/12/2012 0926   CL 105 11/23/2011 0912   CL 98 07/27/2011 1420   CO2 26 04/08/2015 1419   CO2 26 11/23/2011 0912   CO2 31 07/27/2011 1420   GLUCOSE 96 04/08/2015 1419   GLUCOSE 100* 09/12/2012 0926   GLUCOSE 79 11/23/2011 0912   GLUCOSE 96 07/27/2011 1420   BUN 16.7 04/08/2015 1419   BUN 20 11/23/2011 0912   BUN 16 07/27/2011 1420   CREATININE 0.9 04/08/2015 1419   CREATININE 0.95 11/23/2011 0912   CREATININE 0.9 07/27/2011 1420   CALCIUM 10.4 04/08/2015 1419   CALCIUM 9.8 11/23/2011 0912   CALCIUM 9.7 07/27/2011 1420   PROT 6.8 04/08/2015 1419   PROT 7.2 08/14/2013 0833   PROT 7.2 07/27/2011 1420   ALBUMIN 3.9 04/08/2015 1419   ALBUMIN 4.3 08/14/2013 0833   ALBUMIN 3.8 07/27/2011 1420   AST 19 04/08/2015 1419   AST 24 08/14/2013 0833   AST 31 07/27/2011 1420   ALT 16 04/08/2015 1419   ALT 19 08/14/2013 0833   ALT 28 07/27/2011 1420   ALKPHOS 108 04/08/2015 1419   ALKPHOS 93 08/14/2013 0833   ALKPHOS 127* 07/27/2011 1420   BILITOT 0.30 04/08/2015 1419   BILITOT 0.4 08/14/2013 0833   BILITOT 0.50 07/27/2011 1420   GFRNONAA 56* 04/11/2011 0428   GFRAA 65* 04/11/2011 0428    No results found for: SPEP, UPEP  Lab Results  Component Value Date   WBC 10.1 04/08/2015   NEUTROABS 6.7* 04/08/2015   HGB 13.7 04/08/2015   HCT 41.6 04/08/2015   MCV 93.9 04/08/2015   PLT 197 04/08/2015      Chemistry      Component Value Date/Time   NA 145 04/08/2015 1419   NA 143 11/23/2011 0912   NA 144 07/27/2011 1420   K 3.9 04/08/2015 1419   K 4.0 11/23/2011 0912   K 4.0 07/27/2011 1420   CL 103 09/12/2012 0926   CL 105 11/23/2011 0912   CL 98  07/27/2011 1420   CO2 26 04/08/2015 1419   CO2 26 11/23/2011 0912   CO2 31 07/27/2011 1420   BUN 16.7 04/08/2015 1419   BUN 20 11/23/2011 0912   BUN 16 07/27/2011 1420   CREATININE 0.9 04/08/2015 1419   CREATININE 0.95 11/23/2011 0912   CREATININE 0.9 07/27/2011 1420      Component Value Date/Time   CALCIUM 10.4 04/08/2015 1419   CALCIUM 9.8 11/23/2011 0912   CALCIUM 9.7 07/27/2011 1420   ALKPHOS 108 04/08/2015 1419   ALKPHOS 93 08/14/2013 0833   ALKPHOS 127* 07/27/2011 1420   AST 19 04/08/2015 1419   AST 24 08/14/2013 0833   AST 31 07/27/2011 1420   ALT 16 04/08/2015 1419   ALT 19 08/14/2013 0833   ALT  28 07/27/2011 1420   BILITOT 0.30 04/08/2015 1419   BILITOT 0.4 08/14/2013 0833   BILITOT 0.50 07/27/2011 1420      ASSESSMENT & PLAN:  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 She had no clinical signs of disease. I recommend transitioning her care to cancer survivorship clinic and she agreed  PAD (peripheral artery disease) she will continue current medical management. I recommend close follow-up with primary care doctor for medication adjustment.    Orders Placed This Encounter  Procedures  . Comprehensive metabolic panel    Standing Status: Future     Number of Occurrences:      Standing Expiration Date: 05/12/2016  . CBC with Differential/Platelet    Standing Status: Future     Number of Occurrences:      Standing Expiration Date: 05/12/2016  . Lactate dehydrogenase    Standing Status: Future     Number of Occurrences:      Standing Expiration Date: 05/12/2016  . Amb Referral to Survivorship Long term    Referral Priority:  Routine    Referral Type:  Consultation    Number of Visits Requested:  1   All questions were answered. The patient knows to call the clinic with any problems, questions or concerns. No barriers to learning was detected. I spent 15 minutes counseling the patient face to face. The total time spent in  the appointment was 20 minutes and more than 50% was on counseling and review of test results     [facility], [place], MD 04/08/2015 3:59 PM

## 2015-04-10 ENCOUNTER — Telehealth: Payer: Self-pay | Admitting: Hematology and Oncology

## 2015-04-10 NOTE — Telephone Encounter (Signed)
Daughter is aware of 02/2016 appointments

## 2015-04-11 ENCOUNTER — Other Ambulatory Visit: Payer: Self-pay | Admitting: Adult Health

## 2015-04-11 ENCOUNTER — Telehealth: Payer: Self-pay | Admitting: Adult Health

## 2015-04-11 DIAGNOSIS — Z8572 Personal history of non-Hodgkin lymphomas: Secondary | ICD-10-CM

## 2015-04-11 NOTE — Telephone Encounter (Signed)
I spoke with Ms. Wilmarth to introduce myself and ensure that she was aware of her survivorship appointments scheduled for 02/2016.  She stated that she was aware and that her daughter had helped schedule them recently.  I let her know what my role will be in her survivorship care and encouraged her to call me with any questions or concerns before her next appt here.  She is welcome to call me, if the need arises, before we see her again in 2017.  I have mailed a letter, an appointment calendar, and my business card.  She expressed understanding for her upcoming appts and voiced appreciation for the call.  I look forward to participating in her care.   Mike Craze, NP [place] [phone]

## 2015-04-28 NOTE — Progress Notes (Signed)
Chief Complaint  Patient presents with  . Follow-up    CAD/Aortic Stenosis/ PT Has no complaints      History of Present Illness: 74 yo WF with history of Non-Hodgkins lymphoma, tobacco abuse, HTN, hyperlipidemia, PAD and CAD here today for follow up. She was admitted to [facility] with inferior STEMI 03/20/09 at which time she was found to have a total occlusion of the proximal RCA at the area of a prior stent. A drug eluting stent was deployed in the proximal RCA. Her LV gram and echo showed basal to mid inferior and posterior wall hypokinesis with overall preserved EF. During admission she complained of bilateral lower ext pain with ambulation and weakness with ambulation. We ordered lower extremity arterial dopplers that showed total occlusion of both SFA at the origin with ABI of 0.60 bilaterally. There was also high grade right common femoral artery stenosis. She was noted to have uncontrolled HTN and unequal blood pressures in her arms. A CT angiogram of the chest/neck/abdomen was ordered. This showed stenosis of the left subclavian artery, mildly dilated aortic root, mild disease in bilateral renal arteries. There is diffuse vascular disease including the mesenteric vessels. Unfortunately, the CT scan also showed a right paraspinal soft tissue mass as well as diffuse retroperitoneal and mesenteric adenopathy. She was then diagnosed with Stage III Non-Hodgkins lymphoma and is undergoing treatment under the guidance of Dr. Sherryl Manges. She also developed a DVT and was treated with coumadin.  In the Fall of 2011, she hit her left leg and developed an ulcer over the anterior aspect of the left leg, just above the ankle. She was unable to heal this area. Dr. Bridgett Larsson performed left common femoral artery to left popliteal artery bypass on 02/15/10. She then had her left great toe ampuated on 03/14/10 by Dr. Bridgett Larsson. In April she had placement of a stent in the distal portion of the bypass at the anastamosis with the  popliteal artery per Dr. Bridgett Larsson. Dr. Bridgett Larsson is following her vascular disease. Recently seen in [facility] office 12/13. She fell in November 2012 and broke her left hip. Dr. Tamera Punt put a pin in her left hip. Echo March 2016 with normal LV function, mild aortic stenosis and mild mitral regurgitation. She has been followed in Oncology and she is felt to be in remission.   She is here today for cardiac follow up. She has been feeling great. She has had no chest pain, SOB, palpitations. She did have an episode last week where she fell in the bathroom. She stood up from the toilet and hit the floor. She thinks she blacked out. No dizziness before event or since. She has been taking all of her medications. Her legs only hurt when walking long distances. No rest pain.   Primary Care Physician: None  Past Medical History  Diagnosis Date  . Atherosclerosis of native arteries of the extremities with ulceration(440.23)   . Hypertension   . CAD (coronary artery disease)   . Myocardial infarction ([place])   . DVT (deep venous thrombosis) ([place])   . Neuromuscular disorder ([place]) 02/2009  . Osteoporosis 08/17/2011  . Follicular lymphoma ([place]) dx'd 05/2009    chemo R-CHOP comp 09/2009; maintenance  Rituxan from 11/2009 to 09/2011    Past Surgical History  Procedure Laterality Date  . Coronary angioplasty with stent placement    . Femoral-popliteal bypass graft  02/15/10    Left CFA to above knee popliteal BPG by Dr. Adele Barthel  . Toe amputation  03/13/10    Left Great toe by Dr. Adele Barthel  . Angioplasty / stenting femoral  08/28/10    Left distal anastomosis, angioplasty w/stent by Dr. Adele Barthel  . Appendectomy    . Tonsillectomy    . Cholecystectomy      Gall bladder  . Femur im nail  04/05/2011    Procedure: INTRAMEDULLARY (IM) NAIL FEMORAL;  Surgeon: Nita Sells, MD;  Location: [facility];  Service: Orthopedics;  Laterality: Left;  Left hip trochanteric nail    Current Outpatient Prescriptions    Medication Sig Dispense Refill  . acetaminophen (TYLENOL) 500 MG tablet Take 500 mg by mouth every 6 (six) hours as needed. Takes for pain    . alendronate (FOSAMAX) 70 MG tablet TAKE 1 TABLET BY MOUTH EVERY 7 DAYS .Marland Kitchen TAKE WITH A FULL GLASS OF WATER ... ON AN EMPTY STOMACH 4 tablet 5  . amLODipine (NORVASC) 10 MG tablet TAKE 1 TABLET BY MOUTH EVERY DAY 30 tablet 5  . aspirin 81 MG tablet Take 81 mg by mouth daily.     Marland Kitchen atorvastatin (LIPITOR) 80 MG tablet TAKE 1 TABLET EVERY DAY 30 tablet 3  . Calcium Carbonate-Vitamin D (CALCIUM + D PO) Take 500 mg by mouth 2 (two) times daily.     . cholecalciferol (VITAMIN D) 1000 UNITS tablet Take 1,000 Units by mouth daily.    . clopidogrel (PLAVIX) 75 MG tablet Take 1 tablet (75 mg total) by mouth daily. 30 tablet 3  . magnesium oxide (MAG-OX) 400 (241.3 MG) MG tablet TAKE 1 TABLET BY MOUTH TWICE A DAY 60 tablet 6  . metoprolol (LOPRESSOR) 50 MG tablet TAKE 1 TABLET BY MOUTH TWICE A DAY 60 tablet 3  . Multiple Vitamins-Minerals (MULTIVITAMIN WITH MINERALS) tablet Take 1 tablet by mouth daily.     . nitroGLYCERIN (NITROSTAT) 0.4 MG SL tablet Place 1 tablet (0.4 mg total) under the tongue every 5 (five) minutes as needed. 25 tablet 5   No current facility-administered medications for this visit.    No Known Allergies  Social History   Social History  . Marital Status: Divorced    Spouse Name: N/A  . Number of Children: N/A  . Years of Education: N/A   Occupational History  . Not on file.   Social History Main Topics  . Smoking status: Former Smoker -- 1.00 packs/day for 50 years    Types: Cigarettes    Quit date: 02/25/2009  . Smokeless tobacco: Never Used  . Alcohol Use: No  . Drug Use: Yes  . Sexual Activity: Not on file   Other Topics Concern  . Not on file   Social History Narrative    Family History  Problem Relation Age of Onset  . Heart attack Father   . Cancer Brother     lung ca    Review of Systems:  As stated in  the HPI and otherwise negative.   BP 127/76 mmHg  Pulse 63  Ht '5\' 6"'$  (1.676 m)  Wt 159 lb 6.4 oz (72.303 kg)  BMI 25.74 kg/m2  SpO2 99%  Physical Examination: General: Well developed, well nourished, NAD HEENT: OP clear, mucus membranes moist SKIN: warm, dry. No rashes. Neuro: No focal deficits Musculoskeletal: Muscle strength 5/5 all ext Psychiatric: Mood and affect normal Neck: No JVD, no carotid bruits, no thyromegaly, no lymphadenopathy. Lungs:Clear bilaterally, no wheezes, rhonci, crackles Cardiovascular: Regular rate and rhythm. Systolic  Murmur noted. No gallops or rubs. Abdomen:Soft. Bowel sounds present.  Non-tender.  Extremities: No lower extremity edema. Pulses are non-palpable in the bilateral DP/PT.  Echo March 2016: Left ventricle: The cavity size was normal. There was severe focal basal and moderate concentric hypertrophy of the left ventricle. Systolic function was vigorous. The estimated ejection fraction was in the range of 65% to 70%. Wall motion was normal; there were no regional wall motion abnormalities. Doppler parameters are consistent with abnormal left ventricular relaxation (grade 1 diastolic dysfunction). Doppler parameters are consistent with elevated ventricular end-diastolic filling pressure. - Aortic valve: Trileaflet; moderately thickened, moderately calcified leaflets. Valve mobility was restricted. There was mild stenosis. Mean gradient (S): 12 mm Hg. Peak gradient (S): 22 mm Hg. - Aortic root: The aortic root was normal in size. - Mitral valve: Structurally normal valve. There was mild regurgitation. - Left atrium: The atrium was normal in size. - Right ventricle: Systolic function was normal. - Right atrium: The atrium was normal in size. - Tricuspid valve: There was mild regurgitation. - Pulmonary arteries: Systolic pressure was within the normal range. - Inferior vena cava: The vessel was normal in size. -  Pericardium, extracardiac: There was no pericardial effusion.  EKG:  EKG is ordered today. The ekg ordered today demonstrates NSR, rate 63 bpm. LAFB. Incomplete RBBB.   Recent Labs: 04/08/2015: ALT 16; BUN 16.7; Creatinine 0.9; HGB 13.7; Platelets 197; Potassium 3.9; Sodium 145   Lipid Panel    Component Value Date/Time   CHOL 115 08/14/2013 0833   TRIG 133.0 08/14/2013 0833   HDL 59.30 08/14/2013 0833   CHOLHDL 2 08/14/2013 0833   VLDL 26.6 08/14/2013 0833   LDLCALC 29 08/14/2013 0833     Wt Readings from Last 3 Encounters:  04/29/15 159 lb 6.4 oz (72.303 kg)  04/08/15 156 lb 9.6 oz (71.033 kg)  06/04/14 158 lb (71.668 kg)     Other studies Reviewed: Additional studies/ records that were reviewed today include: . Review of the above records demonstrates:  Assessment and Plan:   1. CAD: No recent chest pain. She is on good medical therapy. Will continue ASA, Plavix, statin and beta blocker. LV function normal by echo 2016.  2. Aortic stenosis: Mild by echo 2016.   3. PAD: Stable. She needs repeat ABI now.   4. HTN: BP controlled. Continue current meds. Check BMET now.   5. Hyperlipidemia: Continue statin. Repeat lipids and LFTS now.   Current medicines are reviewed at length with the patient today.  The patient does not have concerns regarding medicines.  The following changes have been made:  no change  Labs/ tests ordered today include:   Orders Placed This Encounter  Procedures  . Basic Metabolic Panel (BMET)  . Lipid Profile  . Hepatic function panel  . EKG 12-Lead    Disposition:   FU with me in 6 months  Signed, Lauree Chandler, MD 04/29/2015 5:05 PM    [facility] [address], [place], [place]  [address] Phone: [phone]; Fax: [phone]

## 2015-04-29 ENCOUNTER — Ambulatory Visit (INDEPENDENT_AMBULATORY_CARE_PROVIDER_SITE_OTHER): Payer: Medicare Other | Admitting: Cardiovascular Disease

## 2015-04-29 ENCOUNTER — Encounter: Payer: Self-pay | Admitting: Cardiovascular Disease

## 2015-04-29 VITALS — BP 127/76 | HR 63 | Ht 66.0 in | Wt 159.4 lb

## 2015-04-29 DIAGNOSIS — E785 Hyperlipidemia, unspecified: Secondary | ICD-10-CM

## 2015-04-29 DIAGNOSIS — I10 Essential (primary) hypertension: Secondary | ICD-10-CM

## 2015-04-29 DIAGNOSIS — I350 Nonrheumatic aortic (valve) stenosis: Secondary | ICD-10-CM | POA: Diagnosis not present

## 2015-04-29 DIAGNOSIS — I739 Peripheral vascular disease, unspecified: Secondary | ICD-10-CM

## 2015-04-29 DIAGNOSIS — I2510 Atherosclerotic heart disease of native coronary artery without angina pectoris: Secondary | ICD-10-CM | POA: Diagnosis not present

## 2015-04-29 NOTE — Patient Instructions (Signed)
Medication Instructions:  Your physician recommends that you continue on your current medications as directed. Please refer to the Current Medication list given to you today.   Labwork: Lab work to be done today--BMP, Lipid, Liver profiles  Testing/Procedures: Your physician has requested that you have a lower or upper extremity arterial duplex. This test is an ultrasound of the arteries in the legs or arms. It looks at arterial blood flow in the legs and arms. Allow one hour for Lower and Upper Arterial scans. There are no restrictions or special instructions   Follow-Up: Your physician wants you to follow-up in: 6 months.  You will receive a reminder letter in the mail two months in advance. If you don't receive a letter, please call our office to schedule the follow-up appointment.   Any Other Special Instructions Will Be Listed Below (If Applicable).     If you need a refill on your cardiac medications before your next appointment, please call your pharmacy.

## 2015-04-30 LAB — BASIC METABOLIC PANEL
BUN: 17 mg/dL (ref 7–25)
CALCIUM: 10 mg/dL (ref 8.6–10.4)
CO2: 26 mmol/L (ref 20–31)
Chloride: 104 mmol/L (ref 98–110)
Creat: 0.74 mg/dL (ref 0.60–0.93)
Glucose, Bld: 87 mg/dL (ref 65–99)
POTASSIUM: 4 mmol/L (ref 3.5–5.3)
SODIUM: 141 mmol/L (ref 135–146)

## 2015-04-30 LAB — LIPID PANEL
CHOL/HDL RATIO: 1.9 ratio (ref <=5.0)
CHOLESTEROL: 112 mg/dL — AB (ref 125–200)
HDL: 58 mg/dL (ref >=46)
LDL Cholesterol: 27 mg/dL (ref <130)
Triglycerides: 136 mg/dL (ref <150)
VLDL: 27 mg/dL (ref <30)

## 2015-04-30 LAB — HEPATIC FUNCTION PANEL
ALBUMIN: 4.4 g/dL (ref 3.6–5.1)
ALT: 16 U/L (ref 6–29)
AST: 21 U/L (ref 10–35)
Alkaline Phosphatase: 100 U/L (ref 33–130)
Bilirubin, Direct: 0.1 mg/dL (ref <=0.2)
Indirect Bilirubin: 0.3 mg/dL (ref 0.2–1.2)
TOTAL PROTEIN: 7 g/dL (ref 6.1–8.1)
Total Bilirubin: 0.4 mg/dL (ref 0.2–1.2)

## 2015-05-05 ENCOUNTER — Other Ambulatory Visit: Payer: Self-pay | Admitting: Cardiovascular Disease

## 2015-05-05 DIAGNOSIS — I739 Peripheral vascular disease, unspecified: Secondary | ICD-10-CM

## 2015-05-13 ENCOUNTER — Ambulatory Visit (HOSPITAL_COMMUNITY)
Admission: RE | Admit: 2015-05-13 | Discharge: 2015-05-13 | Disposition: A | Discharge status: Home / Self Care | Payer: Medicare Other | Source: Ambulatory Visit | Attending: Cardiology | Admitting: Cardiology

## 2015-05-13 DIAGNOSIS — I10 Essential (primary) hypertension: Secondary | ICD-10-CM | POA: Diagnosis not present

## 2015-05-13 DIAGNOSIS — I739 Peripheral vascular disease, unspecified: Secondary | ICD-10-CM | POA: Insufficient documentation

## 2015-05-17 ENCOUNTER — Other Ambulatory Visit: Payer: Self-pay | Admitting: Cardiovascular Disease

## 2015-05-17 MED ORDER — METOPROLOL TARTRATE 50 MG PO TABS: 50.0000 mg | ORAL_TABLET | Freq: Two times a day (BID) | ORAL | Status: DC

## 2015-05-17 MED ORDER — MAGNESIUM OXIDE 400 (241.3 MG) MG PO TABS: 1.0000 | ORAL_TABLET | Freq: Two times a day (BID) | ORAL | Status: DC

## 2015-05-17 MED ORDER — ATORVASTATIN CALCIUM 80 MG PO TABS: 80.0000 mg | ORAL_TABLET | Freq: Every day | ORAL | Status: DC

## 2015-05-17 MED ORDER — CLOPIDOGREL BISULFATE 75 MG PO TABS: 75.0000 mg | ORAL_TABLET | Freq: Every day | ORAL | Status: DC

## 2015-05-26 ENCOUNTER — Other Ambulatory Visit: Payer: Self-pay | Admitting: Cardiovascular Disease

## 2015-11-17 ENCOUNTER — Other Ambulatory Visit: Payer: Self-pay | Admitting: Cardiovascular Disease

## 2015-12-07 ENCOUNTER — Other Ambulatory Visit: Payer: Self-pay | Admitting: Cardiovascular Disease

## 2015-12-19 ENCOUNTER — Other Ambulatory Visit: Payer: Self-pay | Admitting: Cardiovascular Disease

## 2016-03-07 ENCOUNTER — Other Ambulatory Visit: Payer: Self-pay | Admitting: Cardiovascular Disease

## 2016-03-20 ENCOUNTER — Other Ambulatory Visit: Payer: Self-pay | Admitting: Cardiovascular Disease

## 2016-03-22 NOTE — Progress Notes (Addendum)
CLINIC:  Survivorship   REASON FOR VISIT:  Routine follow-up for history of follicular lymphoma.  BRIEF ONCOLOGIC HISTORY:  (From Dr. Calton Dach last note on 04/08/15)    INTERVAL HISTORY:  Ms. [name] presents to the [facility] today with her daughter for routine follow-up for her history of follicular lymphoma.  Overall, she reports feeling quite well. She is largely without complaints today. Her energy levels and appetite are good.  Her daughter reports that she may not drinking enough water; she really likes to drink coca-cola.  She sees her cardiologist regularly for her history of heart attack, hypertension, & hyperlipidemia.   She tells me that she does not have a PCP, but is looking for one near her home in [place]. Her cardiologist most recently checked labs to follow-up on her hyperlipidemia and hypertension. When asked if she had had a mammogram this year, she states "I don't need one of those. I'm fine!"    She has not gotten her flu shot for this year and is interested in getting it here today.     REVIEW OF SYSTEMS:  Review of Systems  Constitutional: Negative.  Negative for appetite change and fatigue.  HENT:  Negative.   Eyes: Negative.   Respiratory: Negative.   Cardiovascular: Negative.   Gastrointestinal: Negative.  Negative for abdominal distention and abdominal pain.  Endocrine: Negative.   Genitourinary: Negative.    Musculoskeletal: Negative.   Skin: Negative.   Neurological: Negative.   Hematological: Negative.  Negative for adenopathy.  Psychiatric/Behavioral: Negative.   Breast: Denies any new nodularity or masses.   A 14-point review of systems was completed and was negative, except as noted above.    PAST MEDICAL/SURGICAL HISTORY:  Past Medical History:  Diagnosis Date  . Atherosclerosis of native arteries of the extremities with ulceration(440.23)   . CAD (coronary artery disease)   . DVT (deep venous thrombosis) ([place])   .  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 Hypertension   . Myocardial infarction   . Neuromuscular disorder ([place]) 02/2009  . Osteoporosis 08/17/2011   Past Surgical History:  Procedure Laterality Date  . ANGIOPLASTY / STENTING FEMORAL  08/28/10   Left distal anastomosis, angioplasty w/stent by Dr. Adele Barthel  . APPENDECTOMY    . CHOLECYSTECTOMY     Gall bladder  . CORONARY ANGIOPLASTY WITH STENT PLACEMENT    . FEMORAL-POPLITEAL BYPASS GRAFT  02/15/10   Left CFA to above knee popliteal BPG by Dr. Adele Barthel  . FEMUR IM NAIL  04/05/2011   Procedure: INTRAMEDULLARY (IM) NAIL FEMORAL;  Surgeon: Nita Sells, MD;  Location: [facility];  Service: Orthopedics;  Laterality: Left;  Left hip trochanteric nail  . TOE AMPUTATION  03/13/10   Left Great toe by Dr. Adele Barthel  . TONSILLECTOMY       ALLERGIES:  No Known Allergies   CURRENT MEDICATIONS:  Outpatient Encounter Prescriptions as of 03/23/2016  Medication Sig  . acetaminophen (TYLENOL) 500 MG tablet Take 500 mg by mouth every 6 (six) hours as needed. Takes for pain  . alendronate (FOSAMAX) 70 MG tablet TAKE 1 TABLET BY MOUTH EVERY 7 DAYS .Marland Kitchen TAKE WITH A FULL GLASS OF WATER ... ON AN EMPTY STOMACH  . amLODipine (NORVASC) 10 MG tablet TAKE 1 TABLET BY MOUTH EVERY DAY  . aspirin 81 MG tablet Take 81 mg by mouth daily.   Marland Kitchen atorvastatin (LIPITOR) 80 MG  tablet TAKE 1 TABLET BY MOUTH EVERY DAY  . Calcium Carbonate-Vitamin D (CALCIUM + D PO) Take 500 mg by mouth 2 (two) times daily.   . cholecalciferol (VITAMIN D) 1000 UNITS tablet Take 1,000 Units by mouth daily.  . clopidogrel (PLAVIX) 75 MG tablet TAKE 1 TABLET BY MOUTH EVERY DAY  . magnesium oxide (MAG-OX) 400 (241.3 Mg) MG tablet TAKE 1 TABLET BY MOUTH TWICE A DAY  . metoprolol (LOPRESSOR) 50 MG tablet TAKE 1 TABLET BY MOUTH TWICE A DAY  . Multiple Vitamins-Minerals (MULTIVITAMIN WITH MINERALS) tablet Take 1  tablet by mouth daily.   . nitroGLYCERIN (NITROSTAT) 0.4 MG SL tablet Place 1 tablet (0.4 mg total) under the tongue every 5 (five) minutes as needed.   No facility-administered encounter medications on file as of 03/23/2016.      ONCOLOGIC FAMILY HISTORY:  Family History  Problem Relation Age of Onset  . Heart attack Father   . Cancer Brother     lung ca      SOCIAL HISTORY:  [name] is divorced and lives in [place], [place].  Her daughter is with her today.  Ms. [name] is retired. She currently smokes about 4 cigarettes per day. Previously smoked about 1 pack per day x 50 years.  She denies any alcohol or illicit drug use.   PHYSICAL EXAMINATION:  Vital Signs: Vitals:   03/23/16 1234  BP: (!) 141/68  Pulse: 63  Resp: 16  Temp: 98.2 F (36.8 C)   Filed Weights   03/23/16 1234  Weight: 156 lb 12.8 oz (71.1 kg)   General: Female in no acute distress. Accompanied by her daughter today. HEENT: Head is normocephalic.  Pupils equal and reactive to light. Conjunctivae clear without exudate.  Sclerae anicteric. Oral mucosa is pink, moist.  Oropharynx is pink without lesions or erythema. Dentures in place. Lymph: No cervical, supraclavicular, infraclavicular, axillary, or inguinal lymphadenopathy noted on palpation.  Cardiovascular: Regular rate and rhythm. Respiratory: Clear to auscultation bilaterally. Chest expansion symmetric; breathing non-labored.  GI: Abdomen soft and round; non-tender, non-distended. Bowel sounds normoactive. No hepatosplenomegaly.   GU: Deferred.  Neuro: No focal deficits. Steady gait.  Psych: Mood and affect normal and appropriate for situation.  Extremities: No edema. Skin: Warm and dry.   LABORATORY DATA:  CBC    Component Value Date/Time   WBC 10.9 (H) 03/23/2016 1216   WBC 7.5 04/11/2011 0428   RBC 4.24 03/23/2016 1216   RBC 3.33 (L) 04/11/2011 0428   HGB 12.8 03/23/2016 1216   HCT 40.3 03/23/2016 1216   PLT 234 03/23/2016  1216   MCV 95.0 03/23/2016 1216   MCH 30.2 03/23/2016 1216   MCH 30.0 04/11/2011 0428   MCHC 31.8 03/23/2016 1216   MCHC 31.3 04/11/2011 0428   RDW 14.4 03/23/2016 1216   LYMPHSABS 2.7 03/23/2016 1216   MONOABS 0.9 03/23/2016 1216   EOSABS 0.2 03/23/2016 1216   BASOSABS 0.0 03/23/2016 1216   CMP Latest Ref Rng & Units 03/23/2016 04/29/2015 04/08/2015  Glucose 70 - 140 mg/dl 98 87 96  BUN 7.0 - 26.0 mg/dL 11.4 17 16.7  Creatinine 0.6 - 1.1 mg/dL 0.9 0.74 0.9  Sodium 136 - 145 mEq/L 144 141 145  Potassium 3.5 - 5.1 mEq/L 4.1 4.0 3.9  Chloride 98 - 110 mmol/L - 104 -  CO2 22 - 29 mEq/L '29 26 26  '$ Calcium 8.4 - 10.4 mg/dL 10.5(H) 10.0 10.4  Total Protein 6.4 - 8.3 g/dL 7.1 7.0 6.8  Total Bilirubin 0.20 - 1.20 mg/dL 0.36 0.4 0.30  Alkaline Phos 40 - 150 U/L 134 100 108  AST 5 - 34 U/L '19 21 19  '$ ALT 0 - 55 U/L '14 16 16   '$ Results for JERRINE, URSCHEL (MRN [id number])   Ref. Range 03/23/2016 12:16  LDH Latest Ref Range: 125 - 245 U/L 247 (H)    Note: *CBC reviewed with patient/daughter.  CMET and LDH values not resulted/available for review during office visit.     DIAGNOSTIC IMAGING:  None for this visit.     ASSESSMENT AND PLAN:  Ms.. Mangini is a pleasant 75 y.o. 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  Follicular international prognostic index score: 4 (high-risk). She was initially diagnosed in early 2011 and underwent R-CHOP x 6 cycles (cycle #1 did not include Rituxan); she completed chemotherapy on 10/21/09; She went on to complete 2 years of maintenance Rituxan therapy between 11/2009-10/2011.  Ms.Lizak presents to the [facility] for surveillance and routine follow-up.    1. History of Stage III follicular lymphoma:  Ms. Trunnell is currently clinically without evidence of disease or recurrence of lymphoma. She is now about 6.5 years out from her initial diagnosis.  Given that she is  clinically doing very well with no complaints, we discussed the option of her "graduating" from follow-up here at the cancer center.  I stressed the importance of her finding a PCP (which her daughter agreed she will help the patient do) to continue to follow with yearly labs and physical exam.    2. Mild hypercalcemia & mildly elevated LDH: Her serum calcium is mildly elevated at 10.5 (corrected calcium 10.8).  Her LDH is also mildly elevated at 247.   The patient is completely asymptomatic and there is no lymphadenopathy present on physical exam.  The highest risk of recurrence/relapse for follicular lymphomas is within the first 2 years, but it certainly is not impossible to experience late recurrence.  Ms. Hurlbutt did have high-risk disease at the time of diagnosis with her prognostic index score and cytogenetics (CD20 positivity).  Both of these lab studies are non-specific for lymphoma, however they can certainly be elevated in indolent cancers.  I will discuss these findings with the patient's medical oncologist, Dr. Alvy Bimler, to see if she thinks these lab values alone warrant additional evaluation.  If she agrees that these lab findings are likely unrelated to her history of lymphoma, then we will proceed with the aforementioned plan of "graduating" the patient from follow-up here at the cancer center and can see her as needed in the future.    3. Cancer screening:  Due to Ms. Pulver's history and age, she should receive screening for skin cancers, breast cancer, & colon cancer.  We also discussed lung cancer screening with low-dose CT chest given her history of heavy tobacco use.  I offered to place orders for mammogram and low-dose CT chest for breast and lung cancer screening, but she would prefer to follow-up on these things with her PCP, which is reasonable. I stress the importance of cancer screening, as well as health maintenance with vaccinations and routine physical, which Ms. Rhinehart was  initially reluctant to hear. However after our discussion, she did voice understanding of the importance of these tests, and tells me she plans to follow-up with her PCP once she finds a provider near her home.    4. Smoking cessation counseling:  I counseled Ms.  Bass on the importance of smoking cessation, not only as it relates to her history of cancer, but also given her history of heart disease. She understands that smoking increases her risks for future health problems. She is slightly reluctant to work on aggressive cessation measures at this time, as she states when she is ready she will "stop cold [place]."  I provided education that we do have nicotine replacement products that may be more helpful in her cessation efforts, and to certainly let us know if she would like our help when she is ready. I encouraged her to continue to work on cutting back on how many cigarettes she smokes per day. We discussed different distraction techniques that she could try when she feels the urge to smoke.    5. Annual flu vaccine: Annual flu vaccine given during today's office visit.    Dispo:  -She will "graduate" from follow-up at the cancer center today, unless directed otherwise by Dr. Alvy Bimler.  -Encouraged patient  to call us with any questions or concerns in the future. Stressed the importance of finding a PCP to help monitor her health long-term.     A total of 35 minutes of face-to-face time was spent with this patient with greater than 50% of that time in counseling and care-coordination.   Mike Craze, NP [facility] [phone]   Note: PRIMARY CARE PROVIDER No PCP Per Patient None None

## 2016-03-23 ENCOUNTER — Other Ambulatory Visit (HOSPITAL_BASED_OUTPATIENT_CLINIC_OR_DEPARTMENT_OTHER): Payer: Medicare Other

## 2016-03-23 ENCOUNTER — Encounter: Payer: Medicare Other | Admitting: Nurse Practitioner

## 2016-03-23 ENCOUNTER — Ambulatory Visit (HOSPITAL_BASED_OUTPATIENT_CLINIC_OR_DEPARTMENT_OTHER): Payer: Medicare Other | Admitting: Adult Health

## 2016-03-23 ENCOUNTER — Encounter: Payer: Self-pay | Admitting: Adult Health

## 2016-03-23 ENCOUNTER — Other Ambulatory Visit: Payer: Medicare Other

## 2016-03-23 VITALS — BP 141/68 | HR 63 | Temp 98.2°F | Resp 16 | Wt 156.8 lb

## 2016-03-23 DIAGNOSIS — C8298 Follicular lymphoma, unspecified, lymph nodes of multiple sites: Secondary | ICD-10-CM

## 2016-03-23 DIAGNOSIS — R740 Nonspecific elevation of levels of transaminase and lactic acid dehydrogenase [LDH]: Secondary | ICD-10-CM

## 2016-03-23 DIAGNOSIS — Z8572 Personal history of non-Hodgkin lymphomas: Secondary | ICD-10-CM

## 2016-03-23 DIAGNOSIS — Z23 Encounter for immunization: Secondary | ICD-10-CM | POA: Diagnosis not present

## 2016-03-23 LAB — CBC WITH DIFFERENTIAL/PLATELET
BASO%: 0.3 % (ref 0.0–2.0)
BASOS ABS: 0 10*3/uL (ref 0.0–0.1)
EOS ABS: 0.2 10*3/uL (ref 0.0–0.5)
EOS%: 1.6 % (ref 0.0–7.0)
HCT: 40.3 % (ref 34.8–46.6)
HEMOGLOBIN: 12.8 g/dL (ref 11.6–15.9)
LYMPH%: 24.9 % (ref 14.0–49.7)
MCH: 30.2 pg (ref 25.1–34.0)
MCHC: 31.8 g/dL (ref 31.5–36.0)
MCV: 95 fL (ref 79.5–101.0)
MONO#: 0.9 10*3/uL (ref 0.1–0.9)
MONO%: 7.9 % (ref 0.0–14.0)
NEUT#: 7.1 10*3/uL — ABNORMAL HIGH (ref 1.5–6.5)
NEUT%: 65.3 % (ref 38.4–76.8)
PLATELETS: 234 10*3/uL (ref 145–400)
RBC: 4.24 10*6/uL (ref 3.70–5.45)
RDW: 14.4 % (ref 11.2–14.5)
WBC: 10.9 10*3/uL — AB (ref 3.9–10.3)
lymph#: 2.7 10*3/uL (ref 0.9–3.3)

## 2016-03-23 LAB — COMPREHENSIVE METABOLIC PANEL
ALT: 14 U/L (ref 0–55)
ANION GAP: 11 meq/L (ref 3–11)
AST: 19 U/L (ref 5–34)
Albumin: 3.6 g/dL (ref 3.5–5.0)
Alkaline Phosphatase: 134 U/L (ref 40–150)
BILIRUBIN TOTAL: 0.36 mg/dL (ref 0.20–1.20)
BUN: 11.4 mg/dL (ref 7.0–26.0)
CALCIUM: 10.5 mg/dL — AB (ref 8.4–10.4)
CO2: 29 mEq/L (ref 22–29)
CREATININE: 0.9 mg/dL (ref 0.6–1.1)
Chloride: 104 mEq/L (ref 98–109)
EGFR: 64 mL/min/{1.73_m2} — AB (ref >90)
Glucose: 98 mg/dl (ref 70–140)
Potassium: 4.1 mEq/L (ref 3.5–5.1)
Sodium: 144 mEq/L (ref 136–145)
TOTAL PROTEIN: 7.1 g/dL (ref 6.4–8.3)

## 2016-03-23 LAB — LACTATE DEHYDROGENASE: LDH: 247 U/L — ABNORMAL HIGH (ref 125–245)

## 2016-03-23 MED ORDER — INFLUENZA VAC SPLIT QUAD 0.5 ML IM SUSY
0.5000 mL | PREFILLED_SYRINGE | Freq: Once | INTRAMUSCULAR | Status: AC
  Administered 2016-03-23: 0.5 mL via INTRAMUSCULAR
  Filled 2016-03-23: qty 0.5

## 2016-03-26 ENCOUNTER — Telehealth: Payer: Self-pay | Admitting: Adult Health

## 2016-03-26 NOTE — Telephone Encounter (Signed)
Encounter opened in error

## 2016-03-26 NOTE — Telephone Encounter (Signed)
I called and spoke with [name] regarding her slightly elevated calcium and slightly elevated LDH levels. I shared with her that there are several reasons that both labs could be increased, but we want to make sure that we are not dealing with a cancer recurrence. I spoke directly with Dr. Alvy Bimler regarding the patient's lab values, and per her suggestion we will bring the patient back in 3 months for repeat labs and visit with Dr. Elson Areas. I shared with the patient again that we have low suspicion that this is anything to worry about with regards to her history of lymphoma, but we want to follow-up just in case.  Ms. Handler voiced understanding and agrees with this plan.  She asked that we call her daughter Cecille Rubin, at her work number [phone], ext [phone] , to get the appointments scheduled. I have placed a scheduling request to our schedulers contact the patient's daughter at work. Encourage the patient to call us with any further questions.   Mike Craze, NP [place] [phone]

## 2016-03-30 ENCOUNTER — Telehealth: Payer: Self-pay | Admitting: Hematology and Oncology

## 2016-03-30 NOTE — Telephone Encounter (Signed)
sw pt dtr to confirm 06/15/16 appt date/time

## 2016-05-13 ENCOUNTER — Other Ambulatory Visit: Payer: Self-pay | Admitting: Cardiovascular Disease

## 2016-06-01 ENCOUNTER — Encounter: Payer: Self-pay | Admitting: Cardiovascular Disease

## 2016-06-01 ENCOUNTER — Ambulatory Visit (INDEPENDENT_AMBULATORY_CARE_PROVIDER_SITE_OTHER): Payer: Medicare Other | Admitting: Cardiovascular Disease

## 2016-06-01 VITALS — BP 140/66 | HR 69 | Ht 64.0 in | Wt 153.6 lb

## 2016-06-01 DIAGNOSIS — I10 Essential (primary) hypertension: Secondary | ICD-10-CM | POA: Diagnosis not present

## 2016-06-01 DIAGNOSIS — I350 Nonrheumatic aortic (valve) stenosis: Secondary | ICD-10-CM | POA: Diagnosis not present

## 2016-06-01 DIAGNOSIS — I739 Peripheral vascular disease, unspecified: Secondary | ICD-10-CM | POA: Diagnosis not present

## 2016-06-01 DIAGNOSIS — I2510 Atherosclerotic heart disease of native coronary artery without angina pectoris: Secondary | ICD-10-CM | POA: Diagnosis not present

## 2016-06-01 DIAGNOSIS — E7800 Pure hypercholesterolemia, unspecified: Secondary | ICD-10-CM

## 2016-06-01 MED ORDER — NITROGLYCERIN 0.4 MG SL SUBL: 0.4000 mg | SUBLINGUAL_TABLET | SUBLINGUAL | 6 refills | Status: DC | PRN

## 2016-06-01 MED ORDER — MAGNESIUM OXIDE 400 (241.3 MG) MG PO TABS: 1.0000 | ORAL_TABLET | Freq: Two times a day (BID) | ORAL | 3 refills | Status: DC

## 2016-06-01 MED ORDER — ATORVASTATIN CALCIUM 80 MG PO TABS: 80.0000 mg | ORAL_TABLET | Freq: Every day | ORAL | 3 refills | Status: DC

## 2016-06-01 MED ORDER — METOPROLOL TARTRATE 50 MG PO TABS: 50.0000 mg | ORAL_TABLET | Freq: Two times a day (BID) | ORAL | 3 refills | Status: AC

## 2016-06-01 MED ORDER — AMLODIPINE BESYLATE 10 MG PO TABS: 10.0000 mg | ORAL_TABLET | Freq: Every day | ORAL | 3 refills | Status: AC

## 2016-06-01 MED ORDER — CLOPIDOGREL BISULFATE 75 MG PO TABS: 75.0000 mg | ORAL_TABLET | Freq: Every day | ORAL | 3 refills | Status: AC

## 2016-06-01 NOTE — Progress Notes (Signed)
Chief Complaint  Patient presents with  . Routine OV    History of Present Illness: 76 yo WF with history of Non-Hodgkins lymphoma, tobacco abuse, HTN, hyperlipidemia, PAD and CAD here today for follow up. She was admitted to [facility] with inferior STEMI 03/20/09 at which time she was found to have a total occlusion of the proximal RCA at the area of a prior stent. A drug eluting stent was deployed in the proximal RCA. Her LV gram and echo showed basal to mid inferior and posterior wall hypokinesis with overall preserved EF. During admission she complained of bilateral lower ext pain with ambulation and weakness with ambulation. We ordered lower extremity arterial dopplers that showed total occlusion of both SFA at the origin with ABI of 0.60 bilaterally. There was also high grade right common femoral artery stenosis. She was noted to have uncontrolled HTN and unequal blood pressures in her arms. A CT angiogram of the chest/neck/abdomen was ordered. This showed stenosis of the left subclavian artery, mildly dilated aortic root, mild disease in bilateral renal arteries. There is diffuse vascular disease including the mesenteric vessels. Unfortunately, the CT scan also showed a right paraspinal soft tissue mass as well as diffuse retroperitoneal and mesenteric adenopathy. She was then diagnosed with Stage III Non-Hodgkins lymphoma and is undergoing treatment under the guidance of Dr. Sherryl Manges. She also developed a DVT and was treated with coumadin.  In the Fall of 2011, she hit her left leg and developed an ulcer over the anterior aspect of the left leg, just above the ankle. She was unable to heal this area. Dr. Bridgett Larsson performed left common femoral artery to left popliteal artery bypass on 02/15/10. She then had her left great toe ampuated on 03/14/10 by Dr. Bridgett Larsson. This was followed by placement of a stent in the distal portion of the bypass at the anastamosis with the popliteal artery per Dr. Bridgett Larsson. Dr. Bridgett Larsson is  following her vascular disease.  She fell in November 2012 and broke her left hip. Dr. Tamera Punt put a pin in her left hip. Echo March 2016 with normal LV function, mild aortic stenosis and mild mitral regurgitation. She has been followed in Oncology and she is felt to be in remission.   She is here today for cardiac follow up. She has been feeling great. She has had no chest pain, SOB, palpitations. She has been taking all of her medications. Her legs only hurt when walking long distances. No rest pain.   Primary Care Physician: No PCP Per Patient   Past Medical History:  Diagnosis Date  . Atherosclerosis of native arteries of the extremities with ulceration(440.23)   . CAD (coronary artery disease)   . DVT (deep venous thrombosis) ([place])   . 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 Hypertension   . Myocardial infarction   . Neuromuscular disorder ([place]) 02/2009  . Osteoporosis 08/17/2011    Past Surgical History:  Procedure Laterality Date  . ANGIOPLASTY / STENTING FEMORAL  08/28/10   Left distal anastomosis, angioplasty w/stent by Dr. Adele Barthel  . APPENDECTOMY    . CHOLECYSTECTOMY     Gall bladder  . CORONARY ANGIOPLASTY WITH STENT PLACEMENT    . FEMORAL-POPLITEAL BYPASS GRAFT  02/15/10   Left CFA to above knee popliteal BPG by Dr. Adele Barthel  . FEMUR IM NAIL  04/05/2011   Procedure: INTRAMEDULLARY (IM) NAIL FEMORAL;  Surgeon: Nita Sells,  MD;  Location: [facility];  Service: Orthopedics;  Laterality: Left;  Left hip trochanteric nail  . TOE AMPUTATION  03/13/10   Left Great toe by Dr. Adele Barthel  . TONSILLECTOMY      Current Outpatient Prescriptions  Medication Sig Dispense Refill  . acetaminophen (TYLENOL) 500 MG tablet Take 500 mg by mouth every 6 (six) hours as needed. Takes for pain    . amLODipine (NORVASC) 10 MG tablet Take 1 tablet (10 mg total) by mouth daily. 90 tablet 3  . aspirin 81 MG  tablet Take 81 mg by mouth daily.     Marland Kitchen atorvastatin (LIPITOR) 80 MG tablet Take 1 tablet (80 mg total) by mouth daily. 90 tablet 3  . Calcium Carbonate-Vitamin D (CALCIUM + D PO) Take 500 mg by mouth 2 (two) times daily.     . cholecalciferol (VITAMIN D) 1000 UNITS tablet Take 1,000 Units by mouth daily.    . clopidogrel (PLAVIX) 75 MG tablet Take 1 tablet (75 mg total) by mouth daily. 90 tablet 3  . magnesium oxide (MAG-OX) 400 (241.3 Mg) MG tablet Take 1 tablet (400 mg total) by mouth 2 (two) times daily. 180 tablet 3  . metoprolol (LOPRESSOR) 50 MG tablet Take 1 tablet (50 mg total) by mouth 2 (two) times daily. 180 tablet 3  . Multiple Vitamins-Minerals (MULTIVITAMIN WITH MINERALS) tablet Take 1 tablet by mouth daily.     . nitroGLYCERIN (NITROSTAT) 0.4 MG SL tablet Place 1 tablet (0.4 mg total) under the tongue every 5 (five) minutes as needed. 25 tablet 6   No current facility-administered medications for this visit.     No Known Allergies  Social History   Social History  . Marital status: Divorced    Spouse name: N/A  . Number of children: N/A  . Years of education: N/A   Occupational History  . Not on file.   Social History Main Topics  . Smoking status: Current Every Day Smoker    Packs/day: 1.00    Years: 50.00    Types: Cigarettes    Last attempt to quit: 02/25/2009  . Smokeless tobacco: Never Used     Comment: smoking ~4 cigarettes/day-03/23/16-gwd  . Alcohol use No  . Drug use: No  . Sexual activity: Not on file   Other Topics Concern  . Not on file   Social History Narrative  . No narrative on file    Family History  Problem Relation Age of Onset  . Heart attack Father   . Cancer Brother     lung ca    Review of Systems:  As stated in the HPI and otherwise negative.   BP 140/66   Pulse 69   Ht '5\' 4"'$  (1.626 m)   Wt 153 lb 9.6 oz (69.7 kg)   SpO2 96%   BMI 26.37 kg/m   Physical Examination: General: Well developed, well nourished, NAD    HEENT: OP clear, mucus membranes moist  SKIN: warm, dry. No rashes. Neuro: No focal deficits  Musculoskeletal: Muscle strength 5/5 all ext  Psychiatric: Mood and affect normal  Neck: No JVD, no carotid bruits, no thyromegaly, no lymphadenopathy.  Lungs:Clear bilaterally, no wheezes, rhonci, crackles Cardiovascular: Regular rate and rhythm. Systolic  Murmur noted. No gallops or rubs. Abdomen:Soft. Bowel sounds present. Non-tender.  Extremities: No lower extremity edema. Pulses are non-palpable in the bilateral DP/PT.  Echo March 2016: Left ventricle: The cavity size was normal. There was severe focal basal and moderate concentric hypertrophy  of the left ventricle. Systolic function was vigorous. The estimated ejection fraction was in the range of 65% to 70%. Wall motion was normal; there were no regional wall motion abnormalities. Doppler parameters are consistent with abnormal left ventricular relaxation (grade 1 diastolic dysfunction). Doppler parameters are consistent with elevated ventricular end-diastolic filling pressure. - Aortic valve: Trileaflet; moderately thickened, moderately calcified leaflets. Valve mobility was restricted. There was mild stenosis. Mean gradient (S): 12 mm Hg. Peak gradient (S): 22 mm Hg. - Aortic root: The aortic root was normal in size. - Mitral valve: Structurally normal valve. There was mild regurgitation. - Left atrium: The atrium was normal in size. - Right ventricle: Systolic function was normal. - Right atrium: The atrium was normal in size. - Tricuspid valve: There was mild regurgitation. - Pulmonary arteries: Systolic pressure was within the normal range. - Inferior vena cava: The vessel was normal in size. - Pericardium, extracardiac: There was no pericardial effusion.  EKG:  EKG is ordered today. The ekg ordered today demonstrates NSR, rate 75 bpm. Incomplete RBBB. LAFB.   Recent Labs: 03/23/2016: ALT 14;  BUN 11.4; Creatinine 0.9; HGB 12.8; Platelets 234; Potassium 4.1; Sodium 144   Lipid Panel    Component Value Date/Time   CHOL 112 (L) 04/29/2015 1609   TRIG 136 04/29/2015 1609   HDL 58 04/29/2015 1609   CHOLHDL 1.9 04/29/2015 1609   VLDL 27 04/29/2015 1609   LDLCALC 27 04/29/2015 1609     Wt Readings from Last 3 Encounters:  06/01/16 153 lb 9.6 oz (69.7 kg)  03/23/16 156 lb 12.8 oz (71.1 kg)  04/29/15 159 lb 6.4 oz (72.3 kg)     Other studies Reviewed: Additional studies/ records that were reviewed today include: . Review of the above records demonstrates:  Assessment and Plan:   1. CAD without angina: No recent chest pain suggestive of angina. She is on good medical therapy. Will continue ASA, Plavix, statin and beta blocker. LV function normal by echo 2016.  2. Aortic stenosis: Mild by echo 2016. Will repeat at next visit.   3. PAD: Stable. ABI stable December 2016. She can walk 50 yards with no pain in her legs but beyond that she has pain in both legs. I have offered non-invasive testing today but she wishes to delay until this summer. She has not been seen in [facility] office in last few years.    4. HTN: BP controlled. Continue current meds.   5. Hyperlipidemia: Continue statin. Lipids have been very well controlled. LDL less than 30 in December 2016. LFTs normal 03/23/16.    Current medicines are reviewed at length with the patient today.  The patient does not have concerns regarding medicines.  The following changes have been made:  no change  Labs/ tests ordered today include:   Orders Placed This Encounter  Procedures  . EKG 12-Lead    Disposition:   FU with me in 6 months  Signed, Lauree Chandler, MD 06/01/2016 4:46 PM    [facility] [place], [place], [place]  [address] Phone: [phone]; Fax: [phone]

## 2016-06-01 NOTE — Patient Instructions (Signed)

## 2016-06-11 ENCOUNTER — Telehealth: Payer: Self-pay | Admitting: Hematology and Oncology

## 2016-06-11 NOTE — Telephone Encounter (Signed)
Pt dtr called to r/s 1/19 appt to another Friday. gavept dtr next available Friday 2/16 appt per request

## 2016-06-15 ENCOUNTER — Ambulatory Visit: Payer: Medicare Other | Admitting: Hematology and Oncology

## 2016-06-15 ENCOUNTER — Other Ambulatory Visit: Payer: Medicare Other

## 2016-06-26 ENCOUNTER — Other Ambulatory Visit: Payer: Medicare Other

## 2016-06-26 ENCOUNTER — Ambulatory Visit: Payer: Medicare Other | Admitting: Hematology and Oncology

## 2016-07-13 ENCOUNTER — Inpatient Hospital Stay (HOSPITAL_COMMUNITY)
Admission: AD | Admit: 2016-07-13 | Discharge: 2016-07-24 | DRG: 516 | Disposition: A | Discharge status: Home Health | Payer: Medicare Other | Source: Ambulatory Visit | Attending: Hematology and Oncology | Admitting: Hematology and Oncology

## 2016-07-13 ENCOUNTER — Other Ambulatory Visit (HOSPITAL_BASED_OUTPATIENT_CLINIC_OR_DEPARTMENT_OTHER): Payer: Medicare Other

## 2016-07-13 ENCOUNTER — Other Ambulatory Visit: Payer: Self-pay | Admitting: Hematology and Oncology

## 2016-07-13 ENCOUNTER — Encounter: Payer: Medicare Other | Admitting: Hematology and Oncology

## 2016-07-13 ENCOUNTER — Encounter (HOSPITAL_COMMUNITY): Payer: Self-pay

## 2016-07-13 DIAGNOSIS — C3490 Malignant neoplasm of unspecified part of unspecified bronchus or lung: Secondary | ICD-10-CM | POA: Diagnosis present

## 2016-07-13 DIAGNOSIS — Z79899 Other long term (current) drug therapy: Secondary | ICD-10-CM | POA: Diagnosis not present

## 2016-07-13 DIAGNOSIS — C7931 Secondary malignant neoplasm of brain: Secondary | ICD-10-CM | POA: Diagnosis present

## 2016-07-13 DIAGNOSIS — F17210 Nicotine dependence, cigarettes, uncomplicated: Secondary | ICD-10-CM | POA: Diagnosis present

## 2016-07-13 DIAGNOSIS — R229 Localized swelling, mass and lump, unspecified: Secondary | ICD-10-CM

## 2016-07-13 DIAGNOSIS — G4700 Insomnia, unspecified: Secondary | ICD-10-CM | POA: Diagnosis not present

## 2016-07-13 DIAGNOSIS — I2510 Atherosclerotic heart disease of native coronary artery without angina pectoris: Secondary | ICD-10-CM | POA: Diagnosis present

## 2016-07-13 DIAGNOSIS — M545 Low back pain: Secondary | ICD-10-CM | POA: Diagnosis not present

## 2016-07-13 DIAGNOSIS — C7970 Secondary malignant neoplasm of unspecified adrenal gland: Secondary | ICD-10-CM | POA: Diagnosis present

## 2016-07-13 DIAGNOSIS — D72829 Elevated white blood cell count, unspecified: Secondary | ICD-10-CM

## 2016-07-13 DIAGNOSIS — R530 Neoplastic (malignant) related fatigue: Secondary | ICD-10-CM | POA: Diagnosis not present

## 2016-07-13 DIAGNOSIS — M810 Age-related osteoporosis without current pathological fracture: Secondary | ICD-10-CM | POA: Diagnosis present

## 2016-07-13 DIAGNOSIS — Z955 Presence of coronary angioplasty implant and graft: Secondary | ICD-10-CM

## 2016-07-13 DIAGNOSIS — Z95828 Presence of other vascular implants and grafts: Secondary | ICD-10-CM

## 2016-07-13 DIAGNOSIS — M79661 Pain in right lower leg: Secondary | ICD-10-CM | POA: Diagnosis not present

## 2016-07-13 DIAGNOSIS — R609 Edema, unspecified: Secondary | ICD-10-CM | POA: Diagnosis not present

## 2016-07-13 DIAGNOSIS — M8458XA Pathological fracture in neoplastic disease, other specified site, initial encounter for fracture: Secondary | ICD-10-CM | POA: Diagnosis present

## 2016-07-13 DIAGNOSIS — D496 Neoplasm of unspecified behavior of brain: Secondary | ICD-10-CM

## 2016-07-13 DIAGNOSIS — G893 Neoplasm related pain (acute) (chronic): Secondary | ICD-10-CM | POA: Diagnosis present

## 2016-07-13 DIAGNOSIS — C7951 Secondary malignant neoplasm of bone: Secondary | ICD-10-CM | POA: Diagnosis present

## 2016-07-13 DIAGNOSIS — I252 Old myocardial infarction: Secondary | ICD-10-CM | POA: Diagnosis not present

## 2016-07-13 DIAGNOSIS — M549 Dorsalgia, unspecified: Secondary | ICD-10-CM | POA: Diagnosis not present

## 2016-07-13 DIAGNOSIS — Z7982 Long term (current) use of aspirin: Secondary | ICD-10-CM | POA: Diagnosis not present

## 2016-07-13 DIAGNOSIS — C801 Malignant (primary) neoplasm, unspecified: Secondary | ICD-10-CM | POA: Diagnosis not present

## 2016-07-13 DIAGNOSIS — T402X5A Adverse effect of other opioids, initial encounter: Secondary | ICD-10-CM | POA: Diagnosis present

## 2016-07-13 DIAGNOSIS — Z7902 Long term (current) use of antithrombotics/antiplatelets: Secondary | ICD-10-CM | POA: Diagnosis not present

## 2016-07-13 DIAGNOSIS — C787 Secondary malignant neoplasm of liver and intrahepatic bile duct: Secondary | ICD-10-CM | POA: Diagnosis present

## 2016-07-13 DIAGNOSIS — M79662 Pain in left lower leg: Secondary | ICD-10-CM | POA: Diagnosis not present

## 2016-07-13 DIAGNOSIS — Z96642 Presence of left artificial hip joint: Secondary | ICD-10-CM | POA: Diagnosis present

## 2016-07-13 DIAGNOSIS — Z8572 Personal history of non-Hodgkin lymphomas: Secondary | ICD-10-CM

## 2016-07-13 DIAGNOSIS — R740 Nonspecific elevation of levels of transaminase and lactic acid dehydrogenase [LDH]: Secondary | ICD-10-CM | POA: Diagnosis not present

## 2016-07-13 DIAGNOSIS — K640 First degree hemorrhoids: Secondary | ICD-10-CM

## 2016-07-13 DIAGNOSIS — Z8249 Family history of ischemic heart disease and other diseases of the circulatory system: Secondary | ICD-10-CM

## 2016-07-13 DIAGNOSIS — Z66 Do not resuscitate: Secondary | ICD-10-CM | POA: Diagnosis present

## 2016-07-13 DIAGNOSIS — K649 Unspecified hemorrhoids: Secondary | ICD-10-CM | POA: Diagnosis present

## 2016-07-13 DIAGNOSIS — C792 Secondary malignant neoplasm of skin: Secondary | ICD-10-CM | POA: Diagnosis present

## 2016-07-13 DIAGNOSIS — R531 Weakness: Secondary | ICD-10-CM | POA: Diagnosis present

## 2016-07-13 DIAGNOSIS — Z801 Family history of malignant neoplasm of trachea, bronchus and lung: Secondary | ICD-10-CM

## 2016-07-13 DIAGNOSIS — J449 Chronic obstructive pulmonary disease, unspecified: Secondary | ICD-10-CM | POA: Diagnosis present

## 2016-07-13 DIAGNOSIS — R05 Cough: Secondary | ICD-10-CM | POA: Diagnosis not present

## 2016-07-13 DIAGNOSIS — I10 Essential (primary) hypertension: Secondary | ICD-10-CM | POA: Diagnosis present

## 2016-07-13 DIAGNOSIS — I739 Peripheral vascular disease, unspecified: Secondary | ICD-10-CM

## 2016-07-13 DIAGNOSIS — Z86718 Personal history of other venous thrombosis and embolism: Secondary | ICD-10-CM

## 2016-07-13 DIAGNOSIS — M5441 Lumbago with sciatica, right side: Secondary | ICD-10-CM

## 2016-07-13 DIAGNOSIS — C7989 Secondary malignant neoplasm of other specified sites: Secondary | ICD-10-CM | POA: Diagnosis not present

## 2016-07-13 DIAGNOSIS — Z9181 History of falling: Secondary | ICD-10-CM

## 2016-07-13 DIAGNOSIS — C8590 Non-Hodgkin lymphoma, unspecified, unspecified site: Secondary | ICD-10-CM

## 2016-07-13 DIAGNOSIS — K5900 Constipation, unspecified: Secondary | ICD-10-CM | POA: Diagnosis present

## 2016-07-13 DIAGNOSIS — K5909 Other constipation: Secondary | ICD-10-CM

## 2016-07-13 DIAGNOSIS — Z515 Encounter for palliative care: Secondary | ICD-10-CM | POA: Diagnosis not present

## 2016-07-13 DIAGNOSIS — M5442 Lumbago with sciatica, left side: Secondary | ICD-10-CM

## 2016-07-13 DIAGNOSIS — S32050A Wedge compression fracture of fifth lumbar vertebra, initial encounter for closed fracture: Secondary | ICD-10-CM | POA: Diagnosis present

## 2016-07-13 LAB — COMPREHENSIVE METABOLIC PANEL
ALBUMIN: 3.5 g/dL (ref 3.5–5.0)
ALT: 13 U/L (ref 0–55)
ANION GAP: 13 meq/L — AB (ref 3–11)
AST: 29 U/L (ref 5–34)
Alkaline Phosphatase: 160 U/L — ABNORMAL HIGH (ref 40–150)
BILIRUBIN TOTAL: 0.3 mg/dL (ref 0.20–1.20)
BUN: 17 mg/dL (ref 7.0–26.0)
CO2: 25 mEq/L (ref 22–29)
Calcium: 10.6 mg/dL — ABNORMAL HIGH (ref 8.4–10.4)
Chloride: 107 mEq/L (ref 98–109)
Creatinine: 0.8 mg/dL (ref 0.6–1.1)
EGFR: 70 mL/min/{1.73_m2} — AB (ref >90)
GLUCOSE: 106 mg/dL (ref 70–140)
Potassium: 3.8 mEq/L (ref 3.5–5.1)
Sodium: 145 mEq/L (ref 136–145)
TOTAL PROTEIN: 7 g/dL (ref 6.4–8.3)

## 2016-07-13 LAB — CBC WITH DIFFERENTIAL/PLATELET
BASO%: 0.2 % (ref 0.0–2.0)
Basophils Absolute: 0 10*3/uL (ref 0.0–0.1)
EOS%: 1.2 % (ref 0.0–7.0)
Eosinophils Absolute: 0.2 10*3/uL (ref 0.0–0.5)
HEMATOCRIT: 36.8 % (ref 34.8–46.6)
HGB: 11.7 g/dL (ref 11.6–15.9)
LYMPH#: 2.3 10*3/uL (ref 0.9–3.3)
LYMPH%: 19.2 % (ref 14.0–49.7)
MCH: 29.2 pg (ref 25.1–34.0)
MCHC: 31.8 g/dL (ref 31.5–36.0)
MCV: 91.8 fL (ref 79.5–101.0)
MONO#: 1 10*3/uL — ABNORMAL HIGH (ref 0.1–0.9)
MONO%: 8.1 % (ref 0.0–14.0)
NEUT#: 8.6 10*3/uL — ABNORMAL HIGH (ref 1.5–6.5)
NEUT%: 71.3 % (ref 38.4–76.8)
Platelets: 255 10*3/uL (ref 145–400)
RBC: 4.01 10*6/uL (ref 3.70–5.45)
RDW: 15 % — AB (ref 11.2–14.5)
WBC: 12 10*3/uL — ABNORMAL HIGH (ref 3.9–10.3)

## 2016-07-13 LAB — LACTATE DEHYDROGENASE: LDH: 450 U/L — ABNORMAL HIGH (ref 125–245)

## 2016-07-13 MED ORDER — SODIUM CHLORIDE 0.9 % IV SOLN
INTRAVENOUS | Status: AC
  Administered 2016-07-13 – 2016-07-15 (×3): via INTRAVENOUS
  Administered 2016-07-16: 500 mL via INTRAVENOUS
  Administered 2016-07-17: 1000 mL via INTRAVENOUS

## 2016-07-13 MED ORDER — NITROGLYCERIN 0.4 MG SL SUBL: 0.4000 mg | SUBLINGUAL_TABLET | SUBLINGUAL | Status: DC | PRN

## 2016-07-13 MED ORDER — METOPROLOL TARTRATE 50 MG PO TABS
50.0000 mg | ORAL_TABLET | Freq: Two times a day (BID) | ORAL | Status: DC
  Administered 2016-07-13 – 2016-07-24 (×22): 50 mg via ORAL
  Filled 2016-07-13 (×22): qty 1

## 2016-07-13 MED ORDER — ONDANSETRON 4 MG PO TBDP
4.0000 mg | ORAL_TABLET | Freq: Three times a day (TID) | ORAL | Status: DC | PRN
  Administered 2016-07-16: 8 mg via ORAL
  Filled 2016-07-13: qty 2

## 2016-07-13 MED ORDER — ONDANSETRON HCL 4 MG PO TABS: 4.0000 mg | ORAL_TABLET | Freq: Three times a day (TID) | ORAL | Status: DC | PRN

## 2016-07-13 MED ORDER — ASPIRIN EC 81 MG PO TBEC
81.0000 mg | DELAYED_RELEASE_TABLET | Freq: Every day | ORAL | Status: DC
  Administered 2016-07-14 – 2016-07-24 (×11): 81 mg via ORAL
  Filled 2016-07-13 (×11): qty 1

## 2016-07-13 MED ORDER — ENSURE ENLIVE PO LIQD
237.0000 mL | Freq: Two times a day (BID) | ORAL | Status: DC
  Administered 2016-07-16 – 2016-07-24 (×9): 237 mL via ORAL

## 2016-07-13 MED ORDER — SODIUM CHLORIDE 0.9 % IV SOLN
8.0000 mg | Freq: Three times a day (TID) | INTRAVENOUS | Status: DC | PRN
  Filled 2016-07-13: qty 4

## 2016-07-13 MED ORDER — MAGNESIUM OXIDE 400 (241.3 MG) MG PO TABS
400.0000 mg | ORAL_TABLET | Freq: Two times a day (BID) | ORAL | Status: DC
  Administered 2016-07-14 – 2016-07-24 (×21): 400 mg via ORAL
  Filled 2016-07-13 (×21): qty 1

## 2016-07-13 MED ORDER — HYDROMORPHONE HCL 2 MG/ML IJ SOLN
1.0000 mg | INTRAMUSCULAR | Status: DC | PRN
  Administered 2016-07-13 – 2016-07-17 (×9): 1 mg via INTRAVENOUS
  Filled 2016-07-13 (×9): qty 1

## 2016-07-13 MED ORDER — SENNOSIDES-DOCUSATE SODIUM 8.6-50 MG PO TABS
1.0000 | ORAL_TABLET | Freq: Two times a day (BID) | ORAL | Status: DC
  Administered 2016-07-15 – 2016-07-24 (×18): 1 via ORAL
  Filled 2016-07-13 (×21): qty 1

## 2016-07-13 MED ORDER — ENOXAPARIN SODIUM 40 MG/0.4ML ~~LOC~~ SOLN
40.0000 mg | SUBCUTANEOUS | Status: DC
  Administered 2016-07-13 – 2016-07-15 (×3): 40 mg via SUBCUTANEOUS
  Filled 2016-07-13 (×3): qty 0.4

## 2016-07-13 MED ORDER — HYDROMORPHONE HCL 2 MG PO TABS
2.0000 mg | ORAL_TABLET | ORAL | Status: DC | PRN
  Administered 2016-07-14 – 2016-07-17 (×5): 2 mg via ORAL
  Filled 2016-07-13 (×6): qty 1

## 2016-07-13 MED ORDER — AMLODIPINE BESYLATE 10 MG PO TABS
10.0000 mg | ORAL_TABLET | Freq: Every day | ORAL | Status: DC
  Administered 2016-07-14 – 2016-07-18 (×5): 10 mg via ORAL
  Filled 2016-07-13 (×5): qty 1

## 2016-07-13 MED ORDER — ADULT MULTIVITAMIN W/MINERALS CH
1.0000 | ORAL_TABLET | Freq: Every day | ORAL | Status: DC
  Administered 2016-07-14 – 2016-07-24 (×11): 1 via ORAL
  Filled 2016-07-13 (×11): qty 1

## 2016-07-13 MED ORDER — ACETAMINOPHEN 325 MG PO TABS: 650.0000 mg | ORAL_TABLET | ORAL | Status: DC | PRN

## 2016-07-13 MED ORDER — ACETAMINOPHEN 500 MG PO TABS: 500.0000 mg | ORAL_TABLET | Freq: Four times a day (QID) | ORAL | Status: DC | PRN

## 2016-07-13 MED ORDER — ONDANSETRON HCL 4 MG/2ML IJ SOLN
4.0000 mg | Freq: Three times a day (TID) | INTRAMUSCULAR | Status: DC | PRN
  Administered 2016-07-13 – 2016-07-14 (×2): 4 mg via INTRAVENOUS
  Filled 2016-07-13 (×2): qty 2

## 2016-07-13 MED ORDER — ATORVASTATIN CALCIUM 40 MG PO TABS
80.0000 mg | ORAL_TABLET | Freq: Every day | ORAL | Status: DC
  Administered 2016-07-13 – 2016-07-24 (×12): 80 mg via ORAL
  Filled 2016-07-13 (×12): qty 2

## 2016-07-13 NOTE — Progress Notes (Signed)
This encounter was created in error - please disregard.

## 2016-07-13 NOTE — H&P (Signed)
[place] ADMISSION NOTE  Patient Care Team: No Pcp Per Patient as PCP - General (General Practice) [name], MD as Consulting Physician (Hematology and Oncology)  CHIEF COMPLAINTS/PURPOSE OF ADMISSION Severe, uncontrolled back pain, hypercalcemia with elevated LDH, on background history of lymphoma, concern for cancer recurrence  HISTORY OF PRESENTING ILLNESS:  [name] 76 y.o. female is admitted for management of hypercalcemia and severe uncontrolled back pain. Summary of oncologic history as follows: She has history of stage III symptomatic non-Hodgkin's lymphoma; centroblastic variant follicular lymphoma; FB51 positive, CD79a positive, CD10 and CD23 faintly positive. Follicular international prognostic index score of 4, which was high risk.   PAST THERAPY: s/p 6 cycles of CHOP-Rituxan (first cycle was without Rituxan). Last cycle given on 10/21/09.  She was on maintenance Rituxan q 2 months x 2 years between 11/2009 and 10/2011.   The patient is seen today with her daughter present. The patient may have sustained a fall 3 weeks ago when she was out in the garden although it was unwitnessed.  She felt she might have twisted her back and the circumstances of the fall is unknown. She did not see a primary care doctor for further evaluation. Since then, she has significant pain in all positions.  She has tried heating pads, topical patch, and Tylenol only. She rated her pain at 10 out of 10. She had pain starting in the lower middle of her back radiating down to both legs.  Both legs are weak. She had a remote history of hip fracture in the past and is concerned she might have refractured her hip again. Incidentally, she is also noted to have progressive weight loss, anorexia and evidence of recurrent hypercalcemia. Her blood work today show elevated LDH, raising suspicion that she may have recurrence of lymphoma  She denies recent nausea.  She had mild  constipation.  She denies focal neurological deficits. She is being admitted to the hospital for management of uncontrolled back pain, workup for possible recurrence of lymphoma and also acute management of hypercalcemia, likely related to malignancy.  MEDICAL HISTORY:  Past Medical History:  Diagnosis Date  . Atherosclerosis of native arteries of the extremities with ulceration(440.23)   . CAD (coronary artery disease)   . DVT (deep venous thrombosis) ([place])   . 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 Hypertension   . Myocardial infarction   . Neuromuscular disorder ([place]) 02/2009  . Osteoporosis 08/17/2011    SURGICAL HISTORY: Past Surgical History:  Procedure Laterality Date  . ANGIOPLASTY / STENTING FEMORAL  08/28/10   Left distal anastomosis, angioplasty w/stent by Dr. Adele Barthel  . APPENDECTOMY    . CHOLECYSTECTOMY     Gall bladder  . CORONARY ANGIOPLASTY WITH STENT PLACEMENT    . FEMORAL-POPLITEAL BYPASS GRAFT  02/15/10   Left CFA to above knee popliteal BPG by Dr. Adele Barthel  . FEMUR IM NAIL  04/05/2011   Procedure: INTRAMEDULLARY (IM) NAIL FEMORAL;  Surgeon: Nita Sells, MD;  Location: [facility];  Service: Orthopedics;  Laterality: Left;  Left hip trochanteric nail  . TOE AMPUTATION  03/13/10   Left Great toe by Dr. Adele Barthel  . TONSILLECTOMY      SOCIAL HISTORY: Social History   Social History  . Marital status: Divorced    Spouse name: N/A  . Number of children: N/A  . Years of education: N/A   Occupational History  .  Not on file.   Social History Main Topics  . Smoking status: Current Every Day Smoker    Packs/day: 1.00    Years: 50.00    Types: Cigarettes    Last attempt to quit: 02/25/2009  . Smokeless tobacco: Never Used     Comment: smoking ~4 cigarettes/day-03/23/16-gwd  . Alcohol use No  . Drug use: No  . Sexual activity: Not on file   Other Topics Concern  . Not  on file   Social History Narrative  . No narrative on file    FAMILY HISTORY: Family History  Problem Relation Age of Onset  . Heart attack Father   . Cancer Brother     lung ca    ALLERGIES:  has No Known Allergies.  MEDICATIONS:  Current Facility-Administered Medications  Medication Dose Route Frequency Provider Last Rate Last Dose  . 0.9 %  sodium chloride infusion   Intravenous Continuous [name], MD      . acetaminophen (TYLENOL) tablet 500 mg  500 mg Oral Q6H PRN [name], MD      . acetaminophen (TYLENOL) tablet 650 mg  650 mg Oral Q4H PRN [name], MD      . amLODipine (NORVASC) tablet 10 mg  10 mg Oral Daily [name], MD      . aspirin tablet 81 mg  81 mg Oral Daily Murdis Flitton, MD      . atorvastatin (LIPITOR) tablet 80 mg  80 mg Oral Daily Anabel Lykins, MD      . enoxaparin (LOVENOX) injection 40 mg  40 mg Subcutaneous Q24H Fausto Sampedro, MD      . HYDROmorphone (DILAUDID) injection 1 mg  1 mg Intravenous Q2H PRN [name], MD      . HYDROmorphone (DILAUDID) tablet 2 mg  2 mg Oral Q2H PRN [name], MD      . magnesium oxide (MAG-OX) tablet 400 mg  400 mg Oral BID [name], MD      . metoprolol (LOPRESSOR) tablet 50 mg  50 mg Oral BID [name], MD      . multivitamin with minerals tablet 1 tablet  1 tablet Oral Daily Elfrida Pixley, MD      . nitroGLYCERIN (NITROSTAT) SL tablet 0.4 mg  0.4 mg Sublingual Q5 min PRN [name], MD      . ondansetron (ZOFRAN) tablet 4-8 mg  4-8 mg Oral Q8H PRN [name], MD       Or  . ondansetron (ZOFRAN-ODT) disintegrating tablet 4-8 mg  4-8 mg Oral Q8H PRN [name], MD       Or  . ondansetron (ZOFRAN) injection 4 mg  4 mg Intravenous Q8H PRN [name], MD       Or  . ondansetron (ZOFRAN) 8 mg in sodium chloride 0.9 % 50 mL IVPB  8 mg Intravenous Q8H PRN [name], MD      . senna-docusate (Senokot-S) tablet 1 tablet  1 tablet Oral BID [name], MD        REVIEW OF SYSTEMS:   Constitutional: Denies fevers, chills or  abnormal night sweats Eyes: Denies blurriness of vision, double vision or watery eyes Ears, nose, mouth, throat, and face: Denies mucositis or sore throat Respiratory: Denies cough, dyspnea or wheezes Cardiovascular: Denies palpitation, chest discomfort or lower extremity swelling Gastrointestinal:  Denies nausea, heartburn or change in bowel habits Skin: Denies abnormal skin rashes Lymphatics: Denies new lymphadenopathy or easy bruising Behavioral/Psych: Mood is stable, no new changes  All other systems were reviewed with the patient and are negative.  PHYSICAL EXAMINATION: ECOG PERFORMANCE STATUS: 2 - Symptomatic, <50% confined to bed Blood pressure 152/66 Heart rate 81 Temperature 98.58F and heart rate is 99 GENERAL:alert, no distress and comfortable.  She looks frail and mildly cachectic SKIN: skin color, texture, turgor are normal, no rashes or significant lesions EYES: normal, conjunctiva are pink and non-injected, sclera clear OROPHARYNX:no exudate, no erythema and lips, buccal mucosa, and tongue normal  NECK: supple, thyroid normal size, non-tender, without nodularity LYMPH:  no palpable lymphadenopathy in the cervical, axillary or inguinal LUNGS: clear to auscultation and percussion with normal breathing effort HEART: regular rate & rhythm and no murmurs and no lower extremity edema ABDOMEN:abdomen soft, non-tender and normal bowel sounds Musculoskeletal:no cyanosis of digits and no clubbing.  She have significant pain on gentle palpation of her lower back radiating down to both legs.  PSYCH: alert & oriented x 3 with fluent speech NEURO: no focal motor/sensory deficits.  Limited examination due to severe, uncontrolled pain  LABORATORY DATA:  I have reviewed the data as listed Lab Results  Component Value Date   WBC 12.0 (H) 07/13/2016   HGB 11.7 07/13/2016   HCT 36.8 07/13/2016   MCV 91.8 07/13/2016   PLT 255 07/13/2016    Recent Labs  03/23/16 1215 07/13/16 1439   NA 144 145  K 4.1 3.8  CO2 29 25  GLUCOSE 98 106  BUN 11.4 17.0  CREATININE 0.9 0.8  CALCIUM 10.5* 10.6*  PROT 7.1 7.0  ALBUMIN 3.6 3.5  AST 19 29  ALT 14 13  ALKPHOS 134 160*  BILITOT 0.36 0.30    ASSESSMENT & PLAN:   Severe, uncontrolled pain This could be related to recent fall or cancer associated pain I will start her on low-dose Dilaudid p.o. and IV Dilaudid as needed If I can get her pain better control, I will order imaging study tomorrow.  Recent fall History of left hip replacement surgery I will order x-ray of her hip tomorrow once her pain is better controlled  Hypercalcemia, worrisome for malignancy Elevated LDH History of lymphoma Mild leukocytosis I am concerned for possible recurrence of lymphoma I cannot complete outpatient workup due to her issue with severe, uncontrolled pain and recent fall As soon as I can rule out a hip fracture, I plan to order CT scan of the chest, abdomen and pelvis with IV contrast to rule out recurrence of lymphoma  CODE STATUS Full code  History of coronary artery disease status post myocardial infarction I will  continue all her home medications except for Plavix, in case surgical intervention is needed  Remote history of DVT We will start her on Lovenox for DVT prophylaxis, especially due to her poor mobility  Severe weakness She has been debilitated for over 3 weeks As soon as I can get her pain under control and fracture is ruled out, I will consult physical therapy next week  Discharge planning She is admitted due to failure of outpatient management, 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 She will likely be in the hospital for at least 2-3 days for plans above.  All questions were answered. The patient knows to call the clinic with any problems, questions or concerns.    [name], MD 07/13/2016 3:45 PM

## 2016-07-14 ENCOUNTER — Encounter (HOSPITAL_COMMUNITY): Payer: Self-pay | Admitting: Radiology

## 2016-07-14 ENCOUNTER — Inpatient Hospital Stay (HOSPITAL_COMMUNITY): Payer: Medicare Other

## 2016-07-14 MED ORDER — SODIUM CHLORIDE 0.9 % IJ SOLN
INTRAMUSCULAR | Status: AC
  Filled 2016-07-14: qty 50

## 2016-07-14 MED ORDER — IOPAMIDOL (ISOVUE-300) INJECTION 61%
30.0000 mL | Freq: Once | INTRAVENOUS | Status: AC | PRN
Start: 2016-07-14 — End: 2016-07-14
  Administered 2016-07-14: 30 mL via ORAL

## 2016-07-14 MED ORDER — IOPAMIDOL (ISOVUE-300) INJECTION 61%
100.0000 mL | Freq: Once | INTRAVENOUS | Status: AC | PRN
  Administered 2016-07-14: 100 mL via INTRAVENOUS

## 2016-07-14 MED ORDER — IOPAMIDOL (ISOVUE-300) INJECTION 61%
INTRAVENOUS | Status: AC
  Filled 2016-07-14: qty 100

## 2016-07-14 MED ORDER — PROCHLORPERAZINE EDISYLATE 5 MG/ML IJ SOLN
10.0000 mg | Freq: Four times a day (QID) | INTRAMUSCULAR | Status: DC | PRN
  Administered 2016-07-14: 10 mg via INTRAVENOUS
  Filled 2016-07-14 (×2): qty 2

## 2016-07-14 NOTE — Progress Notes (Signed)
Nursing Note: Paged on-call and talked w/ Dr.Magrinat r/t results of CT-and L5 compression fx and new lung nodules.MD will round on pt in am.wbb

## 2016-07-14 NOTE — Progress Notes (Signed)
[name]   DOB:16-Jun-1940   OM#:[id number]    Subjective: She had received some IV hydration and pain medicine yesterday.  Her symptoms are slightly better control.  She is still not able to walk or stand more than 10 seconds due to severe pain.  Denies new neurological deficit  Objective:  Vitals:   07/13/16 2153 07/14/16 0516  BP: 136/83 131/65  Pulse: 97 89  Resp: 16 18  Temp: 99 F (37.2 C) 99.6 F (37.6 C)     Intake/Output Summary (Last 24 hours) at 07/14/16 1138 Last data filed at 07/14/16 1000  Gross per 24 hour  Intake             1280 ml  Output                0 ml  Net             1280 ml    GENERAL:alert, no distress and comfortable SKIN: skin color, texture, turgor are normal, no rashes or significant lesions EYES: normal, Conjunctiva are pink and non-injected, sclera clear OROPHARYNX:no exudate, no erythema and lips, buccal mucosa, and tongue normal  NECK: supple, thyroid normal size, non-tender, without nodularity LYMPH:  no palpable lymphadenopathy in the cervical, axillary or inguinal LUNGS: clear to auscultation and percussion with normal breathing effort HEART: regular rate & rhythm and no murmurs and no lower extremity edema ABDOMEN:abdomen soft, non-tender and normal bowel sounds Musculoskeletal:no cyanosis of digits and no clubbing  NEURO: alert & oriented x 3 with fluent speech, no focal motor/sensory deficits   Labs:  Lab Results  Component Value Date   WBC 12.0 (H) 07/13/2016   HGB 11.7 07/13/2016   HCT 36.8 07/13/2016   MCV 91.8 07/13/2016   PLT 255 07/13/2016   NEUTROABS 8.6 (H) 07/13/2016    Lab Results  Component Value Date   NA 145 07/13/2016   K 3.8 07/13/2016   CL 104 04/29/2015   CO2 25 07/13/2016    Assessment & Plan:   Severe, uncontrolled pain This could be related to recent fall or cancer associated pain I will continue on low-dose Dilaudid p.o. and IV Dilaudid as needed I plan to order imaging study with CT scan  of the chest, abdomen and pelvis with IV contrast was further assessment  Recent fall History of left hip replacement surgery Awaiting imaging study as above  Hypercalcemia, worrisome for malignancy Elevated LDH History of lymphoma Mild leukocytosis I am concerned for possible recurrence of lymphoma I cannot complete outpatient workup due to her issue with severe, uncontrolled pain and recent fall I plan to order CT scan of the chest, abdomen and pelvis with IV contrast to rule out recurrence of lymphoma  CODE STATUS Full code  History of coronary artery disease status post myocardial infarction I will continue all her home medications except for Plavix, in case surgical intervention is needed  Remote history of DVT We will start her on Lovenox for DVT prophylaxis, especially due to her poor mobility  Severe weakness She has been debilitated for over 3 weeks As soon as I can get her pain under control and fracture is ruled out, I will consult physical therapy next week  Discharge planning She is admitted due to failure of outpatient management, 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 She will likely be in the hospital for at least 2-3 days for plans above  [name], MD 07/14/2016  11:38 AM

## 2016-07-15 DIAGNOSIS — R05 Cough: Secondary | ICD-10-CM

## 2016-07-15 LAB — CBC WITH DIFFERENTIAL/PLATELET
BASOS ABS: 0 10*3/uL (ref 0.0–0.1)
BASOS PCT: 0 %
Eosinophils Absolute: 0.3 10*3/uL (ref 0.0–0.7)
Eosinophils Relative: 2 %
HCT: 31.9 % — ABNORMAL LOW (ref 36.0–46.0)
Hemoglobin: 10.1 g/dL — ABNORMAL LOW (ref 12.0–15.0)
Lymphocytes Relative: 21 %
Lymphs Abs: 2.5 10*3/uL (ref 0.7–4.0)
MCH: 29.2 pg (ref 26.0–34.0)
MCHC: 31.7 g/dL (ref 30.0–36.0)
MCV: 92.2 fL (ref 78.0–100.0)
MONO ABS: 1.2 10*3/uL — AB (ref 0.1–1.0)
Monocytes Relative: 10 %
Neutro Abs: 8 10*3/uL — ABNORMAL HIGH (ref 1.7–7.7)
Neutrophils Relative %: 67 %
PLATELETS: 232 10*3/uL (ref 150–400)
RBC: 3.46 MIL/uL — ABNORMAL LOW (ref 3.87–5.11)
RDW: 15.4 % (ref 11.5–15.5)
WBC: 12 10*3/uL — ABNORMAL HIGH (ref 4.0–10.5)

## 2016-07-15 LAB — COMPREHENSIVE METABOLIC PANEL
ALBUMIN: 3 g/dL — AB (ref 3.5–5.0)
ALT: 17 U/L (ref 14–54)
ANION GAP: 6 (ref 5–15)
AST: 41 U/L (ref 15–41)
Alkaline Phosphatase: 103 U/L (ref 38–126)
BILIRUBIN TOTAL: 0.7 mg/dL (ref 0.3–1.2)
BUN: 17 mg/dL (ref 6–20)
CO2: 27 mmol/L (ref 22–32)
Calcium: 8.8 mg/dL — ABNORMAL LOW (ref 8.9–10.3)
Chloride: 107 mmol/L (ref 101–111)
Creatinine, Ser: 0.96 mg/dL (ref 0.44–1.00)
GFR calc Af Amer: >60 mL/min (ref >60)
GFR, EST NON AFRICAN AMERICAN: 56 mL/min — AB (ref >60)
Glucose, Bld: 99 mg/dL (ref 65–99)
POTASSIUM: 4.1 mmol/L (ref 3.5–5.1)
Sodium: 140 mmol/L (ref 135–145)
TOTAL PROTEIN: 5.9 g/dL — AB (ref 6.5–8.1)

## 2016-07-15 MED ORDER — IPRATROPIUM-ALBUTEROL 0.5-2.5 (3) MG/3ML IN SOLN
3.0000 mL | Freq: Four times a day (QID) | RESPIRATORY_TRACT | Status: DC
  Administered 2016-07-15: 3 mL via RESPIRATORY_TRACT
  Filled 2016-07-15: qty 3

## 2016-07-15 MED ORDER — IPRATROPIUM-ALBUTEROL 0.5-2.5 (3) MG/3ML IN SOLN
3.0000 mL | Freq: Two times a day (BID) | RESPIRATORY_TRACT | Status: DC
  Administered 2016-07-16 – 2016-07-18 (×6): 3 mL via RESPIRATORY_TRACT
  Filled 2016-07-15 (×6): qty 3

## 2016-07-15 NOTE — Progress Notes (Signed)
Initial Nutrition Assessment  DOCUMENTATION CODES:   Not applicable  INTERVENTION:   Ensure Enlive po BID, each supplement provides 350 kcal and 20 grams of protein  MVI  Snacks  NUTRITION DIAGNOSIS:   Increased nutrient needs related to cancer and cancer related treatments, other (see comment) (COPD) as evidenced by increased estimated needs from protein.  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 Pt reports fair appetite pta and currently. Pt eating 75% meals. Per chart, pt has lost 6lbs(4%) over 4 months which is not significant. Pt with h/o lymphoma and chemotherapy. Pt is very aware of the importance of adequate protein intake and did well during her previous cancer treatments. Pt will order Ensure and snacks.   Medications reviewed and include: aspirin, lovenox, Mg oxide, MVI, senokot, hydromorphone   Labs reviewed: Ca 8.8(L) adj. 9.6 wnl, Alb 3.0(L) Wbc- 12(H)  Nutrition-Focused physical exam completed. Findings are no fat depletion, moderate muscle depletion in temporal regions, and no edema.   Diet Order:  Diet regular Room service appropriate? Yes; Fluid consistency: Thin  Skin:  Reviewed, no issues  Last BM:  2/16  Height:   Ht Readings from Last 1 Encounters:  07/13/16 _0  (1.651 m)    Weight:   Wt Readings from Last 1 Encounters:  07/13/16 150 lb (68 kg)    Ideal Body Weight:  56.8 kg  BMI:  Body mass index is 24.96 kg/m.  Estimated Nutritional Needs:   Kcal:  1500-1800kcal/day   Protein:  88-102g/day   Fluid:  >1.5L/day   EDUCATION NEEDS:   No education needs identified at  this time  Koleen Distance, RD, LDN Pager #[phone]

## 2016-07-15 NOTE — Progress Notes (Signed)
Medicated more often for pain.Pt has pain when getting up to [facility].Pt had more pain last night.wbb

## 2016-07-15 NOTE — Progress Notes (Signed)
[name]   DOB:Apr 09, 1941   OF#:[id number]    Subjective: [name] pain is better controlled.  She only took several doses of pain medicine.  She is not moving around much.  She has mild nonproductive cough.  Objective:  Vitals:   07/14/16 2137 07/15/16 0555  BP: (!) 121/54 (!) 117/59  Pulse: 75 73  Resp: 16 16  Temp: 98.6 F (37 C) 98.8 F (37.1 C)     Intake/Output Summary (Last 24 hours) at 07/15/16 7782 Last data filed at 07/14/16 1430  Gross per 24 hour  Intake              600 ml  Output                0 ml  Net              600 ml    GENERAL:alert, no distress and comfortable SKIN: She has palpable subcutaneous nodule near [name] right scapula EYES: normal, Conjunctiva are pink and non-injected, sclera clear OROPHARYNX:no exudate, no erythema and lips, buccal mucosa, and tongue normal  NECK: supple, thyroid normal size, non-tender, without nodularity LYMPH:  no palpable lymphadenopathy in the cervical, axillary or inguinal LUNGS: clear to auscultation and percussion with normal breathing effort HEART: regular rate & rhythm and no murmurs and no lower extremity edema ABDOMEN:abdomen soft, non-tender and normal bowel sounds Musculoskeletal:no cyanosis of digits and no clubbing  NEURO: alert & oriented x 3 with fluent speech, no focal motor/sensory deficits   Labs:  Lab Results  Component Value Date   WBC 12.0 (H) 07/15/2016   HGB 10.1 (L) 07/15/2016   HCT 31.9 (L) 07/15/2016   MCV 92.2 07/15/2016   PLT 232 07/15/2016   NEUTROABS 8.0 (H) 07/15/2016    Lab Results  Component Value Date   NA 140 07/15/2016   K 4.1 07/15/2016   CL 107 07/15/2016   CO2 27 07/15/2016    Studies: I reviewed imaging study with [name] daughter and the patient Ct Chest W Contrast  Result Date: 07/14/2016 CLINICAL DATA:  Non-Hodgkin's lymphoma. Severe uncontrolled back pain with hypercalcemia and elevated LDH levels. Evaluate for recurrence. EXAM: CT CHEST, ABDOMEN, AND PELVIS WITH  CONTRAST TECHNIQUE: Multidetector CT imaging of the chest, abdomen and pelvis was performed following the standard protocol during bolus administration of intravenous contrast. CONTRAST:  64m ISOVUE-300 IOPAMIDOL (ISOVUE-300) INJECTION 61%, 1048mISOVUE-300 IOPAMIDOL (ISOVUE-300) INJECTION 61% COMPARISON:  Prior CTs 09/12/2012 FINDINGS: CT CHEST FINDINGS Cardiovascular: There is diffuse atherosclerosis of the aorta, great vessels and coronary arteries. No acute vascular findings are seen. The heart size is normal. There is no pericardial effusion. Mediastinum/Nodes: There is new mediastinal and right hilar lymphadenopathy. Right paratracheal node measures 2.9 x 2.4 cm on image 25. There is a subcarinal node measuring 2.6 x 1.6 cm on image 31 and a right hilar node measuring 2.0 x 1.3 cm on image 28. There is a large paramediastinal mass adjacent to the ascending aorta which is probably a pulmonary lesion, described below. The thyroid gland, trachea and esophagus demonstrate no significant findings. Lungs/Pleura: There is no pleural effusion. There is dependent bibasilar atelectasis. As above, there is a large right upper lobe mass abutting the anterior mediastinum. This measures 5.9 x 4.2 cm on image 70. Additional pulmonary nodules are present, including a 1.2 cm right lower lobe nodule on image 106. Some of these are ground-glass, including ill-defined components in the left upper lobe which have progressed compared with the prior  study. Mild underlying emphysema noted. Musculoskeletal/Chest wall: Mild thoracic spine compression deformities are stable. There are no worrisome osseous findings. There is a new soft tissue nodule in the subcutaneous fat of the right upper back, overlying the right scapula. This measures 11 mm on image 16. CT ABDOMEN AND PELVIS FINDINGS Hepatobiliary: There is a new dominant mass in the lateral segment of left hepatic lobe which measures 5.6 x 5.5 cm on image 61. There are additional  smaller lesions within the right lobe measuring 12 mm on image 54. There is a smaller lesion more inferiorly on image 63. There is chronic extrahepatic biliary dilatation status post cholecystectomy which appears similar to the prior study. Pancreas: The pancreas is atrophied. There is no evidence of pancreatic mass, surrounding inflammation or ductal dilatation. Spleen: Normal in size without focal abnormality. Adrenals/Urinary Tract: There is increased nodularity of the left adrenal gland which demonstrates a 1.9 x 1.4 cm nodule on image 62. The right adrenal gland appears normal. There is a new intermediate density nodule in the posterior aspect of the right kidney measuring 1.4 cm on image 22 of series 7. This has an indeterminate soft tissue attenuation. The kidneys otherwise appear stable with small cysts bilaterally. There is some cortical thinning and perinephric soft tissue stranding on the left. No evidence of hydronephrosis or ureteral calculus. The bladder appears unremarkable. Stomach/Bowel: No evidence of bowel wall thickening, distention or surrounding inflammatory change. The stomach is incompletely distended and suboptimally evaluated. Vascular/Lymphatic: There are no enlarged abdominal or pelvic lymph nodes. There is diffuse aortic and branch vessel atherosclerosis. No evidence of large vessel occlusion. Reproductive: The uterus is atrophied.  No evidence of adnexal mass. Other: There is a new left upper quadrant omental mass measuring 2.0 x 2.1 cm on image 71. No ascites or generalized peritoneal nodularity. Musculoskeletal: Interval development of a biconcave compression deformity at L5. No underlying lytic lesion is seen within the vertebral body, although there is a lytic lesion involving the right L4 transverse process, most obvious on the coronal images. Previous left proximal femoral ORIF noted. IMPRESSION: 1. There are multiple new lesions consistent with metastatic disease. There is a  dominant right upper lobe mass with additional bilateral pulmonary nodules, mediastinal and right hilar adenopathy, several hepatic lesions, a new left adrenal nodule, an omental peritoneal implant and at least 1 lytic osseous lesion. Findings could be secondary to recurrent multifocal lymphoma, although pattern is atypical for lymphoma, and primary lung cancer should be considered given the size of the right upper lobe lesion. 2. Additional subcutaneous nodule in the right upper back could reflect a metastasis. 3. Chronic lung disease with associated atelectasis and scattered ground-glass densities. 4. Indeterminate intermediate density lesion in the posterior interpolar region of the right kidney. 5. Age-indeterminate L5 compression fracture. Electronically Signed   By: Richardean Sale M.D.   On: 07/14/2016 11:59   Ct Abdomen Pelvis W Contrast  Result Date: 07/14/2016 CLINICAL DATA:  Non-Hodgkin's lymphoma. Severe uncontrolled back pain with hypercalcemia and elevated LDH levels. Evaluate for recurrence. EXAM: CT CHEST, ABDOMEN, AND PELVIS WITH CONTRAST TECHNIQUE: Multidetector CT imaging of the chest, abdomen and pelvis was performed following the standard protocol during bolus administration of intravenous contrast. CONTRAST:  49m ISOVUE-300 IOPAMIDOL (ISOVUE-300) INJECTION 61%, 1075mISOVUE-300 IOPAMIDOL (ISOVUE-300) INJECTION 61% COMPARISON:  Prior CTs 09/12/2012 FINDINGS: CT CHEST FINDINGS Cardiovascular: There is diffuse atherosclerosis of the aorta, great vessels and coronary arteries. No acute vascular findings are seen. The heart size is normal. There  is no pericardial effusion. Mediastinum/Nodes: There is new mediastinal and right hilar lymphadenopathy. Right paratracheal node measures 2.9 x 2.4 cm on image 25. There is a subcarinal node measuring 2.6 x 1.6 cm on image 31 and a right hilar node measuring 2.0 x 1.3 cm on image 28. There is a large paramediastinal mass adjacent to the ascending aorta  which is probably a pulmonary lesion, described below. The thyroid gland, trachea and esophagus demonstrate no significant findings. Lungs/Pleura: There is no pleural effusion. There is dependent bibasilar atelectasis. As above, there is a large right upper lobe mass abutting the anterior mediastinum. This measures 5.9 x 4.2 cm on image 70. Additional pulmonary nodules are present, including a 1.2 cm right lower lobe nodule on image 106. Some of these are ground-glass, including ill-defined components in the left upper lobe which have progressed compared with the prior study. Mild underlying emphysema noted. Musculoskeletal/Chest wall: Mild thoracic spine compression deformities are stable. There are no worrisome osseous findings. There is a new soft tissue nodule in the subcutaneous fat of the right upper back, overlying the right scapula. This measures 11 mm on image 16. CT ABDOMEN AND PELVIS FINDINGS Hepatobiliary: There is a new dominant mass in the lateral segment of left hepatic lobe which measures 5.6 x 5.5 cm on image 61. There are additional smaller lesions within the right lobe measuring 12 mm on image 54. There is a smaller lesion more inferiorly on image 63. There is chronic extrahepatic biliary dilatation status post cholecystectomy which appears similar to the prior study. Pancreas: The pancreas is atrophied. There is no evidence of pancreatic mass, surrounding inflammation or ductal dilatation. Spleen: Normal in size without focal abnormality. Adrenals/Urinary Tract: There is increased nodularity of the left adrenal gland which demonstrates a 1.9 x 1.4 cm nodule on image 62. The right adrenal gland appears normal. There is a new intermediate density nodule in the posterior aspect of the right kidney measuring 1.4 cm on image 22 of series 7. This has an indeterminate soft tissue attenuation. The kidneys otherwise appear stable with small cysts bilaterally. There is some cortical thinning and  perinephric soft tissue stranding on the left. No evidence of hydronephrosis or ureteral calculus. The bladder appears unremarkable. Stomach/Bowel: No evidence of bowel wall thickening, distention or surrounding inflammatory change. The stomach is incompletely distended and suboptimally evaluated. Vascular/Lymphatic: There are no enlarged abdominal or pelvic lymph nodes. There is diffuse aortic and branch vessel atherosclerosis. No evidence of large vessel occlusion. Reproductive: The uterus is atrophied.  No evidence of adnexal mass. Other: There is a new left upper quadrant omental mass measuring 2.0 x 2.1 cm on image 71. No ascites or generalized peritoneal nodularity. Musculoskeletal: Interval development of a biconcave compression deformity at L5. No underlying lytic lesion is seen within the vertebral body, although there is a lytic lesion involving the right L4 transverse process, most obvious on the coronal images. Previous left proximal femoral ORIF noted. IMPRESSION: 1. There are multiple new lesions consistent with metastatic disease. There is a dominant right upper lobe mass with additional bilateral pulmonary nodules, mediastinal and right hilar adenopathy, several hepatic lesions, a new left adrenal nodule, an omental peritoneal implant and at least 1 lytic osseous lesion. Findings could be secondary to recurrent multifocal lymphoma, although pattern is atypical for lymphoma, and primary lung cancer should be considered given the size of the right upper lobe lesion. 2. Additional subcutaneous nodule in the right upper back could reflect a metastasis. 3. Chronic  lung disease with associated atelectasis and scattered ground-glass densities. 4. Indeterminate intermediate density lesion in the posterior interpolar region of the right kidney. 5. Age-indeterminate L5 compression fracture. Electronically Signed   By: Richardean Sale M.D.   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  The patient had a prior history of smoking. The least invasive site for biopsy would be the subcutaneous nodule. She had taken Plavix, last on 07/13/2016. I would discussed this with interventional radiologist tomorrow.  Given the low risk of bleeding with subcutaneous nodule, it is possible that biopsy could be done even with recent ingestion of Plavix. I would complete staging with MRI of [name] lumbar spine along with MRI of the brain. I will consult radiation oncologist tomorrow for possible radiation treatment to the painful bone lesion that is the cause of [name] admission.  Severe, uncontrolled pain This could be related to recent fall or cancer associated pain I will continue on low-dose Dilaudid p.o. and IV Dilaudid as needed I will order MRI of the lumbar spine to assess for bone lesion further. She may benefit from palliative radiation treatment  Hypercalcemia of malignancy We will continue IV fluid hydration  Leukocytosis Cough I do not believe she have pneumonia. Likely, she have COPD exacerbation and related to compression of [name] airway from cancer I will start [name] on some nebulizer treatment and cough suppressant as needed  CODE STATUS We discussed goals of care. She agreed to change [name] CODE STATUS to DNR  History of coronary artery disease status post myocardial infarction I will continue all [name] home medications except for Plavix, in case surgical intervention is needed  Remote history of DVT We will start [name] on Lovenox for DVT prophylaxis, especially due to [name] poor mobility  Severe weakness She has been debilitated for over 3 weeks As soon as I can get [name] pain under control and fracture is ruled out, I  will consult physical therapy next week  Discharge planning Goals of CARE discussion We will consult palliative care to assist in goals of care discussion and symptom management The patient understood that if this is metastatic lung cancer, treatment would be strictly palliative in nature We discussed CODE STATUS She agreed to change [name] CODE STATUS to DNR   [name], MD 07/15/2016  8:42 AM

## 2016-07-16 ENCOUNTER — Encounter (HOSPITAL_COMMUNITY): Payer: Self-pay | Admitting: *Deleted

## 2016-07-16 ENCOUNTER — Inpatient Hospital Stay (HOSPITAL_COMMUNITY): Payer: Medicare Other

## 2016-07-16 DIAGNOSIS — M549 Dorsalgia, unspecified: Secondary | ICD-10-CM

## 2016-07-16 DIAGNOSIS — Z66 Do not resuscitate: Secondary | ICD-10-CM | POA: Diagnosis present

## 2016-07-16 DIAGNOSIS — S32050A Wedge compression fracture of fifth lumbar vertebra, initial encounter for closed fracture: Secondary | ICD-10-CM | POA: Diagnosis present

## 2016-07-16 DIAGNOSIS — Z515 Encounter for palliative care: Secondary | ICD-10-CM

## 2016-07-16 MED ORDER — ONDANSETRON HCL 4 MG/2ML IJ SOLN
4.0000 mg | Freq: Three times a day (TID) | INTRAMUSCULAR | Status: DC
  Administered 2016-07-16 – 2016-07-19 (×9): 4 mg via INTRAVENOUS
  Filled 2016-07-16 (×9): qty 2

## 2016-07-16 MED ORDER — POLYETHYLENE GLYCOL 3350 17 G PO PACK
17.0000 g | PACK | Freq: Every day | ORAL | Status: DC
  Administered 2016-07-17 – 2016-07-22 (×6): 17 g via ORAL
  Filled 2016-07-16 (×7): qty 1

## 2016-07-16 MED ORDER — BISACODYL 5 MG PO TBEC
5.0000 mg | DELAYED_RELEASE_TABLET | Freq: Every day | ORAL | Status: DC | PRN
  Administered 2016-07-22 – 2016-07-24 (×2): 5 mg via ORAL
  Filled 2016-07-16 (×2): qty 1

## 2016-07-16 MED ORDER — CALCITONIN (SALMON) 200 UNIT/ACT NA SOLN
1.0000 | Freq: Every day | NASAL | Status: DC
  Administered 2016-07-16 – 2016-07-24 (×9): 1 via NASAL
  Filled 2016-07-16: qty 3.7

## 2016-07-16 MED ORDER — ZOLPIDEM TARTRATE 5 MG PO TABS
5.0000 mg | ORAL_TABLET | Freq: Once | ORAL | Status: DC
  Filled 2016-07-16: qty 1

## 2016-07-16 MED ORDER — ENOXAPARIN SODIUM 40 MG/0.4ML ~~LOC~~ SOLN
40.0000 mg | SUBCUTANEOUS | Status: DC
  Administered 2016-07-17 – 2016-07-24 (×8): 40 mg via SUBCUTANEOUS
  Filled 2016-07-16 (×8): qty 0.4

## 2016-07-16 MED ORDER — METOCLOPRAMIDE HCL 5 MG/ML IJ SOLN
5.0000 mg | Freq: Two times a day (BID) | INTRAMUSCULAR | Status: DC
  Administered 2016-07-16 – 2016-07-19 (×7): 5 mg via INTRAVENOUS
  Filled 2016-07-16 (×7): qty 2

## 2016-07-16 MED ORDER — ACETAMINOPHEN 325 MG PO TABS
650.0000 mg | ORAL_TABLET | Freq: Four times a day (QID) | ORAL | Status: DC
  Administered 2016-07-16 – 2016-07-24 (×32): 650 mg via ORAL
  Filled 2016-07-16 (×32): qty 2

## 2016-07-16 NOTE — Evaluation (Signed)
Physical Therapy Evaluation Patient Details Name: [name] MRN: [id number] DOB: Aug 07, 1940 Today's Date: 07/16/2016   History of Present Illness  76 y.o. female  with past medical history of lymphoma admitted on 07/13/2016 with severe low back pain, hypercalcemia and fall at home with reported 3 weeks of pain and functional status decline. was admitted by Dr. Alvy Bimler for symptom management and for recurrent malignancy evaluation. CT of abdomen and chest showed widely metastatic cancer including what may be a new primary lung mass.  Clinical Impression  The patient is  Able  To ambulate with RW x 50'. Oxygen saturation 94 on RA. Daughter reports uncertain about disposition as more results are pending.  The patient is home alone as daughter works.Pt admitted with above diagnosis. Pt currently with functional limitations due to the deficits listed below (see PT Problem List).  Pt will benefit from skilled PT to increase their independence and safety with mobility to allow discharge to the venue listed below.       Follow Up Recommendations  HHPT vs SNF    Equipment Recommendations    none   Recommendations for Other Services   OT   Precautions / Restrictions Precautions Precautions: Fall      Mobility  Bed Mobility Overal bed mobility: Needs Assistance Bed Mobility: Supine to Sit     Supine to sit: Min assist     General bed mobility comments: assist with trunk  Transfers Overall transfer level: Needs assistance Equipment used: Rolling walker (2 wheeled) Transfers: Sit to/from Stand Sit to Stand: Mod assist         General transfer comment: assist to power up from bed and toilet, use of left rail.  Ambulation/Gait Ambulation/Gait assistance: Min assist Ambulation Distance (Feet): 20 Feet (then 50) Assistive device: Rolling walker (2 wheeled) Gait Pattern/deviations: Step-to pattern;Step-through pattern     General Gait Details: slow speed  Stairs             Wheelchair Mobility    Modified Rankin (Stroke Patients Only)       Balance Overall balance assessment: Needs assistance;History of Falls Sitting-balance support: Feet supported;No upper extremity supported Sitting balance-Leahy Scale: Fair     Standing balance support: During functional activity;Bilateral upper extremity supported Standing balance-Leahy Scale: Fair                               Pertinent Vitals/Pain Pain Assessment: No/denies pain    Home Living Family/patient expects to be discharged to:: Private residence Living Arrangements: Children Available Help at Discharge: Family Type of Home: House Home Access: Stairs to enter Entrance Stairs-Rails: Right Entrance Stairs-Number of Steps: 2 Home Layout: One level Home Equipment: Bedside commode;Walker - 2 wheels      Prior Function Level of Independence: Independent with assistive device(s)               Hand Dominance        Extremity/Trunk Assessment   Upper Extremity Assessment Upper Extremity Assessment: Generalized weakness    Lower Extremity Assessment Lower Extremity Assessment: Generalized weakness    Cervical / Trunk Assessment Cervical / Trunk Assessment: Normal  Communication   Communication: No difficulties  Cognition Arousal/Alertness: Awake/alert Behavior During Therapy: WFL for tasks assessed/performed Overall Cognitive Status: Within Functional Limits for tasks assessed                      General Comments  Exercises     Assessment/Plan    PT Assessment Patient needs continued PT services  PT Problem List Decreased strength;Decreased activity tolerance;Decreased balance;Decreased mobility;Decreased knowledge of precautions;Decreased safety awareness;Decreased knowledge of use of DME       PT Treatment Interventions DME instruction;Gait training;Functional mobility training;Therapeutic activities;Patient/family  education;Therapeutic exercise    PT Goals (Current goals can be found in the Care Plan section)  Acute Rehab PT Goals Patient Stated Goal: to go home PT Goal Formulation: With patient/family Time For Goal Achievement: 07/30/16 Potential to Achieve Goals: Good    Frequency Min 3X/week   Barriers to discharge Decreased caregiver support lives with daughter, home alone during the day    Co-evaluation               End of Session Equipment Utilized During Treatment: Gait belt Activity Tolerance: Patient tolerated treatment well Patient left: in chair;with call bell/phone within reach;with chair alarm set;with family/visitor present Nurse Communication: Mobility status PT Visit Diagnosis: Unsteadiness on feet (R26.81);Muscle weakness (generalized) (M62.81);History of falling (Z91.81)         Time: 2194-7125 PT Time Calculation (min) (ACUTE ONLY): 33 min   Charges:   PT Evaluation $PT Eval Low Complexity: 1 Procedure PT Treatments $Gait Training: 8-22 mins   PT G CodesTresa Endo PT [phone]  Claretha Cooper 07/16/2016, 1:05 PM

## 2016-07-16 NOTE — Consult Note (Signed)
Consultation Note Date: 07/16/2016   Patient Name: [name]  DOB: 1940-07-31  MRN: [id number]  Age / Sex: 76 y.o., female  PCP: No Pcp Per Patient Referring Physician: [name], MD  Reason for Consultation: Establishing goals of care, Non pain symptom management and Pain control  HPI/Patient Profile: 76 y.o. female  with past medical history of lymphoma admitted on 07/13/2016 with severe low back pain, hypercalcemia and fall at home with reported 3 weeks of pain and functional status decline. was admitted by Dr. Alvy Bimler for symptom management and for recurrent malignancy evaluation. CT of abdomen and chest showed widely metastatic cancer including what may be a new primary lung mass.    Clinical Assessment and Goals of Care: Barbarita has hypercalcemia and widely metastatic disease-her prognosis is worrisome, but determining if this is lymphoma or lung cancer would certainly affect prognostication and treatment options-this work up in underway with Dr. Alvy Bimler. MRI pending for today hopefully.  Met with Cristabel and her daughter Cecille Rubin to introduce palliative care services and to determine what their needs were during this hospitalization. At the time of my visit Odessie had just had IV dilaudid and was having significant nausea following the opioid medication administration making goals of care discussion difficult. We agreed we would first work on her symptoms and await complete evaluation and work with Dr. Alvy Bimler for next steps in plan of care.   SUMMARY OF RECOMMENDATIONS   1. DNR, already established 2. Constipation: Last BM Friday she was refusing Miralax- my guess is because of her nausea-zofran can also make constipation worse along with the opioids. Will switch to dulcolax and if needed we can use Relistor. 3. Pain:  Compression fracture at L5: Interval development of a biconcave compression  deformity at L5- could be malignant or from her fall MRI pending. She would benefit from Intranasal Calcitonin- I suspect much of her back pain if coming from this area based on her description of symptoms. Would also consider adding on a small dose of gabapentin for neuropathic pain. 4. Nausea(predominant), Vomiting: Multi-factorial, but patient and daughter feel strongly this is associated with opioid administration- opioid induced NV is usually most pronounced in first 72 hours and then improves gradually. Will schedule zofran and add on Reglan and if she is still nauseated tomorrow - will rotate her opioids. Would also consider adding on dexamethasone if the the nausea persists and in line with treatment plan.      Primary Diagnoses: Present on Admission: . Osteoporosis   I have reviewed the medical record, interviewed the patient and family, and examined the patient. The following aspects are pertinent.  Past Medical History:  Diagnosis Date  . Atherosclerosis of native arteries of the extremities with ulceration(440.23)   . CAD (coronary artery disease)   . DVT (deep venous thrombosis) ([place])   . 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 Hypertension   . Myocardial infarction   . Neuromuscular disorder ([place])  02/2009  . Osteoporosis 08/17/2011   Social History   Social History  . Marital status: Divorced    Spouse name: N/A  . Number of children: N/A  . Years of education: N/A   Social History Main Topics  . Smoking status: Current Every Day Smoker    Packs/day: 1.00    Years: 50.00    Types: Cigarettes    Last attempt to quit: 02/25/2009  . Smokeless tobacco: Never Used     Comment: smoking ~4 cigarettes/day-03/23/16-gwd  . Alcohol use No  . Drug use: No  . Sexual activity: Not on file   Other Topics Concern  . Not on file   Social History Narrative  . No narrative on file   Family History    Problem Relation Age of Onset  . Heart attack Father   . Cancer Brother     lung ca   Scheduled Meds: . acetaminophen  650 mg Oral QID  . amLODipine  10 mg Oral Daily  . aspirin EC  81 mg Oral Daily  . atorvastatin  80 mg Oral Daily  . calcitonin (salmon)  1 spray Alternating Nares Daily  . enoxaparin (LOVENOX) injection  40 mg Subcutaneous Q24H  . feeding supplement (ENSURE ENLIVE)  237 mL Oral BID BM  . ipratropium-albuterol  3 mL Nebulization BID  . magnesium oxide  400 mg Oral BID  . metoCLOPramide (REGLAN) injection  5 mg Intravenous Q12H  . metoprolol  50 mg Oral BID  . multivitamin with minerals  1 tablet Oral Daily  . ondansetron (ZOFRAN) IV  4 mg Intravenous Q8H  . polyethylene glycol  17 g Oral Daily  . senna-docusate  1 tablet Oral BID   Continuous Infusions: . sodium chloride 500 mL (07/16/16 0524)   PRN Meds:.bisacodyl, HYDROmorphone (DILAUDID) injection, HYDROmorphone, nitroGLYCERIN, prochlorperazine Medications Prior to Admission:  Prior to Admission medications   Medication Sig Start Date End Date Taking? Authorizing Provider  acetaminophen (TYLENOL) 500 MG tablet Take 1,000 mg by mouth every 6 (six) hours as needed for mild pain, moderate pain or headache. Takes for pain   Yes Historical Provider, MD  amLODipine (NORVASC) 10 MG tablet Take 1 tablet (10 mg total) by mouth daily. 06/01/16  Yes Burnell Blanks, MD  aspirin EC 81 MG tablet Take 81 mg by mouth daily.   Yes Historical Provider, MD  atorvastatin (LIPITOR) 80 MG tablet Take 80 mg by mouth every evening.   Yes Historical Provider, MD  Calcium Carbonate-Vitamin D (CALCIUM 600+D) 600-400 MG-UNIT tablet Take 1 tablet by mouth 2 (two) times daily.   Yes Historical Provider, MD  clopidogrel (PLAVIX) 75 MG tablet Take 1 tablet (75 mg total) by mouth daily. 06/01/16  Yes Burnell Blanks, MD  magnesium oxide (MAG-OX) 400 (241.3 Mg) MG tablet Take 1 tablet (400 mg total) by mouth 2 (two) times daily.  06/01/16  Yes Burnell Blanks, MD  metoprolol (LOPRESSOR) 50 MG tablet Take 1 tablet (50 mg total) by mouth 2 (two) times daily. 06/01/16  Yes Burnell Blanks, MD  Multiple Vitamins-Minerals (MULTIVITAMIN WITH MINERALS) tablet Take 1 tablet by mouth daily.    Yes Historical Provider, MD  nitroGLYCERIN (NITROSTAT) 0.4 MG SL tablet Place 0.4 mg under the tongue every 5 (five) minutes as needed for chest pain.   Yes Historical Provider, MD   No Known Allergies Review of Systems  Physical Exam  Vital Signs: BP 140/70 (BP Location: Right Arm)   Pulse 77  Temp 99.5 F (37.5 C) (Oral)   Resp 20   Ht 5' 5"  (1.651 m)   Wt 68 kg (150 lb)   SpO2 92%   BMI 24.96 kg/m  Pain Assessment: 0-10   Pain Score: 2    SpO2: SpO2: 92 % O2 Device:SpO2: 92 % O2 Flow Rate: .   IO: Intake/output summary:   Intake/Output Summary (Last 24 hours) at 07/16/16 1201 Last data filed at 07/16/16 1005  Gross per 24 hour  Intake              720 ml  Output              300 ml  Net              420 ml    LBM: Last BM Date: 07/13/16 Baseline Weight: Weight: 68 kg (150 lb) Most recent weight: Weight: 68 kg (150 lb)     Palliative Assessment/Data:   Flowsheet Rows   Flowsheet Row Most Recent Value  Intake Tab  Referral Department  Oncology  Unit at Time of Referral  Oncology Unit  Palliative Care Primary Diagnosis  Cancer  Date Notified  07/15/16  Palliative Care Type  New Palliative care  Reason for referral  Clarify Goals of Care  Date of Admission  07/13/16  Date first seen by Palliative Care  07/16/16  # of days IP prior to Palliative referral  2  Clinical Assessment  Palliative Performance Scale Score  40%  Pain Max last 24 hours  10  Pain Min Last 24 hours  2  Dyspnea Max Last 24 Hours  5  Dyspnea Min Last 24 hours  0  Nausea Max Last 24 Hours  10  Nausea Min Last 24 Hours  8  Anxiety Max Last 24 Hours  0  Anxiety Min Last 24 Hours  0  Psychosocial & Spiritual Assessment    Palliative Care Outcomes  Palliative Care Outcomes  Improved pain interventions, Improved non-pain symptom therapy       Time Total: 50 minutes Greater than 50%  of this time was spent counseling and coordinating care related to the above assessment and plan.  Signed by: Lane Hacker, DO   Please contact Palliative Medicine Team phone at [phone] for questions and concerns.  For individual provider: See Shea Evans

## 2016-07-16 NOTE — Progress Notes (Signed)
  MEDICATION-RELATED CONSULT NOTE   IR Procedure Consult - Anticoagulant/Antiplatelet PTA/Inpatient Med List Review by Pharmacist    Procedure: US guided biopsy of indeterminate nodule within the subcutaneous tissues of the right upper back.    Completed: 4314 on 2/19  Post-Procedural bleeding risk per IR MD assessment:  standard  Antithrombotic medications on inpatient or PTA profile prior to procedure:     Aspirin 81 po Daily  Enoxaparin 40 mg SQ daily, next dose due at 2200 on 2/19    Recommended restart time per IR Post-Procedure Guidelines:   Continue aspirin 81 mg PO daily as scheduled Enoxaparin - Day + 1 (Next AM)   Other considerations:      Plan:      Aspirin 81 po daily  Change next scheduled administration time from 2200 on 2/19 to 1000 on 2/20.  Royetta Asal, PharmD, BCPS Pager [phone] 07/16/2016 8:21 PM

## 2016-07-16 NOTE — Progress Notes (Signed)
[name]   DOB:1941-04-24   KG#:[id number]    Subjective: Her cough has resolved.  Her pain is under control.  She is still not moving much.  She is constipated for 3 days.  Objective:  Vitals:   07/15/16 2200 07/16/16 0711  BP: 135/66 140/70  Pulse: 87 77  Resp: 20 20  Temp: 98.5 F (36.9 C) 99.5 F (37.5 C)     Intake/Output Summary (Last 24 hours) at 07/16/16 7026 Last data filed at 07/15/16 1901  Gross per 24 hour  Intake              480 ml  Output              300 ml  Net              180 ml    GENERAL:alert, no distress and comfortable SKIN: skin color, texture, turgor are normal, no rashes or significant lesions EYES: normal, Conjunctiva are pink and non-injected, sclera clear OROPHARYNX:no exudate, no erythema and lips, buccal mucosa, and tongue normal  NECK: supple, thyroid normal size, non-tender, without nodularity LYMPH:  no palpable lymphadenopathy in the cervical, axillary or inguinal LUNGS: clear to auscultation and percussion with normal breathing effort HEART: regular rate & rhythm and no murmurs and no lower extremity edema ABDOMEN:abdomen soft, non-tender and normal bowel sounds Musculoskeletal:no cyanosis of digits and no clubbing  NEURO: alert & oriented x 3 with fluent speech, no focal motor/sensory deficits   Labs:  Lab Results  Component Value Date   WBC 12.0 (H) 07/15/2016   HGB 10.1 (L) 07/15/2016   HCT 31.9 (L) 07/15/2016   MCV 92.2 07/15/2016   PLT 232 07/15/2016   NEUTROABS 8.0 (H) 07/15/2016    Lab Results  Component Value Date   NA 140 07/15/2016   K 4.1 07/15/2016   CL 107 07/15/2016   CO2 27 07/15/2016    Studies:  Ct Chest W Contrast  Result Date: 07/14/2016 CLINICAL DATA:  Non-Hodgkin's lymphoma. Severe uncontrolled back pain with hypercalcemia and elevated LDH levels. Evaluate for recurrence. EXAM: CT CHEST, ABDOMEN, AND PELVIS WITH CONTRAST TECHNIQUE: Multidetector CT imaging of the chest, abdomen and pelvis was  performed following the standard protocol during bolus administration of intravenous contrast. CONTRAST:  9m ISOVUE-300 IOPAMIDOL (ISOVUE-300) INJECTION 61%, 1029mISOVUE-300 IOPAMIDOL (ISOVUE-300) INJECTION 61% COMPARISON:  Prior CTs 09/12/2012 FINDINGS: CT CHEST FINDINGS Cardiovascular: There is diffuse atherosclerosis of the aorta, great vessels and coronary arteries. No acute vascular findings are seen. The heart size is normal. There is no pericardial effusion. Mediastinum/Nodes: There is new mediastinal and right hilar lymphadenopathy. Right paratracheal node measures 2.9 x 2.4 cm on image 25. There is a subcarinal node measuring 2.6 x 1.6 cm on image 31 and a right hilar node measuring 2.0 x 1.3 cm on image 28. There is a large paramediastinal mass adjacent to the ascending aorta which is probably a pulmonary lesion, described below. The thyroid gland, trachea and esophagus demonstrate no significant findings. Lungs/Pleura: There is no pleural effusion. There is dependent bibasilar atelectasis. As above, there is a large right upper lobe mass abutting the anterior mediastinum. This measures 5.9 x 4.2 cm on image 70. Additional pulmonary nodules are present, including a 1.2 cm right lower lobe nodule on image 106. Some of these are ground-glass, including ill-defined components in the left upper lobe which have progressed compared with the prior study. Mild underlying emphysema noted. Musculoskeletal/Chest wall: Mild thoracic spine compression deformities are stable.  There are no worrisome osseous findings. There is a new soft tissue nodule in the subcutaneous fat of the right upper back, overlying the right scapula. This measures 11 mm on image 16. CT ABDOMEN AND PELVIS FINDINGS Hepatobiliary: There is a new dominant mass in the lateral segment of left hepatic lobe which measures 5.6 x 5.5 cm on image 61. There are additional smaller lesions within the right lobe measuring 12 mm on image 54. There is a  smaller lesion more inferiorly on image 63. There is chronic extrahepatic biliary dilatation status post cholecystectomy which appears similar to the prior study. Pancreas: The pancreas is atrophied. There is no evidence of pancreatic mass, surrounding inflammation or ductal dilatation. Spleen: Normal in size without focal abnormality. Adrenals/Urinary Tract: There is increased nodularity of the left adrenal gland which demonstrates a 1.9 x 1.4 cm nodule on image 62. The right adrenal gland appears normal. There is a new intermediate density nodule in the posterior aspect of the right kidney measuring 1.4 cm on image 22 of series 7. This has an indeterminate soft tissue attenuation. The kidneys otherwise appear stable with small cysts bilaterally. There is some cortical thinning and perinephric soft tissue stranding on the left. No evidence of hydronephrosis or ureteral calculus. The bladder appears unremarkable. Stomach/Bowel: No evidence of bowel wall thickening, distention or surrounding inflammatory change. The stomach is incompletely distended and suboptimally evaluated. Vascular/Lymphatic: There are no enlarged abdominal or pelvic lymph nodes. There is diffuse aortic and branch vessel atherosclerosis. No evidence of large vessel occlusion. Reproductive: The uterus is atrophied.  No evidence of adnexal mass. Other: There is a new left upper quadrant omental mass measuring 2.0 x 2.1 cm on image 71. No ascites or generalized peritoneal nodularity. Musculoskeletal: Interval development of a biconcave compression deformity at L5. No underlying lytic lesion is seen within the vertebral body, although there is a lytic lesion involving the right L4 transverse process, most obvious on the coronal images. Previous left proximal femoral ORIF noted. IMPRESSION: 1. There are multiple new lesions consistent with metastatic disease. There is a dominant right upper lobe mass with additional bilateral pulmonary nodules,  mediastinal and right hilar adenopathy, several hepatic lesions, a new left adrenal nodule, an omental peritoneal implant and at least 1 lytic osseous lesion. Findings could be secondary to recurrent multifocal lymphoma, although pattern is atypical for lymphoma, and primary lung cancer should be considered given the size of the right upper lobe lesion. 2. Additional subcutaneous nodule in the right upper back could reflect a metastasis. 3. Chronic lung disease with associated atelectasis and scattered ground-glass densities. 4. Indeterminate intermediate density lesion in the posterior interpolar region of the right kidney. 5. Age-indeterminate L5 compression fracture. Electronically Signed   By: Richardean Sale M.D.   On: 07/14/2016 11:59   Ct Abdomen Pelvis W Contrast  Result Date: 07/14/2016 CLINICAL DATA:  Non-Hodgkin's lymphoma. Severe uncontrolled back pain with hypercalcemia and elevated LDH levels. Evaluate for recurrence. EXAM: CT CHEST, ABDOMEN, AND PELVIS WITH CONTRAST TECHNIQUE: Multidetector CT imaging of the chest, abdomen and pelvis was performed following the standard protocol during bolus administration of intravenous contrast. CONTRAST:  97m ISOVUE-300 IOPAMIDOL (ISOVUE-300) INJECTION 61%, 1046mISOVUE-300 IOPAMIDOL (ISOVUE-300) INJECTION 61% COMPARISON:  Prior CTs 09/12/2012 FINDINGS: CT CHEST FINDINGS Cardiovascular: There is diffuse atherosclerosis of the aorta, great vessels and coronary arteries. No acute vascular findings are seen. The heart size is normal. There is no pericardial effusion. Mediastinum/Nodes: There is new mediastinal and right hilar lymphadenopathy. Right  paratracheal node measures 2.9 x 2.4 cm on image 25. There is a subcarinal node measuring 2.6 x 1.6 cm on image 31 and a right hilar node measuring 2.0 x 1.3 cm on image 28. There is a large paramediastinal mass adjacent to the ascending aorta which is probably a pulmonary lesion, described below. The thyroid gland,  trachea and esophagus demonstrate no significant findings. Lungs/Pleura: There is no pleural effusion. There is dependent bibasilar atelectasis. As above, there is a large right upper lobe mass abutting the anterior mediastinum. This measures 5.9 x 4.2 cm on image 70. Additional pulmonary nodules are present, including a 1.2 cm right lower lobe nodule on image 106. Some of these are ground-glass, including ill-defined components in the left upper lobe which have progressed compared with the prior study. Mild underlying emphysema noted. Musculoskeletal/Chest wall: Mild thoracic spine compression deformities are stable. There are no worrisome osseous findings. There is a new soft tissue nodule in the subcutaneous fat of the right upper back, overlying the right scapula. This measures 11 mm on image 16. CT ABDOMEN AND PELVIS FINDINGS Hepatobiliary: There is a new dominant mass in the lateral segment of left hepatic lobe which measures 5.6 x 5.5 cm on image 61. There are additional smaller lesions within the right lobe measuring 12 mm on image 54. There is a smaller lesion more inferiorly on image 63. There is chronic extrahepatic biliary dilatation status post cholecystectomy which appears similar to the prior study. Pancreas: The pancreas is atrophied. There is no evidence of pancreatic mass, surrounding inflammation or ductal dilatation. Spleen: Normal in size without focal abnormality. Adrenals/Urinary Tract: There is increased nodularity of the left adrenal gland which demonstrates a 1.9 x 1.4 cm nodule on image 62. The right adrenal gland appears normal. There is a new intermediate density nodule in the posterior aspect of the right kidney measuring 1.4 cm on image 22 of series 7. This has an indeterminate soft tissue attenuation. The kidneys otherwise appear stable with small cysts bilaterally. There is some cortical thinning and perinephric soft tissue stranding on the left. No evidence of hydronephrosis or  ureteral calculus. The bladder appears unremarkable. Stomach/Bowel: No evidence of bowel wall thickening, distention or surrounding inflammatory change. The stomach is incompletely distended and suboptimally evaluated. Vascular/Lymphatic: There are no enlarged abdominal or pelvic lymph nodes. There is diffuse aortic and branch vessel atherosclerosis. No evidence of large vessel occlusion. Reproductive: The uterus is atrophied.  No evidence of adnexal mass. Other: There is a new left upper quadrant omental mass measuring 2.0 x 2.1 cm on image 71. No ascites or generalized peritoneal nodularity. Musculoskeletal: Interval development of a biconcave compression deformity at L5. No underlying lytic lesion is seen within the vertebral body, although there is a lytic lesion involving the right L4 transverse process, most obvious on the coronal images. Previous left proximal femoral ORIF noted. IMPRESSION: 1. There are multiple new lesions consistent with metastatic disease. There is a dominant right upper lobe mass with additional bilateral pulmonary nodules, mediastinal and right hilar adenopathy, several hepatic lesions, a new left adrenal nodule, an omental peritoneal implant and at least 1 lytic osseous lesion. Findings could be secondary to recurrent multifocal lymphoma, although pattern is atypical for lymphoma, and primary lung cancer should be considered given the size of the right upper lobe lesion. 2. Additional subcutaneous nodule in the right upper back could reflect a metastasis. 3. Chronic lung disease with associated atelectasis and scattered ground-glass densities. 4. Indeterminate intermediate density lesion  in the posterior interpolar region of the right kidney. 5. Age-indeterminate L5 compression fracture. Electronically Signed   By: Richardean Sale M.D.   On: 07/14/2016 11:59    Assessment & Plan:   History of lymphoma Diffuse metastatic cancer in the lung, bone, adrenal, liver, and skin, likely  newly diagnosed metastatic lung cancer Despite her history of lymphoma, the pattern of disease process seen on CT imaging is likely consistent with metastatic lung cancer.  The patient had a prior history of smoking. The least invasive site for biopsy would be the subcutaneous nodule. She had taken Plavix, last on 07/13/2016. I would discussed this with interventional radiologist today.  Given the low risk of bleeding with subcutaneous nodule, it is possible that biopsy could be done even with recent ingestion of Plavix. I would complete staging with MRI of her lumbar spine along with MRI of the brain. I will consult radiation oncologist tomorrow for possible radiation treatment to the painful bone lesion that is the cause of her admission.  Severe, uncontrolled pain This could be related to recent fall or cancer associated pain I will continueon low-dose Dilaudid p.o. and IV Dilaudid as needed I will order MRI of the lumbar spine to assess for bone lesion further. She may benefit from palliative radiation treatment  Hypercalcemia of malignancy We will continue IV fluid hydration  Leukocytosis Cough, resolved I do not believe she have pneumonia. Likely, she have COPD exacerbation and related to compression of her airway from cancer She feels well on some nebulizer treatment and cough suppressant as needed  CODE STATUS We discussed goals of care. She agreed to change her CODE STATUS to DNR  History of coronary artery disease status post myocardial infarction I will continue all her home medications except for Plavix, in case surgical intervention is needed  Remote history of DVT We will start her on Lovenox for DVT prophylaxis, especially due to her poor mobility  Severe weakness She has been debilitated for over 3 weeks I will consult physical therapy   Discharge planning Goals of CARE discussion We will consult palliative care to assist in goals of care discussion and  symptom management The patient understood that if this is metastatic lung cancer, treatment would be strictly palliative in nature We discussed CODE STATUS She agreed to change her CODE STATUS to DNR   [name], MD 07/16/2016  7:28 AM

## 2016-07-16 NOTE — Procedures (Signed)
Pre Procedure Dx: NHL Post Procedural Dx: Same  Technically successful US guided biopsy of indeterminate nodule within the subcutaneous tissues of the right upper back.  EBL: None  No immediate complications.   Ronny Bacon, MD Pager #: [phone]

## 2016-07-17 ENCOUNTER — Inpatient Hospital Stay (HOSPITAL_COMMUNITY): Payer: Medicare Other

## 2016-07-17 DIAGNOSIS — K5900 Constipation, unspecified: Secondary | ICD-10-CM

## 2016-07-17 MED ORDER — METHYLNALTREXONE BROMIDE 12 MG/0.6ML ~~LOC~~ SOLN
12.0000 mg | Freq: Once | SUBCUTANEOUS | Status: AC
  Administered 2016-07-17: 12 mg via SUBCUTANEOUS
  Filled 2016-07-17 (×2): qty 0.6

## 2016-07-17 MED ORDER — DEXAMETHASONE SODIUM PHOSPHATE 4 MG/ML IJ SOLN
4.0000 mg | Freq: Four times a day (QID) | INTRAMUSCULAR | Status: DC
  Administered 2016-07-17 – 2016-07-18 (×4): 4 mg via INTRAVENOUS
  Filled 2016-07-17 (×4): qty 1

## 2016-07-17 MED ORDER — POLYETHYLENE GLYCOL 3350 17 G PO PACK: 17.0000 g | PACK | Freq: Every day | ORAL | Status: DC

## 2016-07-17 MED ORDER — GADOBENATE DIMEGLUMINE 529 MG/ML IV SOLN
15.0000 mL | Freq: Once | INTRAVENOUS | Status: AC | PRN
  Administered 2016-07-17: 14 mL via INTRAVENOUS

## 2016-07-17 MED ORDER — ZOLPIDEM TARTRATE 5 MG PO TABS
5.0000 mg | ORAL_TABLET | Freq: Every evening | ORAL | Status: DC | PRN
  Administered 2016-07-17: 5 mg via ORAL

## 2016-07-17 NOTE — Progress Notes (Signed)
Daily Progress Note   Patient Name: [name]       Date: 07/17/2016 DOB: April 17, 1941  Age: 76 y.o. MRN#: [id number] Attending Physician: [name], MD Primary Care Physician: No PCP Per Patient Admit Date: 07/13/2016  Reason for Consultation/Follow-up: Establishing goals of care, Non pain symptom management, Pain control and Psychosocial/spiritual support  Subjective: Cerenity looks and feels better today. Nausea is improved. Still no BM. Reports being very weak, needs assistance with walking and transfers to the bed. Her pain is under better control but I suspect that she is fairly stoic with her reports based on my exam-she may be limiting her activity due to pain. She is comfortable not moving but activity increases her symptoms. She is less drowsy today.   Length of Stay: 4  Current Medications: Scheduled Meds:  . acetaminophen  650 mg Oral QID  . amLODipine  10 mg Oral Daily  . aspirin EC  81 mg Oral Daily  . atorvastatin  80 mg Oral Daily  . calcitonin (salmon)  1 spray Alternating Nares Daily  . enoxaparin (LOVENOX) injection  40 mg Subcutaneous Q24H  . feeding supplement (ENSURE ENLIVE)  237 mL Oral BID BM  . ipratropium-albuterol  3 mL Nebulization BID  . magnesium oxide  400 mg Oral BID  . metoCLOPramide (REGLAN) injection  5 mg Intravenous Q12H  . metoprolol  50 mg Oral BID  . multivitamin with minerals  1 tablet Oral Daily  . ondansetron (ZOFRAN) IV  4 mg Intravenous Q8H  . polyethylene glycol  17 g Oral Daily  . senna-docusate  1 tablet Oral BID  . zolpidem  5 mg Oral Once    Continuous Infusions:   PRN Meds: bisacodyl, HYDROmorphone (DILAUDID) injection, HYDROmorphone, nitroGLYCERIN, prochlorperazine, zolpidem  Physical Exam       + Straight leg raise  test, no foot drop, dorsiflexion and plantar flexion were equal and strong, was unable to test her gait, denies loss of bowel or urinary retention. Her breathing is not labored, diminished air movement on right.  Vital Signs: BP (!) 109/49 (BP Location: Right Arm)   Pulse 73   Temp 97.9 F (36.6 C) (Oral)   Resp 17   Ht '5\' 5"'$  (1.651 m)   Wt 68 kg (150 lb)   SpO2 92%   BMI 24.96  kg/m  SpO2: SpO2: 92 % O2 Device: O2 Device: Not Delivered O2 Flow Rate:    Intake/output summary:  Intake/Output Summary (Last 24 hours) at 07/17/16 1904 Last data filed at 07/17/16 1424  Gross per 24 hour  Intake             1310 ml  Output                0 ml  Net             1310 ml   LBM: Last BM Date: 07/13/16 Baseline Weight: Weight: 68 kg (150 lb) Most recent weight: Weight: 68 kg (150 lb)       Palliative Assessment/Data:    Flowsheet Rows   Flowsheet Row Most Recent Value  Intake Tab  Referral Department  Oncology  Unit at Time of Referral  Oncology Unit  Palliative Care Primary Diagnosis  Cancer  Date Notified  07/15/16  Palliative Care Type  New Palliative care  Reason for referral  Clarify Goals of Care  Date of Admission  07/13/16  Date first seen by Palliative Care  07/16/16  # of days IP prior to Palliative referral  2  Clinical Assessment  Palliative Performance Scale Score  40%  Pain Max last 24 hours  6  Pain Min Last 24 hours  4  Dyspnea Max Last 24 Hours  6  Dyspnea Min Last 24 hours  4  Nausea Max Last 24 Hours  3  Nausea Min Last 24 Hours  0  Anxiety Max Last 24 Hours  0  Anxiety Min Last 24 Hours  0  Psychosocial & Spiritual Assessment  Palliative Care Outcomes  Patient/Family meeting held?  No  Palliative Care Outcomes  Improved pain interventions, Improved non-pain symptom therapy  Palliative Care follow-up planned  Yes, Home  Palliative Care Follow-up Reason  Pain, Non-pain symptom, Clarify goals of care      Patient Active Problem List   Diagnosis  Date Noted  . DNR (do not resuscitate) 07/16/2016  . Compression fracture of L5 lumbar vertebra ([place]) 07/16/2016  . Hypercalcemia 07/13/2016  . Acute back pain 07/13/2016  . Need for prophylactic vaccination and inoculation against influenza 04/17/2013  . Chronic total occlusion of artery of the extremities ([place]) 09/21/2011  . Osteoporosis 08/17/2011  . History of B-cell lymphoma   . PAD (peripheral artery disease) ([place]) 11/10/2010  . DVT 02/23/2010  . PVD 06/01/2009  . Essential hypertension, benign 04/20/2009  . CAD, NATIVE VESSEL 04/20/2009  . DYSLIPIDEMIA 04/12/2009  . AMI, INFERIOR WALL 04/12/2009  . DIASTOLIC DYSFUNCTION 56/38/7564  . ATHEROSCLEROSIS, NATIVE ARTERY, EXTREMITY, WITH CLAUDICATION 04/05/2009  . CAROTID BRUIT 04/05/2009    Palliative Care Assessment & Plan   Patient Profile: 76 yo with known history of lymphoma in remission since 2013. Presented with acute onset back pain, fall and was found on CT to have widely metastatic cancer felt to be secondary due to lung cancer. MRI and pathology pending. CT showed probable malignant compression fracture at L5 which is most likely causing the majority of her neuropathic pain.   Assessment: Acute Intractable Pain: Multifactorial but presumably related to her cancer/metastatic disease tumor burden and compression fracture nerve root compression by tumor.  Doing well with oral dilaudid, at least at rest, will ask PT to describe mobility limitations related to pain or evidence of foot drop.  LOW threshold for starting Decadron if she develops signs of cauda equina or cord compression.   Opioid induced  nausea is improved. Continue reglan for now.  Constipation: Agree with Relistor and oral osmotic laxatives.  Recommendations/Plan:  Will await MRI and biopsy results but this will likely have a very poor prognosis and I suspect we will need to consider hospice options. I'm not sure patient or her daughter is expecting this  kind of news but once we get our information and have a better prognostic picture together, I am happy to support discussion and help with any palliative needs.  Will continue to follow closely and help with symptoms as needed.  Mobility and preservation of functional status independence will be critical to her QOL.  Goals of Care and Additional Recommendations:  Limitations on Scope of Treatment: Full Scope Treatment  Code Status:    Code Status Orders        Start     Ordered   07/15/16 0805  Do not attempt resuscitation (DNR)  Continuous    Question Answer Comment  In the event of cardiac or respiratory ARREST Do not call a "code blue"   In the event of cardiac or respiratory ARREST Do not perform Intubation, CPR, defibrillation or ACLS   In the event of cardiac or respiratory ARREST Use medication by any route, position, wound care, and other measures to relive pain and suffering. May use oxygen, suction and manual treatment of airway obstruction as needed for comfort.      07/15/16 0804    Code Status History    Date Active Date Inactive Code Status Order ID Comments User Context   07/13/2016  3:44 PM 07/15/2016  8:04 AM Full Code [id number]  [name], MD Inpatient       Prognosis:   Unable to determine  Discharge Planning:  To Be Determined  Care plan was discussed with patient, RN, Dr. Alvy Bimler.  Thank you for allowing the Palliative Medicine Team to assist in the care of this patient.   Time: 35 min    Greater than 50%  of this time was spent counseling and coordinating care related to the above assessment and plan.  Haiden Rawlinson, DO  Please contact Palliative Medicine Team phone at [phone] for questions and concerns.

## 2016-07-17 NOTE — Care Management Important Message (Signed)
Important Message  Patient Details  Name: MARIYANA FULOP MRN: [id number] Date of Birth: 01-30-1941   Medicare Important Message Given:  Yes    Kerin Salen 07/17/2016, 10:13 AMImportant Message  Patient Details  Name: SARAIA PLATNER MRN: [id number] Date of Birth: November 25, 1940   Medicare Important Message Given:  Yes    Kerin Salen 07/17/2016, 10:13 AM

## 2016-07-17 NOTE — Progress Notes (Signed)
[name]   DOB:06/04/1940   OJ#:[id number]    Subjective: She feels better.  She complained of poor tolerance to pain medicine.  She had significant constipation and has been refusing MiraLAX because she is afraid she might have an accident even though she has been constipated for over 4 days.  She denies nausea.  Appetite is slightly improved.  Biopsy was performed yesterday.  MRI is pending She was able to walk with physical therapist yesterday but only for one time  Objective:  Vitals:   07/16/16 2051 07/17/16 0423  BP:  118/61  Pulse: 70 73  Resp: 16 16  Temp:  98.6 F (37 C)     Intake/Output Summary (Last 24 hours) at 07/17/16 9937 Last data filed at 07/16/16 1318  Gross per 24 hour  Intake              480 ml  Output                0 ml  Net              480 ml    GENERAL:alert, no distress and comfortable SKIN: skin color, texture, turgor are normal, no rashes or significant lesions EYES: normal, Conjunctiva are pink and non-injected, sclera clear Musculoskeletal:no cyanosis of digits and no clubbing  NEURO: alert & oriented x 3 with fluent speech, no focal motor/sensory deficits   Labs:  Lab Results  Component Value Date   WBC 12.0 (H) 07/15/2016   HGB 10.1 (L) 07/15/2016   HCT 31.9 (L) 07/15/2016   MCV 92.2 07/15/2016   PLT 232 07/15/2016   NEUTROABS 8.0 (H) 07/15/2016    Lab Results  Component Value Date   NA 140 07/15/2016   K 4.1 07/15/2016   CL 107 07/15/2016   CO2 27 07/15/2016    Studies:  US Biopsy  Result Date: 07/16/2016 INDICATION: History of lymphoma, now with findings worrisome for a marked disease progression including development of a indeterminate nodule within the subcutaneous tissues about the right upper back. Please perform ultrasound-guided biopsy for tissue diagnostic purposes. EXAM: ULTRASOUND GUIDED LIVER LESION BIOPSY COMPARISON:  None. MEDICATIONS: None ANESTHESIA/SEDATION: None COMPLICATIONS: None immediate. PROCEDURE:  Informed written consent was obtained from the patient after a discussion of the risks, benefits and alternatives to treatment. The patient understands and consents the procedure. A timeout was performed prior to the initiation of the procedure. Ultrasound scanning was performed of the right upper abdominal quadrant demonstrates an approximately 1.0 x 1.0 cm hypoechoic nodule within the subcutaneous tissues of the right lateral upper back compatible the nodule seen on preceding chest CT image 16, series 2. The procedure was planned. The skin overlying the right lateral upper back was prepped and draped in the usual sterile fashion. The overlying soft tissues were anesthetized with 1% lidocaine with epinephrine. A 17 gauge, 6.8 cm co-axial needle was advanced into a peripheral aspect of the lesion. No fluid was able able to be aspirated from this hypoechoic nodule. As such, This was followed by 5 core biopsies with an 18 gauge core device under direct ultrasound guidance. The coaxial needle tract removed and superficial hemostasis was obtained with manual compression. Post procedural scanning was negative for definitive area of hemorrhage or additional complication. A dressing was placed. The patient tolerated the procedure well without immediate post procedural complication. IMPRESSION: Technically successful ultrasound guided core needle biopsy of indeterminate approximately 1 cm nodule within the subcutaneous tissues of the right upper back.  Electronically Signed   By: Sandi Mariscal M.D.   On: 07/16/2016 16:31    Assessment & Plan:   History of lymphoma Diffuse metastatic cancer in the lung, bone, adrenal, liver, and skin, likely newly diagnosed metastatic lung cancer Despite her history of lymphoma, the pattern of disease process seen on CT imaging is likely consistent with metastatic lung cancer. The patient had a prior history of smoking. The least invasive site for biopsy would be the subcutaneous  nodule. She had taken Plavix, last on 07/13/2016. She had successful skin biopsy on 07/16/2016  Severe, uncontrolled pain This could be related to recent fall or cancer associated pain I will continueon low-dose Dilaudid p.o. and IV Dilaudid as needed I will order MRI of the lumbar spine to assess for bone lesion further. She may benefit from palliative radiation treatment Appreciate palliative care assistance on pain management  Hypercalcemia ofmalignancy We will continue IV fluid hydration  Leukocytosis Cough, resolved I do not believe she have pneumonia. Likely, she have COPD exacerbation and related to compression of her airway from cancer She feels well on some nebulizer treatment and cough suppressant as needed  Severe constipation I encouraged the patient to take laxatives as prescribed. We will start her on 1 dose of Relistor  CODE STATUS We discussed goals of care. She agreed to change her CODE STATUS to DNR  History of coronary artery disease status post myocardial infarction I will continue all her home medications except for Plavix, in case surgical intervention is needed  Remote history of DVT We will start her on Lovenox for DVT prophylaxis, especially due to her poor mobility  Severe weakness She has been debilitated for over 3 weeks I will consult physical therapy; I continue to encourage her to mobilize as tolerated  Discharge planning Goals of CARE discussion We will consult palliative care to assist in goals of care discussion and symptom management The patient understood that if this is metastatic lung cancer, treatment would be strictly palliative in nature We discussed CODE STATUS She agreed to change her CODE STATUS to DNR   [name], MD 07/17/2016  8:22 AM

## 2016-07-18 ENCOUNTER — Inpatient Hospital Stay (HOSPITAL_COMMUNITY): Payer: Medicare Other

## 2016-07-18 ENCOUNTER — Encounter (HOSPITAL_COMMUNITY): Payer: Self-pay | Admitting: Radiology

## 2016-07-18 DIAGNOSIS — G893 Neoplasm related pain (acute) (chronic): Secondary | ICD-10-CM

## 2016-07-18 DIAGNOSIS — C801 Malignant (primary) neoplasm, unspecified: Secondary | ICD-10-CM

## 2016-07-18 DIAGNOSIS — C792 Secondary malignant neoplasm of skin: Secondary | ICD-10-CM

## 2016-07-18 DIAGNOSIS — C7951 Secondary malignant neoplasm of bone: Secondary | ICD-10-CM

## 2016-07-18 DIAGNOSIS — C7970 Secondary malignant neoplasm of unspecified adrenal gland: Secondary | ICD-10-CM

## 2016-07-18 MED ORDER — DEXAMETHASONE SODIUM PHOSPHATE 4 MG/ML IJ SOLN
4.0000 mg | Freq: Three times a day (TID) | INTRAMUSCULAR | Status: DC
  Administered 2016-07-18: 4 mg via INTRAVENOUS
  Filled 2016-07-18: qty 1

## 2016-07-18 MED ORDER — CLOPIDOGREL BISULFATE 75 MG PO TABS
75.0000 mg | ORAL_TABLET | Freq: Every day | ORAL | Status: DC
  Administered 2016-07-19 – 2016-07-24 (×6): 75 mg via ORAL
  Filled 2016-07-18 (×6): qty 1

## 2016-07-18 MED ORDER — PANTOPRAZOLE SODIUM 40 MG PO TBEC
40.0000 mg | DELAYED_RELEASE_TABLET | Freq: Every day | ORAL | Status: DC
  Administered 2016-07-19 – 2016-07-24 (×6): 40 mg via ORAL
  Filled 2016-07-18 (×6): qty 1

## 2016-07-18 MED ORDER — GADOBENATE DIMEGLUMINE 529 MG/ML IV SOLN
15.0000 mL | Freq: Once | INTRAVENOUS | Status: AC | PRN
  Administered 2016-07-18: 14 mL via INTRAVENOUS

## 2016-07-18 MED ORDER — BISACODYL 10 MG RE SUPP
10.0000 mg | Freq: Once | RECTAL | Status: AC
  Administered 2016-07-18: 10 mg via RECTAL
  Filled 2016-07-18: qty 1

## 2016-07-18 NOTE — Progress Notes (Signed)
[name]   DOB:22-Jul-1940   W[place]#:[id number]    Subjective: She denies pain. Her daughter is very upset that the patient became confused after 1 dose of dilaudid and requests that I stopped the medication. She has small bowel movement after dulcolax and tap water enema. She has mild headache  Objective:  Vitals:   07/18/16 0907 07/18/16 1447  BP: (!) 131/59 125/66  Pulse: 75 91  Resp:  16  Temp:  97.8 F (36.6 C)     Intake/Output Summary (Last 24 hours) at 07/18/16 1956 Last data filed at 07/18/16 1345  Gross per 24 hour  Intake              570 ml  Output                0 ml  Net              570 ml    GENERAL:alert, no distress and comfortable SKIN: skin color, texture, turgor are normal, no rashes or significant lesions EYES: normal, Conjunctiva are pink and non-injected, sclera clear Musculoskeletal:no cyanosis of digits and no clubbing  NEURO: alert & oriented x 3 with fluent speech, no focal motor/sensory deficits   Labs:  Lab Results  Component Value Date   WBC 12.0 (H) 07/15/2016   HGB 10.1 (L) 07/15/2016   HCT 31.9 (L) 07/15/2016   MCV 92.2 07/15/2016   PLT 232 07/15/2016   NEUTROABS 8.0 (H) 07/15/2016    Lab Results  Component Value Date   NA 140 07/15/2016   K 4.1 07/15/2016   CL 107 07/15/2016   CO2 27 07/15/2016    Studies: I reviewed the images [name] Wo Contrast  Result Date: 07/18/2016 CLINICAL DATA:  76 y/o F; history of lymphoma and metastatic lung cancer. EXAM: MRI HEAD WITHOUT AND WITH CONTRAST TECHNIQUE: Multiplanar, multiecho pulse sequences of the brain and surrounding structures were obtained without and with intravenous contrast. CONTRAST:  18m MULTIHANCE GADOBENATE DIMEGLUMINE 529 MG/ML IV SOLN COMPARISON:  07/17/2016 MRI of the brain. FINDINGS: Brain: No diffusion signal abnormality. No abnormal susceptibility hypointensity to indicate intracranial hemorrhage. Confluent T2 FLAIR hyperintense signal abnormality throughout  subcortical and periventricular white matter is compatible with advanced chronic microvascular ischemic changes. Moderate brain parenchymal volume loss. Greater than 20 enhancing metastasis are identified scattered throughout the supratentorial brain and there additional metastasis in the left cerebellar hemisphere on the right anterior pons. The largest metastasis are present within the right anterolateral frontal lobe measuring 7 mm (series 10, image 91) and within the left lateral temporal lobe measuring 9 mm (series 10, image 83). There is minimal associated vasogenic edema with the metastasis. 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 Skull and upper cervical spine: Enhancing lesion in the right parietal bone is likely an osseous metastasis. Sinuses/Orbits: Negative. Other: None. IMPRESSION: 1. Greater than 20 supratentorial, 1 left cerebellar hemisphere, and 1 pons metastasis much better appreciated when compared with prior MRI of the brain. The largest lesion measures 9 mm in the left lateral temporal lobe. 2. No evidence of acute infarct, intracranial hemorrhage, or significant mass effect. 3. Stable advanced chronic microvascular ischemic changes and moderate brain parenchymal volume loss. 4. Stable enhancing lesion in right parietal bone likely metastasis. Electronically Signed   By: LKristine GarbeM.D.   On: 07/18/2016 19:32   Mr BJeri CosWYDContrast  Result Date: 07/17/2016 CLINICAL DATA:  History of lymphoma and metastatic lung cancer. Intermittent confusion, assess for intracranial metastasis. History of hypertension. EXAM: MRI HEAD WITHOUT AND WITH CONTRAST TECHNIQUE: Multiplanar, multiecho pulse sequences of the brain and surrounding structures were obtained without and with intravenous contrast. CONTRAST:  14 cc MultiHance COMPARISON:  None. FINDINGS: Post gadolinium sequences are  moderately motion degraded. INTRACRANIAL CONTENTS: No reduced diffusion to suggest acute ischemia or intracranial hypercellular tumor. No susceptibility artifact to suggest hemorrhage. The ventricles and sulci are normal for patient's age. Confluent supratentorial and to lesser extent pontine white matter FLAIR T2 hyperintensities. Old bilateral putaminal lacunar infarcts. Ring-enhancing RIGHT frontal juxta cortical 8 mm lesion. Solidly enhancing subcentimeter RIGHT frontal/insular enhancing nodule. 3 mm RIGHT frontal lobe nodule. 4 mm LEFT frontal lobe nodule. 7 mm ring-enhancing LEFT posterior temporal lobe lesion, 3 mm homogeneously enhancing LEFT parietal metastasis. 3 mm LEFT cerebellar nodule. No abnormal extra-axial fluid collections. No extra-axial masses. VASCULAR: T2 bright signal LEFT vertebral artery most compatible with occlusion. 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 Dolicoectatic intracranial vessels compatible chronic hypertension. SKULL AND UPPER CERVICAL SPINE: No abnormal sellar expansion. Patchy reduced diffusion RIGHT parietal calvarium consistent with metastatic disease, associated expansile low T1 signal. No suspicious calvarial bone marrow signal. Craniocervical junction maintained. SINUSES/ORBITS: The mastoid air-cells and included paranasal sinuses are well-aerated.The included ocular globes and orbital contents are non-suspicious. OTHER: None. IMPRESSION: Limited assessment due to moderately motion degraded post gadolinium sequences. At least 6 supra and single infratentorial subcentimeter metastasis. RIGHT parietal calvarial metastasis. 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 Recommend CTA HEAD and neck for further characterization. Moderate to severe white matter changes compatible chronic small vessel ischemic disease and a component of vasogenic edema. 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 Electronically Signed   By: Elon Alas M.D.   On: 07/17/2016  17:21   Mr Lumbar Spine W Wo Contrast  Result Date: 07/17/2016 CLINICAL DATA:  Low back pain. History of lymphoma and metastatic lung cancer. EXAM: MRI LUMBAR SPINE WITHOUT AND WITH CONTRAST TECHNIQUE: Multiplanar and multiecho pulse sequences of the lumbar spine were obtained without and with intravenous contrast. CONTRAST:  53m MULTIHANCE GADOBENATE DIMEGLUMINE 529 MG/ML IV SOLN COMPARISON:  CT abdomen and pelvis July 04, 2016 FINDINGS: SEGMENTATION: For the purposes of this report, the last well-formed intervertebral disc will be described as L5-S1. ALIGNMENT: Maintenance of the lumbar lordosis. No malalignment. VERTEBRAE: Low T1, bright STIR and enhancing lesions all lumbar levels. Moderate L5 pathologic fracture, approximate 50% height loss without retropulsed bony fragments. Expansile metastasis RIGHT L4 transverse process. Low signal metastasis T11 partially imaged. Multiple sacral metastasis. Intervertebral discs demonstrate normal morphology, mildly desiccation. Edema within the L5-S1 disc. No suspicious intradiscal enhancement. CONUS MEDULLARIS: Conus medullaris terminates at L1-2 and demonstrates normal morphology and signal characteristics. Cauda equina is normal. No suspicious cord or leptomeningeal enhancement. PARASPINAL AND SOFT TISSUES: Abnormal low signal partially imaged LEFT presacral soft tissues concerning for tumor extension, which could affect the sciatic nerve. Moderate symmetric paraspinal muscle atrophy. Mildly ectatic infrarenal aorta. DISC LEVELS: L1-2: No disc bulge. Mild facet arthropathy and ligamentum flavum redundancy without canal stenosis or neural foraminal narrowing. L2-3: Small broad-based disc bulge. Mild facet arthropathy and ligamentum flavum redundancy. Very mild canal stenosis without neural foraminal narrowing. L3-4: Small broad-based disc bulge. Mild facet arthropathy and ligamentum flavum redundancy. Very mild canal stenosis. No neural foraminal narrowing. L4-5:  5 mm broad-based disc bulge. Moderate facet arthropathy and ligamentum flavum redundancy including 3  mm RIGHT facet synovial cyst within dorsal epidural space. Severe canal stenosis in trefoil configuration. Mild bilateral caudal neural foraminal narrowing. Subcentimeter LEFT facet synovial cyst extending into paraspinal soft tissues. L5-S1: Small broad-based disc bulge. Moderate facet arthropathy and ligamentum flavum redundancy without canal stenosis. Minimal neural foraminal narrowing. IMPRESSION: Diffuse osseous metastasis.  Moderate L5 pathologic fracture. Degenerative lumbar spine resulting in severe canal stenosis L4-5, very mild canal stenosis L2-3 and L3-4. Minimal to mild neural foraminal narrowing L4-5 and L5-S1. Low signal LEFT presacral soft tissues most consistent with tumoral extension which could affect the LEFT sciatic nerve. Electronically Signed   By: Elon Alas M.D.   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 I have requested pathologist to add Foundation One testing I have consulted radiation oncologist for palliative radiation therapy to bone and brain  Cancer pain Back pain has resolved with addition of dexamethasone and tylenol This could be related to recent fall or cancer associated pain I have stopped Dilaudid per daughter request She may benefit from palliative radiation treatment Appreciate palliative care assistance on pain management  Hypercalcemia ofmalignancy We will continue IV fluid hydration She is on calcitonin Plan to recheck tomorrow  Leukocytosis Cough, resolved I do not believe she have pneumonia. Likely, she have COPD exacerbation and related to compression of her airway from cancer She feels well on some nebulizer treatment and cough suppressant as needed  Severe constipation I encouraged the  patient to take laxatives as prescribed. 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 Resume Plavix  Remote history of DVT We will start her on Lovenox for DVT prophylaxis, especially due to her poor mobility  Severe weakness She has been debilitated for over 3 weeks I will consult physical therapy; I continue to encourage her to mobilize as tolerated  Discharge planning Goals of CARE discussion We will consult palliative care to assist in goals of care discussion and symptom management The patient understood that if this is metastatic lung cancer, treatment would be strictly palliative in nature Patient and daughter agreed to proceed with palliative radiation treatment We will wait for Foundation One testing; if not targeted therapy is available, she will unlikely proceed with standard chemotherapy. Prognosis is discussed; without systemic chemo, survival is likely < 3 months We discussed referral to social worker for SNF placement while undergoing XRT Ready for discharge once placement is available  [name], MD 07/18/2016  7:56 PM

## 2016-07-18 NOTE — Plan of Care (Signed)
Problem: Safety: Goal: Ability to remain free from injury will improve Outcome: Progressing Patient will continue remain safe while in the hospital. Call bell within reach. Bed is in lowest position.

## 2016-07-18 NOTE — Progress Notes (Signed)
Per Aniceto Boss at [facility], patient will not be able to undergo 3T MRI since she has hx of 3 stents. Aniceto Boss said they can perform 1.5 SRS at [facility]. I called down to MRI and radiology will ask permission from Carola Rhine PA-C.

## 2016-07-18 NOTE — Progress Notes (Signed)
Tap water enema administered per rectum,tolerated 400cc. Also inserted bisacodyl supp. Awaiting for results.

## 2016-07-18 NOTE — Progress Notes (Signed)
Patient was taken to MRI.

## 2016-07-18 NOTE — Consult Note (Addendum)
Radiation Oncology         [phone] ________________________________  Name: [name] MRN: [id number]  Date: 07/13/2016  DOB: July 02, 1940  CC:No PCP Per Patient  No ref. provider found     REFERRING PHYSICIAN: No ref. provider foundDr. Alvy Bimler   DIAGNOSIS: The primary encounter diagnosis was History of B-cell lymphoma. Diagnoses of Hypercalcemia, Acute midline low back pain with bilateral sciatica, Lymphoma ([place]), Lung cancer ([place]), Pain from bone metastases ([place]), COPD mixed type ([place]), Other constipation, Skin nodule, Metastasis to brain [facility]), and Brain tumor [facility]) were also pertinent to this visit.   HISTORY OF PRESENT ILLNESS: [name] is a 76 y.o. female seen at the request of Dr. Alvy Bimler for evaluation of metastatic lung cancer with significant cancer related pain in the lower back.  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 She was recently admitted to the hospital on 07/13/2016 for severe uncontrolled low back pain. She reports having pain in her lower back ongoing for the past 3-4 weeks which she had been treating with over-the-counter Tylenol, topical pain creams and pain patches. The pain progressively worsened to the point that she was unable to stand or find a position of comfort which is the what brought her to the Emergency Department.  She was noted to have elevated ALP, LDH and calcium on her admission lab work which was concerning for disease recurrence.  CT chest/abdomen/pelvis was performed on 07/14/2016 for further evaluation and demonstrated a large right upper lobe mass abutting the anterior mediastinum. The mass measures 5.9 x 4.2 cm. There are additional bilateral pulmonary nodules, including a 1.2cm right lower lobe nodule as well as mediastinal and right hilar adenopathy, several hepatic lesions, a new left adrenal nodule, and omental  peritoneal implant and at least one lytic osseous lesion. There is a new 41m soft tissue nodule in the subcutaneous fat of the right upper back, overlying the right scapula. The findings were felt highly suspicious for primary lung cancer with metastasis.    Ultrasound-guided core needle biopsy of the subcutaneous nodule of the right upper back was performed on 07/16/2016. Pathology confirmed adenocarcinoma with the immunophenotype consistent with metastatic lung adenocarcinoma.   MRI of the lumbar spine performed on 07/17/2016 demonstrated enhancing lesions at all lumbar levels. There was a moderate L5 pathologic fracture , approximately 50% height loss without retropulsed bony fragments. Expansile metastasis right L4 transverse process and multiple sacral metastasis. There was no suspicious intradiscal, cord or leptomeningeal enhancement. There was abnormal signal in the left presacral soft tissues concerning for tumor extension, which could affect the sciatic nerve.   MRI brain performed on 07/17/2016 reveals at least 6 super and single infratentorial subcentimeter metastasis.Ring-enhancing RIGHT frontal juxta cortical 8 mm lesion. Solidly enhancing subcentimeter RIGHT frontal/insular enhancing nodule. 3 mm RIGHT frontal lobe nodule. 4 mm LEFT frontal lobe nodule. 7 mm ring-enhancing LEFT posterior temporal lobe lesion, 3 mm homogeneously enhancing LEFT parietal metastasis. 3 mm LEFT cerebellar nodule. No abnormal extra-axial fluid collections. No extra-axial masses. There is also right parietal calvarial metastasis.  The patient is seen today, at the request of Dr. GAlvy Bimler for discussion of the role of palliative radiotherapy to assist with pain control for severe low back pain as well as treatment of brain metastasis.  PREVIOUS RADIATION THERAPY: No   PAST MEDICAL HISTORY:  Past Medical History:  Diagnosis Date  . Atherosclerosis of native arteries of  the extremities with ulceration(440.23)     . CAD (coronary artery disease)   . DVT (deep venous thrombosis) ([place])   . Follicular lymphoma ([place]) dx'd 05/2009   chemo R-CHOP comp 09/2009; maintenance  Rituxan from 11/2009 to 09/2011  . Hypertension   . Myocardial infarction   . Neuromuscular disorder ([place]) 02/2009  . Osteoporosis 08/17/2011       PAST SURGICAL HISTORY: Past Surgical History:  Procedure Laterality Date  . ANGIOPLASTY / STENTING FEMORAL  08/28/10   Left distal anastomosis, angioplasty w/stent by Dr. Adele Barthel  . APPENDECTOMY    . CHOLECYSTECTOMY     Gall bladder  . CORONARY ANGIOPLASTY WITH STENT PLACEMENT    . FEMORAL-POPLITEAL BYPASS GRAFT  02/15/10   Left CFA to above knee popliteal BPG by Dr. Adele Barthel  . FEMUR IM NAIL  04/05/2011   Procedure: INTRAMEDULLARY (IM) NAIL FEMORAL;  Surgeon: Nita Sells, MD;  Location: [facility];  Service: Orthopedics;  Laterality: Left;  Left hip trochanteric nail  . TOE AMPUTATION  03/13/10   Left Great toe by Dr. Adele Barthel  . TONSILLECTOMY       FAMILY HISTORY:  Family History  Problem Relation Age of Onset  . Heart attack Father   . Cancer Brother     lung ca     SOCIAL HISTORY:  reports that she has been smoking Cigarettes.  She has a 50.00 pack-year smoking history. She has never used smokeless tobacco. She reports that she does not drink alcohol or use drugs.   ALLERGIES: Patient has no known allergies.   MEDICATIONS:  Current Facility-Administered Medications  Medication Dose Route Frequency Provider Last Rate Last Dose  . acetaminophen (TYLENOL) tablet 650 mg  650 mg Oral QID Acquanetta Chain, DO   650 mg at 07/18/16 1407  . aspirin EC tablet 81 mg  81 mg Oral Daily [name], MD   81 mg at 07/18/16 0907  . atorvastatin (LIPITOR) tablet 80 mg  80 mg Oral Daily [name], MD   80 mg at 07/18/16 0907  . bisacodyl (DULCOLAX) EC tablet 5 mg  5 mg Oral Daily PRN Acquanetta Chain, DO      . calcitonin (salmon) (MIACALCIN/FORTICAL) nasal spray  1 spray  1 spray Alternating Nares Daily Acquanetta Chain, DO   1 spray at 07/18/16 0908  . dexamethasone (DECADRON) injection 4 mg  4 mg Intravenous Q6H Ni Gorsuch, MD   4 mg at 07/18/16 1139  . enoxaparin (LOVENOX) injection 40 mg  40 mg Subcutaneous Q24H Royetta Asal, RPH   40 mg at 07/18/16 0950  . feeding supplement (ENSURE ENLIVE) (ENSURE ENLIVE) liquid 237 mL  237 mL Oral BID BM Ni Gorsuch, MD   237 mL at 07/16/16 1000  . ipratropium-albuterol (DUONEB) 0.5-2.5 (3) MG/3ML nebulizer solution 3 mL  3 mL Nebulization BID [name], MD   3 mL at 07/18/16 0934  . magnesium oxide (MAG-OX) tablet 400 mg  400 mg Oral BID [name], MD   400 mg at 07/18/16 0907  . metoCLOPramide (REGLAN) injection 5 mg  5 mg Intravenous Q12H Acquanetta Chain, DO   5 mg at 07/18/16 0950  . metoprolol (LOPRESSOR) tablet 50 mg  50 mg Oral BID [name], MD   50 mg at 07/18/16 0907  . multivitamin with minerals tablet 1 tablet  1 tablet Oral Daily [name], MD   1 tablet at 07/18/16 0907  . nitroGLYCERIN (NITROSTAT) SL tablet 0.4  mg  0.4 mg Sublingual Q5 min PRN [name], MD      . ondansetron (ZOFRAN) injection 4 mg  4 mg Intravenous Q8H Acquanetta Chain, DO   4 mg at 07/18/16 1407  . polyethylene glycol (MIRALAX / GLYCOLAX) packet 17 g  17 g Oral Daily [name], MD   17 g at 07/18/16 0907  . prochlorperazine (COMPAZINE) injection 10 mg  10 mg Intravenous Q6H PRN Chauncey Cruel, MD   10 mg at 07/14/16 1017  . senna-docusate (Senokot-S) tablet 1 tablet  1 tablet Oral BID [name], MD   1 tablet at 07/18/16 0907  . zolpidem (AMBIEN) tablet 5 mg  5 mg Oral Once Curt Bears, MD      . zolpidem [facility]) tablet 5 mg  5 mg Oral QHS PRN [name], MD   5 mg at 07/17/16 2137     REVIEW OF SYSTEMS: On review of systems, the patient reports that she is doing well overall.  She denies any chest pain, shortness of breath, cough, fevers, chills, or night sweats.  She has had recent unintended weight  loss. Denies headaches, blurred or double vision.  She denies bowel or bladder disturbances, and denies abdominal pain, nausea or vomiting. Over the past 2 weeks she has been having episodes of " right knee buckling" and had been using a walker for ambulation for fear of falling. She reports radiation of pain from her lower back into the right lower extremity to the level of the right knee. She has noticed weakness in the bilateral lower extremities, right greater than left. She denies any numbness, tingling or loss of sensation in the lower extremities.  She denies loss of bowel or bladder control.  She reports that her back pain has significantly improved since the time of her admission. She is now able to ambulate to the restroom and transfer from bed to chair comfortably.  A complete review of systems is obtained and is otherwise negative.   PHYSICAL EXAM:  Wt Readings from Last 3 Encounters:  07/13/16 150 lb (68 kg)  07/13/16 150 lb 3.2 oz (68.1 kg)  06/01/16 153 lb 9.6 oz (69.7 kg)   Temp Readings from Last 3 Encounters:  07/18/16 97.8 F (36.6 C) (Oral)  07/13/16 98 F (36.7 C) (Oral)  03/23/16 98.2 F (36.8 C) (Oral)   BP Readings from Last 3 Encounters:  07/18/16 125/66  07/13/16 (!) 152/66  06/01/16 140/66   Pulse Readings from Last 3 Encounters:  07/18/16 91  07/13/16 81  06/01/16 69   Pain Assessment Pain Score: 0-No pain/10  In general this is a well appearing Caucasian female in no acute distress. She is alert and oriented x4 and appropriate throughout the examination. HEENT reveals that the patient is normocephalic, atraumatic. EOMs are intact. PERRLA. Skin is intact without any evidence of gross lesions. Cardiovascular exam reveals a regular rate and rhythm, no clicks rubs or murmurs are auscultated. Chest is clear to auscultation bilaterally. Lymphatic assessment is performed and does not reveal any adenopathy in the cervical, supraclavicular, axillary, or inguinal  chains. Abdomen has active bowel sounds in all quadrants and is intact. The abdomen is soft, non tender, non distended. Lower extremities are negative for pretibial pitting edema, deep calf tenderness, cyanosis or clubbing.  She has weakness in bilateral lower extremities, R>L.  Sensation is intact in the bilateral LEs. She is tender to palpation over the lumbar spine.   ECOG = 3  0 -  Asymptomatic (Fully active, able to carry on all predisease activities without restriction)  1 - Symptomatic but completely ambulatory (Restricted in physically strenuous activity but ambulatory and able to carry out work of a light or sedentary nature. For example, light housework, office work)  2 - Symptomatic, <50% in bed during the day (Ambulatory and capable of all self care but unable to carry out any work activities. Up and about more than 50% of waking hours)  3 - Symptomatic, >50% in bed, but not bedbound (Capable of only limited self-care, confined to bed or chair 50% or more of waking hours)  4 - Bedbound (Completely disabled. Cannot carry on any self-care. Totally confined to bed or chair)  5 - Death   Eustace Pen MM, Creech RH, Tormey DC, et al. [phone]). "Toxicity and response criteria of the [facility] Group". [place] Oncol. 5 (6): 649-55    LABORATORY DATA:  Lab Results  Component Value Date   WBC 12.0 (H) 07/15/2016   HGB 10.1 (L) 07/15/2016   HCT 31.9 (L) 07/15/2016   MCV 92.2 07/15/2016   PLT 232 07/15/2016   Lab Results  Component Value Date   NA 140 07/15/2016   K 4.1 07/15/2016   CL 107 07/15/2016   CO2 27 07/15/2016   Lab Results  Component Value Date   ALT 17 07/15/2016   AST 41 07/15/2016   ALKPHOS 103 07/15/2016   BILITOT 0.7 07/15/2016      RADIOGRAPHY: Ct Chest W Contrast  Result Date: 07/14/2016 CLINICAL DATA:  Non-Hodgkin's lymphoma. Severe uncontrolled back pain with hypercalcemia and elevated LDH levels. Evaluate for recurrence. EXAM: CT  CHEST, ABDOMEN, AND PELVIS WITH CONTRAST TECHNIQUE: Multidetector CT imaging of the chest, abdomen and pelvis was performed following the standard protocol during bolus administration of intravenous contrast. CONTRAST:  35m ISOVUE-300 IOPAMIDOL (ISOVUE-300) INJECTION 61%, 1025mISOVUE-300 IOPAMIDOL (ISOVUE-300) INJECTION 61% COMPARISON:  Prior CTs 09/12/2012 FINDINGS: CT CHEST FINDINGS Cardiovascular: There is diffuse atherosclerosis of the aorta, great vessels and coronary arteries. No acute vascular findings are seen. The heart size is normal. There is no pericardial effusion. Mediastinum/Nodes: There is new mediastinal and right hilar lymphadenopathy. Right paratracheal node measures 2.9 x 2.4 cm on image 25. There is a subcarinal node measuring 2.6 x 1.6 cm on image 31 and a right hilar node measuring 2.0 x 1.3 cm on image 28. There is a large paramediastinal mass adjacent to the ascending aorta which is probably a pulmonary lesion, described below. The thyroid gland, trachea and esophagus demonstrate no significant findings. Lungs/Pleura: There is no pleural effusion. There is dependent bibasilar atelectasis. As above, there is a large right upper lobe mass abutting the anterior mediastinum. This measures 5.9 x 4.2 cm on image 70. Additional pulmonary nodules are present, including a 1.2 cm right lower lobe nodule on image 106. Some of these are ground-glass, including ill-defined components in the left upper lobe which have progressed compared with the prior study. Mild underlying emphysema noted. Musculoskeletal/Chest wall: Mild thoracic spine compression deformities are stable. There are no worrisome osseous findings. There is a new soft tissue nodule in the subcutaneous fat of the right upper back, overlying the right scapula. This measures 11 mm on image 16. CT ABDOMEN AND PELVIS FINDINGS Hepatobiliary: There is a new dominant mass in the lateral segment of left hepatic lobe which measures 5.6 x 5.5 cm on  image 61. There are additional smaller lesions within the right lobe measuring 12 mm on image  54. There is a smaller lesion more inferiorly on image 63. There is chronic extrahepatic biliary dilatation status post cholecystectomy which appears similar to the prior study. Pancreas: The pancreas is atrophied. There is no evidence of pancreatic mass, surrounding inflammation or ductal dilatation. Spleen: Normal in size without focal abnormality. Adrenals/Urinary Tract: There is increased nodularity of the left adrenal gland which demonstrates a 1.9 x 1.4 cm nodule on image 62. The right adrenal gland appears normal. There is a new intermediate density nodule in the posterior aspect of the right kidney measuring 1.4 cm on image 22 of series 7. This has an indeterminate soft tissue attenuation. The kidneys otherwise appear stable with small cysts bilaterally. There is some cortical thinning and perinephric soft tissue stranding on the left. No evidence of hydronephrosis or ureteral calculus. The bladder appears unremarkable. Stomach/Bowel: No evidence of bowel wall thickening, distention or surrounding inflammatory change. The stomach is incompletely distended and suboptimally evaluated. Vascular/Lymphatic: There are no enlarged abdominal or pelvic lymph nodes. There is diffuse aortic and branch vessel atherosclerosis. No evidence of large vessel occlusion. Reproductive: The uterus is atrophied.  No evidence of adnexal mass. Other: There is a new left upper quadrant omental mass measuring 2.0 x 2.1 cm on image 71. No ascites or generalized peritoneal nodularity. Musculoskeletal: Interval development of a biconcave compression deformity at L5. No underlying lytic lesion is seen within the vertebral body, although there is a lytic lesion involving the right L4 transverse process, most obvious on the coronal images. Previous left proximal femoral ORIF noted. IMPRESSION: 1. There are multiple new lesions consistent with  metastatic disease. There is a dominant right upper lobe mass with additional bilateral pulmonary nodules, mediastinal and right hilar adenopathy, several hepatic lesions, a new left adrenal nodule, an omental peritoneal implant and at least 1 lytic osseous lesion. Findings could be secondary to recurrent multifocal lymphoma, although pattern is atypical for lymphoma, and primary lung cancer should be considered given the size of the right upper lobe lesion. 2. Additional subcutaneous nodule in the right upper back could reflect a metastasis. 3. Chronic lung disease with associated atelectasis and scattered ground-glass densities. 4. Indeterminate intermediate density lesion in the posterior interpolar region of the right kidney. 5. Age-indeterminate L5 compression fracture. Electronically Signed   By: Richardean Sale M.D.   On: 07/14/2016 11:59   [name] JO Contrast  Result Date: 07/17/2016 CLINICAL DATA:  History of lymphoma and metastatic lung cancer. Intermittent confusion, assess for intracranial metastasis. History of hypertension. EXAM: MRI HEAD WITHOUT AND WITH CONTRAST TECHNIQUE: Multiplanar, multiecho pulse sequences of the brain and surrounding structures were obtained without and with intravenous contrast. CONTRAST:  14 cc MultiHance COMPARISON:  None. FINDINGS: Post gadolinium sequences are moderately motion degraded. INTRACRANIAL CONTENTS: No reduced diffusion to suggest acute ischemia or intracranial hypercellular tumor. No susceptibility artifact to suggest hemorrhage. The ventricles and sulci are normal for patient's age. Confluent supratentorial and to lesser extent pontine white matter FLAIR T2 hyperintensities. Old bilateral putaminal lacunar infarcts. Ring-enhancing RIGHT frontal juxta cortical 8 mm lesion. Solidly enhancing subcentimeter RIGHT frontal/insular enhancing nodule. 3 mm RIGHT frontal lobe nodule. 4 mm LEFT frontal lobe nodule. 7 mm ring-enhancing LEFT posterior temporal lobe  lesion, 3 mm homogeneously enhancing LEFT parietal metastasis. 3 mm LEFT cerebellar nodule. No abnormal extra-axial fluid collections. No extra-axial masses. VASCULAR: T2 bright signal LEFT vertebral artery most compatible with occlusion. 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 Dolicoectatic intracranial vessels compatible chronic hypertension. SKULL AND UPPER CERVICAL  SPINE: No abnormal sellar expansion. Patchy reduced diffusion RIGHT parietal calvarium consistent with metastatic disease, associated expansile low T1 signal. No suspicious calvarial bone marrow signal. Craniocervical junction maintained. SINUSES/ORBITS: The mastoid air-cells and included paranasal sinuses are well-aerated.The included ocular globes and orbital contents are non-suspicious. OTHER: None. IMPRESSION: Limited assessment due to moderately motion degraded post gadolinium sequences. At least 6 supra and single infratentorial subcentimeter metastasis. RIGHT parietal calvarial metastasis. 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 Recommend CTA HEAD and neck for further characterization. Moderate to severe white matter changes compatible chronic small vessel ischemic disease and a component of vasogenic edema. 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 Electronically Signed   By: Elon Alas M.D.   On: 07/17/2016 17:21   Mr Lumbar Spine W Wo Contrast  Result Date: 07/17/2016 CLINICAL DATA:  Low back pain. History of lymphoma and metastatic lung cancer. EXAM: MRI LUMBAR SPINE WITHOUT AND WITH CONTRAST TECHNIQUE: Multiplanar and multiecho pulse sequences of the lumbar spine were obtained without and with intravenous contrast. CONTRAST:  5m MULTIHANCE GADOBENATE DIMEGLUMINE 529 MG/ML IV SOLN COMPARISON:  CT abdomen and pelvis July 04, 2016 FINDINGS: SEGMENTATION: For the purposes of this report, the last well-formed intervertebral disc will be described as L5-S1. ALIGNMENT: Maintenance of the lumbar lordosis. No  malalignment. VERTEBRAE: Low T1, bright STIR and enhancing lesions all lumbar levels. Moderate L5 pathologic fracture, approximate 50% height loss without retropulsed bony fragments. Expansile metastasis RIGHT L4 transverse process. Low signal metastasis T11 partially imaged. Multiple sacral metastasis. Intervertebral discs demonstrate normal morphology, mildly desiccation. Edema within the L5-S1 disc. No suspicious intradiscal enhancement. CONUS MEDULLARIS: Conus medullaris terminates at L1-2 and demonstrates normal morphology and signal characteristics. Cauda equina is normal. No suspicious cord or leptomeningeal enhancement. PARASPINAL AND SOFT TISSUES: Abnormal low signal partially imaged LEFT presacral soft tissues concerning for tumor extension, which could affect the sciatic nerve. Moderate symmetric paraspinal muscle atrophy. Mildly ectatic infrarenal aorta. DISC LEVELS: L1-2: No disc bulge. Mild facet arthropathy and ligamentum flavum redundancy without canal stenosis or neural foraminal narrowing. L2-3: Small broad-based disc bulge. Mild facet arthropathy and ligamentum flavum redundancy. Very mild canal stenosis without neural foraminal narrowing. L3-4: Small broad-based disc bulge. Mild facet arthropathy and ligamentum flavum redundancy. Very mild canal stenosis. No neural foraminal narrowing. L4-5: 5 mm broad-based disc bulge. Moderate facet arthropathy and ligamentum flavum redundancy including 3 mm RIGHT facet synovial cyst within dorsal epidural space. Severe canal stenosis in trefoil configuration. Mild bilateral caudal neural foraminal narrowing. Subcentimeter LEFT facet synovial cyst extending into paraspinal soft tissues. L5-S1: Small broad-based disc bulge. Moderate facet arthropathy and ligamentum flavum redundancy without canal stenosis. Minimal neural foraminal narrowing. IMPRESSION: Diffuse osseous metastasis.  Moderate L5 pathologic fracture. Degenerative lumbar spine resulting in severe  canal stenosis L4-5, very mild canal stenosis L2-3 and L3-4. Minimal to mild neural foraminal narrowing L4-5 and L5-S1. Low signal LEFT presacral soft tissues most consistent with tumoral extension which could affect the LEFT sciatic nerve. Electronically Signed   By: CElon AlasM.D.   On: 07/17/2016 16:57   Ct Abdomen Pelvis W Contrast  Result Date: 07/14/2016 CLINICAL DATA:  Non-Hodgkin's lymphoma. Severe uncontrolled back pain with hypercalcemia and elevated LDH levels. Evaluate for recurrence. EXAM: CT CHEST, ABDOMEN, AND PELVIS WITH CONTRAST TECHNIQUE: Multidetector CT imaging of the chest, abdomen and pelvis was performed following the standard protocol during bolus administration of intravenous contrast. CONTRAST:  348mISOVUE-300 IOPAMIDOL (ISOVUE-300) INJECTION 61%, 10036mSOVUE-300 IOPAMIDOL (ISOVUE-300) INJECTION 61% COMPARISON:  Prior CTs 09/12/2012 FINDINGS: CT CHEST FINDINGS Cardiovascular: There is diffuse atherosclerosis of the aorta, great vessels and coronary arteries. No acute vascular findings are seen. The heart size is normal. There is no pericardial effusion. Mediastinum/Nodes: There is new mediastinal and right hilar lymphadenopathy. Right paratracheal node measures 2.9 x 2.4 cm on image 25. There is a subcarinal node measuring 2.6 x 1.6 cm on image 31 and a right hilar node measuring 2.0 x 1.3 cm on image 28. There is a large paramediastinal mass adjacent to the ascending aorta which is probably a pulmonary lesion, described below. The thyroid gland, trachea and esophagus demonstrate no significant findings. Lungs/Pleura: There is no pleural effusion. There is dependent bibasilar atelectasis. As above, there is a large right upper lobe mass abutting the anterior mediastinum. This measures 5.9 x 4.2 cm on image 70. Additional pulmonary nodules are present, including a 1.2 cm right lower lobe nodule on image 106. Some of these are ground-glass, including ill-defined components in  the left upper lobe which have progressed compared with the prior study. Mild underlying emphysema noted. Musculoskeletal/Chest wall: Mild thoracic spine compression deformities are stable. There are no worrisome osseous findings. There is a new soft tissue nodule in the subcutaneous fat of the right upper back, overlying the right scapula. This measures 11 mm on image 16. CT ABDOMEN AND PELVIS FINDINGS Hepatobiliary: There is a new dominant mass in the lateral segment of left hepatic lobe which measures 5.6 x 5.5 cm on image 61. There are additional smaller lesions within the right lobe measuring 12 mm on image 54. There is a smaller lesion more inferiorly on image 63. There is chronic extrahepatic biliary dilatation status post cholecystectomy which appears similar to the prior study. Pancreas: The pancreas is atrophied. There is no evidence of pancreatic mass, surrounding inflammation or ductal dilatation. Spleen: Normal in size without focal abnormality. Adrenals/Urinary Tract: There is increased nodularity of the left adrenal gland which demonstrates a 1.9 x 1.4 cm nodule on image 62. The right adrenal gland appears normal. There is a new intermediate density nodule in the posterior aspect of the right kidney measuring 1.4 cm on image 22 of series 7. This has an indeterminate soft tissue attenuation. The kidneys otherwise appear stable with small cysts bilaterally. There is some cortical thinning and perinephric soft tissue stranding on the left. No evidence of hydronephrosis or ureteral calculus. The bladder appears unremarkable. Stomach/Bowel: No evidence of bowel wall thickening, distention or surrounding inflammatory change. The stomach is incompletely distended and suboptimally evaluated. Vascular/Lymphatic: There are no enlarged abdominal or pelvic lymph nodes. There is diffuse aortic and branch vessel atherosclerosis. No evidence of large vessel occlusion. Reproductive: The uterus is atrophied.  No  evidence of adnexal mass. Other: There is a new left upper quadrant omental mass measuring 2.0 x 2.1 cm on image 71. No ascites or generalized peritoneal nodularity. Musculoskeletal: Interval development of a biconcave compression deformity at L5. No underlying lytic lesion is seen within the vertebral body, although there is a lytic lesion involving the right L4 transverse process, most obvious on the coronal images. Previous left proximal femoral ORIF noted. IMPRESSION: 1. There are multiple new lesions consistent with metastatic disease. There is a dominant right upper lobe mass with additional bilateral pulmonary nodules, mediastinal and right hilar adenopathy, several hepatic lesions, a new left adrenal nodule, an omental peritoneal implant and at least 1 lytic osseous lesion. Findings could be secondary to recurrent multifocal lymphoma, although pattern is atypical for  lymphoma, and primary lung cancer should be considered given the size of the right upper lobe lesion. 2. Additional subcutaneous nodule in the right upper back could reflect a metastasis. 3. Chronic lung disease with associated atelectasis and scattered ground-glass densities. 4. Indeterminate intermediate density lesion in the posterior interpolar region of the right kidney. 5. Age-indeterminate L5 compression fracture. Electronically Signed   By: Richardean Sale M.D.   On: 07/14/2016 11:59   US Biopsy  Result Date: 07/16/2016 INDICATION: History of lymphoma, now with findings worrisome for a marked disease progression including development of a indeterminate nodule within the subcutaneous tissues about the right upper back. Please perform ultrasound-guided biopsy for tissue diagnostic purposes. EXAM: ULTRASOUND GUIDED LIVER LESION BIOPSY COMPARISON:  None. MEDICATIONS: None ANESTHESIA/SEDATION: None COMPLICATIONS: None immediate. PROCEDURE: Informed written consent was obtained from the patient after a discussion of the risks, benefits and  alternatives to treatment. The patient understands and consents the procedure. A timeout was performed prior to the initiation of the procedure. Ultrasound scanning was performed of the right upper abdominal quadrant demonstrates an approximately 1.0 x 1.0 cm hypoechoic nodule within the subcutaneous tissues of the right lateral upper back compatible the nodule seen on preceding chest CT image 16, series 2. The procedure was planned. The skin overlying the right lateral upper back was prepped and draped in the usual sterile fashion. The overlying soft tissues were anesthetized with 1% lidocaine with epinephrine. A 17 gauge, 6.8 cm co-axial needle was advanced into a peripheral aspect of the lesion. No fluid was able able to be aspirated from this hypoechoic nodule. As such, This was followed by 5 core biopsies with an 18 gauge core device under direct ultrasound guidance. The coaxial needle tract removed and superficial hemostasis was obtained with manual compression. Post procedural scanning was negative for definitive area of hemorrhage or additional complication. A dressing was placed. The patient tolerated the procedure well without immediate post procedural complication. IMPRESSION: Technically successful ultrasound guided core needle biopsy of indeterminate approximately 1 cm nodule within the subcutaneous tissues of the right upper back. Electronically Signed   By: Sandi Mariscal M.D.   On: 07/16/2016 16:31       IMPRESSION/PLAN: 1. Metastatic adenocarcinoma of the lung. We discussed the pathology findings and imaging studies to date.  We also discussed the role of radiotherapy in the treatment of metastatic lung disease with a focus on the delivery and logistics of radiotherapy. Given the severity of low back pain, we will plan to move forward with simulation/planning for treatment of the lumbar spine in our department on 07/19/16.  We will make arrangements for [facility] Protocol Brain MRI for further evaluation  and determination of candidacy for SRS treatment of the brain metastasis. Pending those results, we will make further recommendations for treatment of the brain metastasis.  Nicholos Johns, PA-C

## 2016-07-18 NOTE — Progress Notes (Signed)
Physical Therapy Treatment Patient Details Name: [name] MRN: [id number] DOB: [birth year] Today's Date: 07/18/2016    History of Present Illness 76 y.o. female  with past medical history of lymphoma admitted on 07/13/2016 with severe low back pain, hypercalcemia and fall at home with reported 3 weeks of pain and functional status decline. was admitted by Dr. Alvy Bimler for symptom management and for recurrent malignancy evaluation. CT of abdomen and chest showed widely metastatic cancer including what may be a new primary lung mass.    PT Comments    Pt progressing well with mobility, PT now recommending HHPT rather than SNF due to progress. She ambulated 180' with RW, no loss of balance, 3/10 R groin pain. Pt feels she can now manage at home as her pain is better controlled.    Follow Up Recommendations  Home health PT     Equipment Recommendations       Recommendations for Other Services       Precautions / Restrictions Precautions Precautions: Fall Restrictions Weight Bearing Restrictions: No    Mobility  Bed Mobility Overal bed mobility: Needs Assistance Bed Mobility: Supine to Sit     Supine to sit: Modified independent (Device/Increase time);HOB elevated     General bed mobility comments: HOB up 25*  Transfers Overall transfer level: Needs assistance Equipment used: Rolling walker (2 wheeled) Transfers: Sit to/from Stand Sit to Stand: Min guard         General transfer comment: VCs for hand placement  Ambulation/Gait Ambulation/Gait assistance: Supervision Ambulation Distance (Feet): 180 Feet Assistive device: Rolling walker (2 wheeled) Gait Pattern/deviations: Step-through pattern;Decreased stride length   Gait velocity interpretation: at or above normal speed for age/gender General Gait Details: steady, no LOB   Stairs            Wheelchair Mobility    Modified Rankin (Stroke Patients Only)       Balance Overall balance  assessment: Needs assistance;History of Falls Sitting-balance support: Feet supported;No upper extremity supported Sitting balance-Leahy Scale: Good     Standing balance support: During functional activity;Bilateral upper extremity supported Standing balance-Leahy Scale: Fair                      Cognition Arousal/Alertness: Awake/alert Behavior During Therapy: WFL for tasks assessed/performed Overall Cognitive Status: Within Functional Limits for tasks assessed                      Exercises      General Comments        Pertinent Vitals/Pain Pain Assessment: 0-10 Pain Score: 3  Pain Location: R groin Pain Descriptors / Indicators: Aching Pain Intervention(s): Limited activity within patient's tolerance;Monitored during session;Premedicated before session    Home Living                      Prior Function            PT Goals (current goals can now be found in the care plan section) Acute Rehab PT Goals Patient Stated Goal: to go home, pt likes to read and watch soaps PT Goal Formulation: With patient Time For Goal Achievement: 07/30/16 Potential to Achieve Goals: Good Progress towards PT goals: Progressing toward goals    Frequency    Min 3X/week      PT Plan Current plan remains appropriate    Co-evaluation             End of Session Equipment  Utilized During Treatment: Gait belt Activity Tolerance: Patient tolerated treatment well Patient left: in chair;with call bell/phone within reach;with chair alarm set Nurse Communication: Mobility status PT Visit Diagnosis: Unsteadiness on feet (R26.81);Muscle weakness (generalized) (M62.81);History of falling (Z91.81);Pain Pain - Right/Left: Right Pain - part of body: Hip     Time: 3016-0109 PT Time Calculation (min) (ACUTE ONLY): 17 min  Charges:  $Gait Training: 8-22 mins                    G Codes:       Blondell Reveal Kistler 07/18/2016, 1:30 PM [phone]

## 2016-07-18 NOTE — Clinical Social Work Note (Signed)
Clinical Social Work Assessment  Patient Details  Name: [name] MRN: [id number] Date of Birth: 1940-10-26  Date of referral:  07/18/16               Reason for consult:  Discharge Planning                Permission sought to share information with:  Chartered certified accountant granted to share information::  Yes, Verbal Permission Granted  Name::        Agency::     Relationship::     Contact Information:     Housing/Transportation Living arrangements for the past 2 months:  Single Family Home Source of Information:  Patient Patient Interpreter Needed:  None Criminal Activity/Legal Involvement Pertinent to Current Situation/Hospitalization:  No - Comment as needed Significant Relationships:  Adult Children Lives with:  Adult Children Do you feel safe going back to the place where you live?   (MD recommends SNF) Need for family participation in patient care:  Yes (Comment)  Care giving concerns: Pt's care may not be able to be managed at home following hospital d/c.   Social Worker assessment / plan:  Pt hospitalized from home on 07/13/16 with severe, uncontrolled pain.  CSW consulted to assist with SNF placement. PT eval is pending. CSW met with pt to review MD recommendation. Pt reports that she will consider SNF option and gave CSW permission to initiate SNF search. SNF bed offers are pending. Pt reports that her daughter will visit this afternoon and she will speak with her about placement. CSW has left CSW cell # with pt to have daughter call CSW for assistance with d/c planning.  Employment status:  Retired Nurse, adult PT Recommendations:   (PT eval pending) Information / Referral to community resources:  [place]  Patient/Family's Response to care:  To be determined  Patient/Family's Understanding of and Emotional Response to Diagnosis, Current Treatment, and Prognosis:  Pt is very tearful when speaking  of her cancer dx.  Support provided.  Emotional Assessment Appearance:  Appears stated age Attitude/Demeanor/Rapport:  Other (cooperative) Affect (typically observed):  Sad, Tearful/Crying Orientation:  Oriented to Self, Oriented to Place, Oriented to  Time, Oriented to Situation Alcohol / Substance use:  Not Applicable Psych involvement (Current and /or in the community):  No (Comment)  Discharge Needs  Concerns to be addressed:  Discharge Planning Concerns, Adjustment to Illness Readmission within the last 30 days:  No Current discharge risk:  None Barriers to Discharge:  No Barriers Identified   Luretha Rued, [place] 07/18/2016, 10:54 AM

## 2016-07-18 NOTE — NC FL2 (Signed)
[place] LEVEL OF CARE SCREENING TOOL     IDENTIFICATION  Patient Name: [name] Birthdate: [birth year] Sex: female Admission Date (Current Location): 07/13/2016  [facility] and [place] Number:  Herbalist and Address:  [facility],  [place] [address], [place]      Provider Number: [id number]  Attending Physician Name and Address:  [name], MD  Relative Name and Phone Number:       Current Level of Care: Hospital Recommended Level of Care: [place] Prior Approval Number:    Date Approved/Denied:   PASRR Number: [id number]  Discharge Plan: SNF    Current Diagnoses: Patient Active Problem List   Diagnosis Date Noted  . DNR (do not resuscitate) 07/16/2016  . Compression fracture of L5 lumbar vertebra ([place]) 07/16/2016  . Hypercalcemia 07/13/2016  . Acute back pain 07/13/2016  . Need for prophylactic vaccination and inoculation against influenza 04/17/2013  . 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 Osteoporosis 08/17/2011  . History of B-cell lymphoma   . PAD (peripheral artery disease) ([place]) 11/10/2010  . DVT 02/23/2010  . PVD 06/01/2009  . Essential hypertension, benign 04/20/2009  . CAD, NATIVE VESSEL 04/20/2009  . DYSLIPIDEMIA 04/12/2009  . AMI, INFERIOR WALL 04/12/2009  . DIASTOLIC DYSFUNCTION 60/73/7106  . ATHEROSCLEROSIS, NATIVE ARTERY, EXTREMITY, WITH CLAUDICATION 04/05/2009  . CAROTID BRUIT 04/05/2009    Orientation RESPIRATION BLADDER Height & Weight     Time, Situation, Self, Place  Normal Continent Weight: 150 lb (68 kg) Height:  '5\' 5"'$  (165.1 cm)  BEHAVIORAL SYMPTOMS/MOOD NEUROLOGICAL BOWEL NUTRITION STATUS  Other (Comment) (no behaviors)   Continent Diet  AMBULATORY STATUS COMMUNICATION OF NEEDS Skin   Limited Assist Verbally Normal                       Personal Care Assistance Level of Assistance  Bathing, Feeding, Dressing Bathing  Assistance: Limited assistance Feeding assistance: Independent Dressing Assistance: Limited assistance     Functional Limitations Info  Sight, Hearing, Speech Sight Info: Adequate Hearing Info: Adequate Speech Info: Adequate    SPECIAL CARE FACTORS FREQUENCY  PT (By licensed PT), OT (By licensed OT)     PT Frequency: 5x wk OT Frequency: 5x wk            Contractures Contractures Info: Not present    Additional Factors Info  Code Status Code Status Info: DNR             Current Medications (07/18/2016):  This is the current hospital active medication list Current Facility-Administered Medications  Medication Dose Route Frequency Provider Last Rate Last Dose  . acetaminophen (TYLENOL) tablet 650 mg  650 mg Oral QID Acquanetta Chain, DO   650 mg at 07/18/16 2694  . amLODipine (NORVASC) tablet 10 mg  10 mg Oral Daily [name], MD   10 mg at 07/18/16 0907  . aspirin EC tablet 81 mg  81 mg Oral Daily [name], MD   81 mg at 07/18/16 0907  . atorvastatin (LIPITOR) tablet 80 mg  80 mg Oral Daily [name], MD   80 mg at 07/18/16 0907  . bisacodyl (DULCOLAX) EC tablet 5 mg  5 mg Oral Daily PRN Acquanetta Chain, DO      . calcitonin (salmon) (MIACALCIN/FORTICAL) nasal spray 1 spray  1 spray Alternating Nares Daily Acquanetta Chain, DO   1 spray at 07/18/16 0908  .  dexamethasone (DECADRON) injection 4 mg  4 mg Intravenous Q6H [name], MD   4 mg at 07/18/16 6579  . enoxaparin (LOVENOX) injection 40 mg  40 mg Subcutaneous Q24H Royetta Asal, RPH   40 mg at 07/18/16 0950  . feeding supplement (ENSURE ENLIVE) (ENSURE ENLIVE) liquid 237 mL  237 mL Oral BID BM Ni Gorsuch, MD   237 mL at 07/16/16 1000  . HYDROmorphone (DILAUDID) injection 1 mg  1 mg Intravenous Q2H PRN [name], MD   1 mg at 07/17/16 1726  . HYDROmorphone (DILAUDID) tablet 2 mg  2 mg Oral Q2H PRN [name], MD   2 mg at 07/17/16 0383  . ipratropium-albuterol (DUONEB) 0.5-2.5 (3) MG/3ML nebulizer  solution 3 mL  3 mL Nebulization BID [name], MD   3 mL at 07/18/16 0934  . magnesium oxide (MAG-OX) tablet 400 mg  400 mg Oral BID [name], MD   400 mg at 07/18/16 0907  . metoCLOPramide (REGLAN) injection 5 mg  5 mg Intravenous Q12H Acquanetta Chain, DO   5 mg at 07/18/16 0950  . metoprolol (LOPRESSOR) tablet 50 mg  50 mg Oral BID [name], MD   50 mg at 07/18/16 0907  . multivitamin with minerals tablet 1 tablet  1 tablet Oral Daily [name], MD   1 tablet at 07/18/16 0907  . nitroGLYCERIN (NITROSTAT) SL tablet 0.4 mg  0.4 mg Sublingual Q5 min PRN [name], MD      . ondansetron (ZOFRAN) injection 4 mg  4 mg Intravenous Q8H Acquanetta Chain, DO   4 mg at 07/18/16 3383  . polyethylene glycol (MIRALAX / GLYCOLAX) packet 17 g  17 g Oral Daily [name], MD   17 g at 07/18/16 0907  . prochlorperazine (COMPAZINE) injection 10 mg  10 mg Intravenous Q6H PRN Chauncey Cruel, MD   10 mg at 07/14/16 1017  . senna-docusate (Senokot-S) tablet 1 tablet  1 tablet Oral BID [name], MD   1 tablet at 07/18/16 0907  . zolpidem (AMBIEN) tablet 5 mg  5 mg Oral Once Curt Bears, MD      . zolpidem [facility]) tablet 5 mg  5 mg Oral QHS PRN [name], MD   5 mg at 07/17/16 2137     Discharge Medications: Please see discharge summary for a list of discharge medications.  Relevant Imaging Results:  Relevant Lab Results:   Additional Information SS # [id number]. Pt will needs transportation assistance to chemo appts.  Taila Basinski, Randall An, LCSW

## 2016-07-18 NOTE — Progress Notes (Signed)
I have reviewed her MRI in detail, and also the pathology report.   1. Newly Diagnosed Adenocarcinoma 2. Diffuse Osseous Mets 3. 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 Her prognosis could be weeks at best and I anticipate her debility to not be amenable to extensive rehab and we need to ask her how she wants to spend the time she has left- SNF level care may not be in line with her QOL goals and I am not sure if it makes since to put her through painful rehab given MRI findings- I doubt with the dramatic metastatic disease that improving her functional status is a realistic goal.  1. Recommend starting Decadron for bone pain and nerve compression 2. Full comfort care may be the best course of action-need to consider investment in further interventions ike radiation carefully as it may interfere with her ability to obtain hospice services and have unintended consequences. 3. Continue to treat constipation 4. Depending on her response and tolerability to decadron may consider Interventional Pain options for the sciatic nerve especially.  I have not discussed the MRI results or pathology finding with [name]-would like to discuss how to proceed with Dr. Alvy Bimler. Will need to first determine what is most important to [name] given these results and then make our best recommendations on how to proceed.   Lane Hacker, DO Palliative Medicine [phone]

## 2016-07-18 NOTE — Progress Notes (Signed)
We spoke with the patient to discuss the findings of the brain MRI and recommendations to move forward with 3T MRI to determine if she is a candidate for [facility] treatment. The patient does not have brain edema fortunately as a result. We anticipate simulation on Friday or next Monday if she is a candidate for SRS, however still proceed tomorrow with simulation of her lumbar spine.      Carola Rhine, PAC

## 2016-07-19 ENCOUNTER — Other Ambulatory Visit: Payer: Self-pay | Admitting: *Deleted

## 2016-07-19 ENCOUNTER — Ambulatory Visit
Admission: RE | Admit: 2016-07-19 | Discharge: 2016-07-19 | Disposition: A | Discharge status: Home / Self Care | Payer: Medicare Other | Source: Ambulatory Visit | Attending: Radiation Oncology | Admitting: Radiation Oncology

## 2016-07-19 ENCOUNTER — Other Ambulatory Visit: Payer: Self-pay | Admitting: Radiation Oncology

## 2016-07-19 DIAGNOSIS — G4700 Insomnia, unspecified: Secondary | ICD-10-CM

## 2016-07-19 DIAGNOSIS — C7951 Secondary malignant neoplasm of bone: Secondary | ICD-10-CM

## 2016-07-19 DIAGNOSIS — K649 Unspecified hemorrhoids: Secondary | ICD-10-CM

## 2016-07-19 DIAGNOSIS — C7931 Secondary malignant neoplasm of brain: Secondary | ICD-10-CM

## 2016-07-19 LAB — COMPREHENSIVE METABOLIC PANEL
ALBUMIN: 2.9 g/dL — AB (ref 3.5–5.0)
ALK PHOS: 112 U/L (ref 38–126)
ALT: 22 U/L (ref 14–54)
ANION GAP: 7 (ref 5–15)
AST: 29 U/L (ref 15–41)
BILIRUBIN TOTAL: 0.4 mg/dL (ref 0.3–1.2)
BUN: 19 mg/dL (ref 6–20)
CALCIUM: 9 mg/dL (ref 8.9–10.3)
CO2: 29 mmol/L (ref 22–32)
CREATININE: 0.97 mg/dL (ref 0.44–1.00)
Chloride: 106 mmol/L (ref 101–111)
GFR calc non Af Amer: 55 mL/min — ABNORMAL LOW (ref >60)
GLUCOSE: 173 mg/dL — AB (ref 65–99)
Potassium: 4.5 mmol/L (ref 3.5–5.1)
Sodium: 142 mmol/L (ref 135–145)
TOTAL PROTEIN: 6 g/dL — AB (ref 6.5–8.1)

## 2016-07-19 MED ORDER — SODIUM CHLORIDE 0.9 % IV SOLN
8.0000 mg | Freq: Four times a day (QID) | INTRAVENOUS | Status: DC | PRN
  Filled 2016-07-19: qty 4

## 2016-07-19 MED ORDER — TRAMADOL HCL 50 MG PO TABS
50.0000 mg | ORAL_TABLET | Freq: Four times a day (QID) | ORAL | Status: DC | PRN
  Administered 2016-07-19 – 2016-07-23 (×5): 50 mg via ORAL
  Filled 2016-07-19 (×6): qty 1

## 2016-07-19 MED ORDER — METOCLOPRAMIDE HCL 5 MG PO TABS
5.0000 mg | ORAL_TABLET | Freq: Two times a day (BID) | ORAL | Status: DC
  Administered 2016-07-19 – 2016-07-24 (×10): 5 mg via ORAL
  Filled 2016-07-19 (×10): qty 1

## 2016-07-19 MED ORDER — DEXAMETHASONE 4 MG PO TABS
2.0000 mg | ORAL_TABLET | Freq: Two times a day (BID) | ORAL | Status: DC
  Administered 2016-07-19 – 2016-07-20 (×3): 2 mg via ORAL
  Filled 2016-07-19 (×3): qty 1

## 2016-07-19 MED ORDER — LORAZEPAM 0.5 MG PO TABS
0.2500 mg | ORAL_TABLET | Freq: Once | ORAL | Status: AC
  Administered 2016-07-19: 0.25 mg via ORAL
  Filled 2016-07-19: qty 1

## 2016-07-19 MED ORDER — PRAMOXINE HCL 1 % RE FOAM
Freq: Three times a day (TID) | RECTAL | Status: DC | PRN
  Filled 2016-07-19: qty 15

## 2016-07-19 MED ORDER — LORAZEPAM 0.5 MG PO TABS: ORAL_TABLET | ORAL | 0 refills | Status: DC

## 2016-07-19 NOTE — Progress Notes (Signed)
Daily Progress Note   Patient Name: [name]       Date: 07/19/2016 DOB: 01/27/1941  Age: 76 y.o. MRN#: [id number] Attending Physician: [name], MD Primary Care Physician: No PCP Per Patient Admit Date: 07/13/2016  Reason for Consultation/Follow-up: Disposition, Establishing goals of care, Non pain symptom management, Pain control and Psychosocial/spiritual support  Length of Stay: 6  Current Medications: Scheduled Meds:  . acetaminophen  650 mg Oral QID  . aspirin EC  81 mg Oral Daily  . atorvastatin  80 mg Oral Daily  . calcitonin (salmon)  1 spray Alternating Nares Daily  . clopidogrel  75 mg Oral Daily  . dexamethasone  2 mg Oral Q12H  . enoxaparin (LOVENOX) injection  40 mg Subcutaneous Q24H  . feeding supplement (ENSURE ENLIVE)  237 mL Oral BID BM  . magnesium oxide  400 mg Oral BID  . metoCLOPramide  5 mg Oral BID  . metoprolol  50 mg Oral BID  . multivitamin with minerals  1 tablet Oral Daily  . pantoprazole  40 mg Oral Daily  . polyethylene glycol  17 g Oral Daily  . senna-docusate  1 tablet Oral BID    Continuous Infusions:   PRN Meds: bisacodyl, nitroGLYCERIN, ondansetron (ZOFRAN) IV, pramoxine, prochlorperazine, traMADol, zolpidem  Physical Exam          Vital Signs: BP 129/66 (BP Location: Right Arm)   Pulse 73   Temp 97.9 F (36.6 C) (Oral)   Resp 16   Ht '5\' 5"'$  (1.651 m)   Wt 68 kg (150 lb)   SpO2 94%   BMI 24.96 kg/m  SpO2: SpO2: 94 % O2 Device: O2 Device: Not Delivered O2 Flow Rate:    Intake/output summary:  Intake/Output Summary (Last 24 hours) at 07/19/16 1328 Last data filed at 07/19/16 0756  Gross per 24 hour  Intake              810 ml  Output                0 ml  Net              810 ml   LBM: Last BM Date:  07/18/16 Baseline Weight: Weight: 68 kg (150 lb) Most recent weight: Weight: 68 kg (150 lb)   Patient Active Problem  List   Diagnosis Date Noted  . DNR (do not resuscitate) 07/16/2016  . Compression fracture of L5 lumbar vertebra ([place]) 07/16/2016  . Hypercalcemia 07/13/2016  . Acute back pain 07/13/2016  . Need for prophylactic vaccination and inoculation against influenza 04/17/2013  . Chronic total occlusion of artery of the extremities ([place]) 09/21/2011  . Osteoporosis 08/17/2011  . History of B-cell lymphoma   . PAD (peripheral artery disease) ([place]) 11/10/2010  . DVT 02/23/2010  . PVD 06/01/2009  . Essential hypertension, benign 04/20/2009  . CAD, NATIVE VESSEL 04/20/2009  . DYSLIPIDEMIA 04/12/2009  . AMI, INFERIOR WALL 04/12/2009  . DIASTOLIC DYSFUNCTION 69/67/8938  . ATHEROSCLEROSIS, NATIVE ARTERY, EXTREMITY, WITH CLAUDICATION 04/05/2009  . CAROTID BRUIT 04/05/2009    Palliative Care Assessment & Plan   Patient Profile: 76 yo newly diagnosed adenocarcinoma, presumably lung, genotype pending who on presentation had severe intractable back pain and was found to have widely metastatic disease including a large presacral mass and an L5 compression from tumor infiltration.  Assessment: In general Missi looks much better today- her nausea is under control and she tells me the steroids have helped more than anything.  Dr. Alvy Bimler discussed findings and goals of care with them in detail yesterday. I followed up today to provide support and to also do some planning for the future: plan for the worst and hope for the best approach.  Daughter Cecille Rubin was at the bediside-they are very much still processing the serious new and contemplating options-I did begin a discussion about hospice and encouraged them to think about how they wanted their time to spent with whatever time Tymber has left. I also discussed possible obstacles and pitfalls moving forward in the post-acute care  setting.  Esme lives with her daughter Cecille Rubin but Cecille Rubin works M-F 8 hours a day and is worried about her mother's safety during the day. She also feels her mother is deconditioned and needs ongoing therapy to maximize her functional status. She also needs ongoing pain management and symptom control.  Recommendations/Plan:  Question if patient would benefit from short acute rehab stay at CIR?? She may benefit from assistive devices for safety. She has foot drop and also subtle neurological changes related to brain mets.   Ultimately her goal is to be at home and she is very much open to hospice services. she is also thinking very hard on how much she wants to go through- during our meeting it became very apparent that she had been withholding so much of her suffering from her daughter and also was having issues with coordination, word finding and speaking/talking on the phone. She tells me her pain was "unspeakable" and there is only so much that she can "continue to bear".  She seems to think the steroids have made a big difference- we have room to go up on these if needed.  Plans are in place for her to begin radiation, we talked about managing side effects from radiation.  If no CIR then they [name]. She will need palliative care follow there and help transitioning into hospice.  Changed Reglan to PO  I talked with her daughter about pain management- I doubt tramadol will be sufficient moving forward but we can adjust opioids as needed based on her pain and symptoms. Her daughter is worried that the pain meds are making her confused and too groggy.  Goals of Care and Additional Recommendations:  Limitations on Scope of Treatment: Full Scope Treatment  Code Status:  Code Status Orders        Start     Ordered   07/15/16 0805  Do not attempt resuscitation (DNR)  Continuous    Question Answer Comment  In the event of cardiac or respiratory ARREST Do not call a  "code blue"   In the event of cardiac or respiratory ARREST Do not perform Intubation, CPR, defibrillation or ACLS   In the event of cardiac or respiratory ARREST Use medication by any route, position, wound care, and other measures to relive pain and suffering. May use oxygen, suction and manual treatment of airway obstruction as needed for comfort.      07/15/16 0804    Code Status History    Date Active Date Inactive Code Status Order ID Comments User Context   07/13/2016  3:44 PM 07/15/2016  8:04 AM Full Code [id number]  [name], MD Inpatient       Prognosis:   < 3 months  Discharge Planning:  Short term rehab CIR or SNF with plan to transition into hospice care  Care plan was discussed with patient, daughter, CSW, Dr. Alvy Bimler  Thank you for allowing the Palliative Medicine Team to assist in the care of this patient.  Time: 90 minutes     Greater than 50%  of this time was spent counseling and coordinating care related to the above assessment and plan.  Elia Nunley, DO  Please contact Palliative Medicine Team phone at [phone] for questions and concerns.

## 2016-07-19 NOTE — Progress Notes (Signed)
PT Cancellation Note  Patient Details Name: [name] MRN: [id number] DOB: Oct 13, 1940   Cancelled Treatment:    Reason Eval/Treat Not Completed: Patient at procedure or test/unavailable (pt at radiation simulation, will attempt again tomorrow. )   Philomena Doheny 07/19/2016, 2:49 PM [phone]

## 2016-07-19 NOTE — Progress Notes (Signed)
[name]   DOB:1940/10/30   SP#:[id number]    Subjective: She has some mild pain last night.  She is currently taking scheduled Tylenol.  She has no further bowel movement since yesterday.  She does not sleep well yesterday.  She has mild hemorrhoidal irritation  Objective:  Vitals:   07/18/16 2139 07/19/16 0600  BP: 118/70 129/66  Pulse: 99 73  Resp: 18 16  Temp: 98.3 F (36.8 C) 97.9 F (36.6 C)     Intake/Output Summary (Last 24 hours) at 07/19/16 0719 Last data filed at 07/19/16 0300  Gross per 24 hour  Intake              810 ml  Output                0 ml  Net              810 ml    GENERAL:alert, no distress and comfortable SKIN: skin color, texture, turgor are normal, no rashes or significant lesions EYES: normal, Conjunctiva are pink and non-injected, sclera clear Musculoskeletal:no cyanosis of digits and no clubbing  NEURO: alert & oriented x 3 with fluent speech, no focal motor/sensory deficits   Labs:  Lab Results  Component Value Date   WBC 12.0 (H) 07/15/2016   HGB 10.1 (L) 07/15/2016   HCT 31.9 (L) 07/15/2016   MCV 92.2 07/15/2016   PLT 232 07/15/2016   NEUTROABS 8.0 (H) 07/15/2016    Lab Results  Component Value Date   NA 142 07/19/2016   K 4.5 07/19/2016   CL 106 07/19/2016   CO2 29 07/19/2016    Studies:  [name] QJ Contrast  Result Date: 07/18/2016 CLINICAL DATA:  76 y/o F; history of lymphoma and metastatic lung cancer. EXAM: MRI HEAD WITHOUT AND WITH CONTRAST TECHNIQUE: Multiplanar, multiecho pulse sequences of the brain and surrounding structures were obtained without and with intravenous contrast. CONTRAST:  43m MULTIHANCE GADOBENATE DIMEGLUMINE 529 MG/ML IV SOLN COMPARISON:  07/17/2016 MRI of the brain. FINDINGS: Brain: No diffusion signal abnormality. No abnormal susceptibility hypointensity to indicate intracranial hemorrhage. Confluent T2 FLAIR hyperintense signal abnormality throughout subcortical and periventricular white  matter is compatible with advanced chronic microvascular ischemic changes. Moderate brain parenchymal volume loss. Greater than 20 enhancing metastasis are identified scattered throughout the supratentorial brain and there additional metastasis in the left cerebellar hemisphere on the right anterior pons. The largest metastasis are present within the right anterolateral frontal lobe measuring 7 mm (series 10, image 91) and within the left lateral temporal lobe measuring 9 mm (series 10, image 83). There is minimal associated vasogenic edema with the metastasis. 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 Skull and upper cervical spine: Enhancing lesion in the right parietal bone is likely an osseous metastasis. Sinuses/Orbits: Negative. Other: None. IMPRESSION: 1. Greater than 20 supratentorial, 1 left cerebellar hemisphere, and 1 pons metastasis much better appreciated when compared with prior MRI of the brain. The largest lesion measures 9 mm in the left lateral temporal lobe. 2. No evidence of acute infarct, intracranial hemorrhage, or significant mass effect. 3. Stable advanced chronic microvascular ischemic changes and moderate brain parenchymal volume loss. 4. Stable enhancing lesion in right parietal bone likely metastasis. Electronically Signed   By: LKristine GarbeM.D.   On: 07/18/2016 19:32   Mr BJeri CosWFHContrast  Result Date: 07/17/2016 CLINICAL  DATA:  History of lymphoma and metastatic lung cancer. Intermittent confusion, assess for intracranial metastasis. History of hypertension. EXAM: MRI HEAD WITHOUT AND WITH CONTRAST TECHNIQUE: Multiplanar, multiecho pulse sequences of the brain and surrounding structures were obtained without and with intravenous contrast. CONTRAST:  14 cc MultiHance COMPARISON:  None. FINDINGS: Post gadolinium sequences are moderately motion degraded. INTRACRANIAL  CONTENTS: No reduced diffusion to suggest acute ischemia or intracranial hypercellular tumor. No susceptibility artifact to suggest hemorrhage. The ventricles and sulci are normal for patient's age. Confluent supratentorial and to lesser extent pontine white matter FLAIR T2 hyperintensities. Old bilateral putaminal lacunar infarcts. Ring-enhancing RIGHT frontal juxta cortical 8 mm lesion. Solidly enhancing subcentimeter RIGHT frontal/insular enhancing nodule. 3 mm RIGHT frontal lobe nodule. 4 mm LEFT frontal lobe nodule. 7 mm ring-enhancing LEFT posterior temporal lobe lesion, 3 mm homogeneously enhancing LEFT parietal metastasis. 3 mm LEFT cerebellar nodule. No abnormal extra-axial fluid collections. No extra-axial masses. VASCULAR: T2 bright signal LEFT vertebral artery most compatible with occlusion. 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 Dolicoectatic intracranial vessels compatible chronic hypertension. SKULL AND UPPER CERVICAL SPINE: No abnormal sellar expansion. Patchy reduced diffusion RIGHT parietal calvarium consistent with metastatic disease, associated expansile low T1 signal. No suspicious calvarial bone marrow signal. Craniocervical junction maintained. SINUSES/ORBITS: The mastoid air-cells and included paranasal sinuses are well-aerated.The included ocular globes and orbital contents are non-suspicious. OTHER: None. IMPRESSION: Limited assessment due to moderately motion degraded post gadolinium sequences. At least 6 supra and single infratentorial subcentimeter metastasis. RIGHT parietal calvarial metastasis. 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 Recommend CTA HEAD and neck for further characterization. Moderate to severe white matter changes compatible chronic small vessel ischemic disease and a component of vasogenic edema. 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 Electronically Signed   By: Elon Alas M.D.   On: 07/17/2016 17:21   Mr Lumbar Spine W Wo  Contrast  Result Date: 07/17/2016 CLINICAL DATA:  Low back pain. History of lymphoma and metastatic lung cancer. EXAM: MRI LUMBAR SPINE WITHOUT AND WITH CONTRAST TECHNIQUE: Multiplanar and multiecho pulse sequences of the lumbar spine were obtained without and with intravenous contrast. CONTRAST:  37m MULTIHANCE GADOBENATE DIMEGLUMINE 529 MG/ML IV SOLN COMPARISON:  CT abdomen and pelvis July 04, 2016 FINDINGS: SEGMENTATION: For the purposes of this report, the last well-formed intervertebral disc will be described as L5-S1. ALIGNMENT: Maintenance of the lumbar lordosis. No malalignment. VERTEBRAE: Low T1, bright STIR and enhancing lesions all lumbar levels. Moderate L5 pathologic fracture, approximate 50% height loss without retropulsed bony fragments. Expansile metastasis RIGHT L4 transverse process. Low signal metastasis T11 partially imaged. Multiple sacral metastasis. Intervertebral discs demonstrate normal morphology, mildly desiccation. Edema within the L5-S1 disc. No suspicious intradiscal enhancement. CONUS MEDULLARIS: Conus medullaris terminates at L1-2 and demonstrates normal morphology and signal characteristics. Cauda equina is normal. No suspicious cord or leptomeningeal enhancement. PARASPINAL AND SOFT TISSUES: Abnormal low signal partially imaged LEFT presacral soft tissues concerning for tumor extension, which could affect the sciatic nerve. Moderate symmetric paraspinal muscle atrophy. Mildly ectatic infrarenal aorta. DISC LEVELS: L1-2: No disc bulge. Mild facet arthropathy and ligamentum flavum redundancy without canal stenosis or neural foraminal narrowing. L2-3: Small broad-based disc bulge. Mild facet arthropathy and ligamentum flavum redundancy. Very mild canal stenosis without neural foraminal narrowing. L3-4: Small broad-based disc bulge. Mild facet arthropathy and ligamentum flavum redundancy. Very mild canal stenosis. No neural foraminal narrowing. L4-5: 5 mm broad-based disc bulge.  Moderate facet arthropathy and ligamentum flavum redundancy including 3 mm RIGHT facet synovial  cyst within dorsal epidural space. Severe canal stenosis in trefoil configuration. Mild bilateral caudal neural foraminal narrowing. Subcentimeter LEFT facet synovial cyst extending into paraspinal soft tissues. L5-S1: Small broad-based disc bulge. Moderate facet arthropathy and ligamentum flavum redundancy without canal stenosis. Minimal neural foraminal narrowing. IMPRESSION: Diffuse osseous metastasis.  Moderate L5 pathologic fracture. Degenerative lumbar spine resulting in severe canal stenosis L4-5, very mild canal stenosis L2-3 and L3-4. Minimal to mild neural foraminal narrowing L4-5 and L5-S1. Low signal LEFT presacral soft tissues most consistent with tumoral extension which could affect the LEFT sciatic nerve. Electronically Signed   By: Elon Alas M.D.   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 I have requested pathologist to add Foundation One testing; results will be available for 2-3 weeks I have consulted radiation oncologist for palliative radiation therapy to bone and brain, hopefully will start planning today  Cancer pain Back pain has resolved with addition of dexamethasone and tylenol This could be related to recent fall or cancer associated pain I have stopped Dilaudid per daughter request She may benefit from palliative radiation treatment Appreciate palliative care assistance on pain management She is getting scheduled Tylenol.  I have added tramadol as needed.  Her daughter felt that we should avoid narcotic due to patient's poor tolerance  Insomnia Likely due to dexamethasone. Given excellent response to treatment, I plan to reduce dexamethasone to 2 mg twice a day This would hopefully reduce problem with  insomnia  Hemorrhoidal irritation Due to chronic constipation I will prescribe sitz baths and Proctofoam  Hypercalcemia ofmalignancy She is on calcitonin This has resolved  Leukocytosis Cough, resolved I do not believe she have pneumonia. Likely, she have COPD exacerbation and related to compression of her airway from cancer She feels well on some nebulizer treatment and cough suppressant as needed  Severe constipation I encouraged the patient to take laxatives as prescribed. 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 Resume Plavix I have stopped amlodipine due to low blood pressure  Remote history of DVT We will start her on Lovenox for DVT prophylaxis, especially due to her poor mobility  Severe weakness She has been debilitated for over 3 weeks I will consult physical therapy; I continue to encourage her to mobilize as tolerated  Discharge planning Goals of CARE discussion We will consult palliative care to assist in goals of care discussion and symptom management The patient understood that if this is metastatic lung cancer, treatment would be strictly palliative in nature Patient and daughter agreed to proceed with palliative radiation treatment We will wait for Foundation One testing; if not targeted therapy is available, she will unlikely proceed with standard chemotherapy. Prognosis is discussed; without systemic chemo, survival is likely < 3 months We discussed referral to social worker for SNF placement while undergoing XRT Ready for discharge once placement is available  [name], MD 07/19/2016  7:19 AM

## 2016-07-20 DIAGNOSIS — R609 Edema, unspecified: Secondary | ICD-10-CM

## 2016-07-20 MED ORDER — DEXAMETHASONE 4 MG PO TABS
4.0000 mg | ORAL_TABLET | Freq: Two times a day (BID) | ORAL | Status: DC
  Administered 2016-07-20 – 2016-07-24 (×8): 4 mg via ORAL
  Filled 2016-07-20 (×8): qty 1

## 2016-07-20 MED ORDER — OXYCODONE HCL 5 MG PO TABS
5.0000 mg | ORAL_TABLET | ORAL | Status: DC | PRN
  Administered 2016-07-20 – 2016-07-24 (×15): 5 mg via ORAL
  Filled 2016-07-20 (×15): qty 1

## 2016-07-20 NOTE — Progress Notes (Signed)
[name]   DOB:23-Apr-1941   EU#:[id number]    Subjective: She has no bowel movement for almost 2 days.  Her appetite remains poor.  She is still in a lot of pain.  I witnessed the patient walking with assistance getting out of bed to the bathroom.  Her gait looks unsteady and antalgic  Objective:  Vitals:   07/19/16 2200 07/20/16 0435  BP: 137/74 (!) 166/72  Pulse: 74 72  Resp: 14 16  Temp: 97.8 F (36.6 C) 97.8 F (36.6 C)     Intake/Output Summary (Last 24 hours) at 07/20/16 0741 Last data filed at 07/20/16 0436  Gross per 24 hour  Intake              730 ml  Output                0 ml  Net              730 ml    GENERAL:alert, no distress and comfortable SKIN: skin color, texture, turgor are normal, no rashes or significant lesions EYES: normal, Conjunctiva are pink and non-injected, sclera clear Musculoskeletal:no cyanosis of digits and no clubbing  NEURO: alert & oriented x 3 with fluent speech, no focal motor/sensory deficits   Labs:  Lab Results  Component Value Date   WBC 12.0 (H) 07/15/2016   HGB 10.1 (L) 07/15/2016   HCT 31.9 (L) 07/15/2016   MCV 92.2 07/15/2016   PLT 232 07/15/2016   NEUTROABS 8.0 (H) 07/15/2016    Lab Results  Component Value Date   NA 142 07/19/2016   K 4.5 07/19/2016   CL 106 07/19/2016   CO2 29 07/19/2016    Studies:  Mr [name] XV Contrast  Result Date: 07/18/2016 CLINICAL DATA:  76 y/o F; history of lymphoma and metastatic lung cancer. EXAM: MRI HEAD WITHOUT AND WITH CONTRAST TECHNIQUE: Multiplanar, multiecho pulse sequences of the brain and surrounding structures were obtained without and with intravenous contrast. CONTRAST:  32m MULTIHANCE GADOBENATE DIMEGLUMINE 529 MG/ML IV SOLN COMPARISON:  07/17/2016 MRI of the brain. FINDINGS: Brain: No diffusion signal abnormality. No abnormal susceptibility hypointensity to indicate intracranial hemorrhage. Confluent T2 FLAIR hyperintense signal abnormality throughout subcortical  and periventricular white matter is compatible with advanced chronic microvascular ischemic changes. Moderate brain parenchymal volume loss. Greater than 20 enhancing metastasis are identified scattered throughout the supratentorial brain and there additional metastasis in the left cerebellar hemisphere on the right anterior pons. The largest metastasis are present within the right anterolateral frontal lobe measuring 7 mm (series 10, image 91) and within the left lateral temporal lobe measuring 9 mm (series 10, image 83). There is minimal associated vasogenic edema with the metastasis. 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 Skull and upper cervical spine: Enhancing lesion in the right parietal bone is likely an osseous metastasis. Sinuses/Orbits: Negative. Other: None. IMPRESSION: 1. Greater than 20 supratentorial, 1 left cerebellar hemisphere, and 1 pons metastasis much better appreciated when compared with prior MRI of the brain. The largest lesion measures 9 mm in the left lateral temporal lobe. 2. No evidence of acute infarct, intracranial hemorrhage, or significant mass effect. 3. Stable advanced chronic microvascular ischemic changes and moderate brain parenchymal volume loss. 4. Stable enhancing lesion in right parietal bone likely metastasis. Electronically Signed   By: LKristine GarbeM.D.   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 I have requested pathologist to add Foundation One testing; results will be available for 2-3 weeks I have consulted radiation oncologist for palliative radiation therapy to bone and brain, hopefully will start soon  Cancer pain Mild vasogenic edema Her falls could be related to recent fall and cancer  associated pain I have stopped Dilaudid per daughter request She may benefit from palliative radiation treatment Appreciate palliative care assistance on pain management She is getting scheduled Tylenol and dexamethasone.  I have added tramadol as needed.  Her daughter felt that we should avoid narcotic due to patient's poor tolerance  Insomnia Likely due to dexamethasone. I have reduced dexamethasone to 2 mg twice a day This would hopefully reduce problem with insomnia  Hemorrhoidal irritation Due to chronic constipation I have prescribed sitz baths and Proctofoam  Hypercalcemia ofmalignancy She is on calcitonin This has resolved  Leukocytosis Cough, resolved I do not believe she have pneumonia. Likely, she have COPD exacerbation and related to compression of her airway from cancer She feels well on some nebulizer treatment and cough suppressant as needed  Severe constipation I encouraged the patient to take laxatives as prescribed. She had received Relistor, tap water enema and dulcolax  CODE STATUS DNR  History of coronary artery disease status post myocardial infarction Resume Plavix I have stopped amlodipine due to low blood pressure  Remote history of DVT We will start her on Lovenox for DVT prophylaxis, especially due to her poor mobility  Severe weakness and physical deconditioning She has been debilitated for over 3 weeks I will consult physical therapy; I continue to encourage her to mobilize as tolerated She has signs of antalgic gait.  My sense that she will not benefit from home physical therapy. She needs to be in a skilled nursing facility to assist in mobility and to get intensive rehabilitation with adequate pain control. With significant heavy disease burden, evidence of multiple cerebral metastases and involvement nerve root at the lumbosacral region, her risk of fall is very very high.  I have personally witnessed her mobility getting out of  bed to the bathroom this morning with assistance.  There is no way she could have done it by herself at home without assistance.  Discharge planning Goals of CARE discussion We will consult palliative care to assist in goals of care discussion and symptom management The patient understood that if this is metastatic lung cancer, treatment would be strictly palliative in nature Patient and daughter agreed to proceed with palliative radiation treatment We will wait for Foundation One testing; if not targeted therapy is available, she will unlikely proceed with standard chemotherapy. Prognosis is discussed; without systemic chemo, survival is likely < 3 months We discussed referral to social worker for SNF placement while undergoing XRT Ready for discharge once placement is available If the patient remained hospitalized throughout the weekend, I will return to check on her again on Monday. Dr. Marin Olp will be rounding this weekend [name], MD 07/20/2016  7:41 AM

## 2016-07-20 NOTE — Progress Notes (Signed)
Inpatient Rehabilitation  Per discussion with Dr. Naaman Plummer, after he reviewed this case it is recommended that patient get home health level therapies with hospice support.  She is not a candidate for IP Rehab at this time given the intensity of this program.    I note that patient does not have 24/7 care and therapy has also recommended SNF level of rehab, which may be a good level of intensity for her.  Will notify team and sign off at this time.  Please call if questions.   Carmelia Roller., CCC/SLP Admission Coordinator  [place]  Cell [phone]

## 2016-07-20 NOTE — Progress Notes (Signed)
CSW continues to follow to assist with d/c planning. PT / OT agree with MD's recommendation for rehab. CIR has been consulted and a decision is pending. CSW will assist with SNF placement if CIR is unable to accept pt. CSW will continue to follow to asist with d/c planning.  [name] LCSW [phone]

## 2016-07-20 NOTE — Evaluation (Signed)
Occupational Therapy Evaluation Patient Details Name: JACQUILINE ZURCHER MRN: [id number] DOB: [birth year] Today's Date: 07/20/2016    History of Present Illness 76 y.o. female  with past medical history of lymphoma admitted on 07/13/2016 with severe low back pain, hypercalcemia and fall at home with reported 3 weeks of pain and functional status decline. was admitted by Dr. Alvy Bimler for symptom management and for recurrent malignancy evaluation. CT of abdomen and chest showed widely metastatic cancer including what may be a new primary lung mass.   Clinical Impression   This 76 year old female was admitted for the above.  She will benefit from continued OT to increase safety and independence with adls.  Pt is very independent natured and motivated. She will benefit from rehab to reach a mod I level and be able to manage at home when daughter is at work.  Goals in acute are for supervision level    Follow Up Recommendations  SNF;CIR    Equipment Recommendations  None recommended by OT    Recommendations for Other Services       Precautions / Restrictions Precautions Precautions: Fall Restrictions Weight Bearing Restrictions: No      Mobility Bed Mobility               General bed mobility comments: oob  Transfers Overall transfer level: Needs assistance Equipment used: Rolling walker (2 wheeled) Transfers: Sit to/from Stand Sit to Stand: Min guard         General transfer comment: cues for hand placement    Balance                                            ADL Overall ADL's : Needs assistance/impaired     Grooming: Wash/dry hands;Sitting;Set up   Upper Body Bathing: Set up;Sitting   Lower Body Bathing: Minimal assistance;Sit to/from stand   Upper Body Dressing : Set up;Sitting   Lower Body Dressing: Moderate assistance;Sit to/from stand   Toilet Transfer: Min guard;BSC;Ambulation   Toileting- Water quality scientist and Hygiene: Min  guard;Sit to/from stand         General ADL Comments: cues for safety for sit to stand; hand placement.  Pt normally crosses legs for ADLs--limited by pain this session, but she is very independent natured and she was trying to push through activities as much as possible.  Will introduce AE on next visit.     Vision         Perception     Praxis      Pertinent Vitals/Pain Pain Score: 5  Pain Location: back Pain Descriptors / Indicators: Aching Pain Intervention(s): Limited activity within patient's tolerance;Monitored during session;Repositioned;Patient requesting pain meds-RN notified     Hand Dominance     Extremity/Trunk Assessment Upper Extremity Assessment Upper Extremity Assessment: Generalized weakness           Communication Communication Communication: No difficulties   Cognition Arousal/Alertness: Awake/alert Behavior During Therapy: WFL for tasks assessed/performed Overall Cognitive Status: Within Functional Limits for tasks assessed                     General Comments       Exercises       Shoulder Instructions      Home Living Family/patient expects to be discharged to:: Private residence Living Arrangements: Children Available Help at Discharge: Family  Bathroom Shower/Tub: Occupational psychologist: Standard     Home Equipment: Bedside commode;Walker - 2 wheels          Prior Functioning/Environment Level of Independence: Independent with assistive device(s)                 OT Problem List: Decreased strength;Decreased activity tolerance;Pain;Decreased knowledge of use of DME or AE      OT Treatment/Interventions: Self-care/ADL training;DME and/or AE instruction;Patient/family education;Balance training;Energy conservation    OT Goals(Current goals can be found in the care plan section) Acute Rehab OT Goals Patient Stated Goal: return to independence OT Goal Formulation: With  patient Time For Goal Achievement: 07/27/16 Potential to Achieve Goals: Good ADL Goals Pt Will Perform Grooming: with supervision;standing Pt Will Perform Lower Body Bathing: with supervision;with adaptive equipment;sit to/from stand Pt Will Perform Lower Body Dressing: with supervision;with adaptive equipment;sit to/from stand Pt Will Transfer to Toilet: with supervision;ambulating;bedside commode Additional ADL Goal #1: pt will initiate rest break as needed for energy conservation   OT Frequency: Min 2X/week   Barriers to D/C:            Co-evaluation              End of Session Nurse Communication: Patient requests pain meds  Activity Tolerance: Patient limited by pain Patient left: in chair;with family/visitor present;with call bell/phone within reach  OT Visit Diagnosis: Pain;Muscle weakness (generalized) (M62.81) Pain - part of body:  (back)                ADL either performed or assessed with clinical judgement  Time: 5789-7847 OT Time Calculation (min): 15 min Charges:  OT General Charges $OT Visit: 1 Procedure OT Evaluation $OT Eval Low Complexity: 1 Procedure G-Codes:     Lesle Chris, OTR/L [phone] 07/20/2016  Aeva Posey 07/20/2016, 8:47 AM

## 2016-07-20 NOTE — Progress Notes (Addendum)
CSW assisting with d/c planning. CIR is unable to accept pt for rehab. [name], Cecille Rubin, contacted and updated. Daughter requested Dr. Hilma Favors be notified of CIR decision not to admit prior to proceeding with SNF placement.  CSW has left a VM on Palliative Care Team's phone. Awaiting return call.  [name] LCSW [phone]  15:46 Spoke with Dr.Golding this afternoon. Neither Dr. Alvy Bimler or Dr. Hilma Favors feel pt should be d/c to SNF until pt's pain is better managed. Dr. Hilma Favors has discussed this with pt's daughter. Possible re evaluation by CIR on Monday. Daughter's SNF choice is [facility] SNF has been updated and expects to have a SNF bed for pt on Monday if pt is ready for d/c and CIR is unable to accept pt. CSW will continue to follow to asist with d/c planning needs.  [name] LCSW [phone]

## 2016-07-20 NOTE — Progress Notes (Signed)
Physical Therapy Treatment Patient Details Name: [name] MRN: [id number] DOB: April 27, 1941 Today's Date: 07/20/2016    History of Present Illness 76 y.o. female  with past medical history of lymphoma admitted on 07/13/2016 with severe low back pain, hypercalcemia and fall at home with reported 3 weeks of pain and functional status decline. was admitted by Dr. Alvy Bimler for symptom management and for recurrent malignancy evaluation. CT of abdomen and chest showed widely metastatic cancer including what may be a new primary lung mass.    PT Comments    Pt continues pleasant and cooperative but ltd by pain and fatigues easily.  Pt would benefit from follow up rehab at SNF level to maximize IND and safety prior to return home with limited assist (has assist evenings only).   Follow Up Recommendations  SNF     Equipment Recommendations  None recommended by PT    Recommendations for Other Services OT consult     Precautions / Restrictions Precautions Precautions: Fall Restrictions Weight Bearing Restrictions: No    Mobility  Bed Mobility               General bed mobility comments: Pt up in chair  Transfers Overall transfer level: Needs assistance Equipment used: Rolling walker (2 wheeled) Transfers: Sit to/from Stand Sit to Stand: Min guard         General transfer comment: cues for hand placement  Ambulation/Gait Ambulation/Gait assistance: Min guard Ambulation Distance (Feet): 111 Feet Assistive device: Rolling walker (2 wheeled) Gait Pattern/deviations: Step-through pattern;Decreased stride length Gait velocity: decr Gait velocity interpretation: Below normal speed for age/gender General Gait Details: cues for posture and position from RW.  Pt reporting pain beginning to elevate upon return to room   Stairs            Wheelchair Mobility    Modified Rankin (Stroke Patients Only)       Balance Overall balance assessment: Needs  assistance;History of Falls Sitting-balance support: Feet supported;No upper extremity supported Sitting balance-Leahy Scale: Good     Standing balance support: No upper extremity supported Standing balance-Leahy Scale: Fair                      Cognition Arousal/Alertness: Awake/alert Behavior During Therapy: WFL for tasks assessed/performed Overall Cognitive Status: Within Functional Limits for tasks assessed                      Exercises      General Comments        Pertinent Vitals/Pain Pain Assessment: 0-10 Pain Score: 4  Pain Location: back Pain Descriptors / Indicators: Aching Pain Intervention(s): Limited activity within patient's tolerance;Monitored during session;Ice applied;Premedicated before session    Home Living                      Prior Function            PT Goals (current goals can now be found in the care plan section) Acute Rehab PT Goals Patient Stated Goal: return to independence PT Goal Formulation: With patient Time For Goal Achievement: 07/30/16 Potential to Achieve Goals: Good Progress towards PT goals: Not progressing toward goals - comment (More limited this am by pain/fatigue)    Frequency    Min 3X/week      PT Plan Discharge plan needs to be updated    Co-evaluation             End of  Session Equipment Utilized During Treatment: Gait belt Activity Tolerance: Patient limited by pain;Patient limited by fatigue Patient left: Other (comment) (bathroom with OT) Nurse Communication: Mobility status PT Visit Diagnosis: Unsteadiness on feet (R26.81);Muscle weakness (generalized) (M62.81);History of falling (Z91.81);Pain Pain - Right/Left: Right Pain - part of body: Hip     Time: 0805-0821 PT Time Calculation (min) (ACUTE ONLY): 16 min  Charges:  $Gait Training: 8-22 mins                    G Codes:       Josue Kass 07/28/2016, 12:46 PM

## 2016-07-20 NOTE — Progress Notes (Signed)
Inpatient Rehabilitation  I have received the IP Rehab consult order and have requested that Dr. Naaman Plummer review the case today.  Plan to follow up later today.  Please call with questions.   Carmelia Roller., CCC/SLP Admission Coordinator  [place]  Cell [phone]

## 2016-07-21 DIAGNOSIS — C3490 Malignant neoplasm of unspecified part of unspecified bronchus or lung: Secondary | ICD-10-CM

## 2016-07-21 DIAGNOSIS — C7989 Secondary malignant neoplasm of other specified sites: Secondary | ICD-10-CM

## 2016-07-21 NOTE — Progress Notes (Signed)
[name] is resting comfortably. She did not sleep well the night before-pain much worse this AM and she has just had oxycodone. Metastatic cancer-plans for radiation. I spoke with her daughter-given her decline related to uncontrolled pain we should keep her inpatient through the weekend-if she continues to require high level of pain medication and is too sedated we may be need talk again about hospice. Short term rehab plans until genotype results come back and through radiation with a plan to return home is current direction. Prognosis is still very poor but reasonable to try to maximize her independence based on her goals.Will follow over the weekend.  Lane Hacker, DO Palliative Medicine

## 2016-07-21 NOTE — Progress Notes (Signed)
[name] is an incredibly charming woman. I really enjoyed talking with her and her daughter. She seems to be much more comfortable. She really is not complaining much in the way of pain. She is on tramadol and oxycodone.  Her appetite might be a little bit better.  She is awaiting placement.  She has had no problems with cough or shortness of breath. She slept pretty well last night. She's had no nausea or vomiting.  On her physical exam, her vital signs showed temperature of 97.8. Pulse 65. Blood pressure 138/66. Head and neck exam shows no ocular or oral lesions. She has no palpable cervical or supraclavicular lymph nodes. Lungs are clear. There may be some slight decrease over on the right base. Cardiac exam regular rate and rhythm with no murmurs. Abdomen is soft. There may be some slight tenderness down in the suprapubic area. No masses noted. There is no fluid wave. There is no palpable liver or spleen tip. Extremities shows some slight muscle/V in upper and lower extremities.  [name] has metastatic adenocarcinoma of the lung. We are awaiting the genetic testing to see if she might qualify for a targeted therapy.  She does look pretty good today. It seems as if the combination of Ultram and oxycodone is working well for her.  Her daughter was with [place]. It was nice talking with both of them.  As always, the staff on [address] are doing a fantastic job.  Brandi Haw, MD  [place] [phone]

## 2016-07-22 MED ORDER — POLYETHYLENE GLYCOL 3350 17 G PO PACK
17.0000 g | PACK | Freq: Two times a day (BID) | ORAL | Status: DC
  Administered 2016-07-22 – 2016-07-24 (×4): 17 g via ORAL
  Filled 2016-07-22 (×4): qty 1

## 2016-07-22 MED ORDER — BISACODYL 10 MG RE SUPP: 10.0000 mg | Freq: Every day | RECTAL | Status: DC | PRN

## 2016-07-22 NOTE — Progress Notes (Signed)
Daily Progress Note   Patient Name: [name]       Date: 07/22/2016 DOB: Apr 19, 1941  Age: 76 y.o. MRN#: [id number] Attending Physician: [name], MD Primary Care Physician: No PCP Per Patient Admit Date: 07/13/2016  Reason for Consultation/Follow-up: Disposition, Establishing goals of care, Non pain symptom management, Pain control and Psychosocial/spiritual support  Patient is awake alert sitting up in chair, she is having a good day so far.  See below.   Length of Stay: 9  Current Medications: Scheduled Meds:  . acetaminophen  650 mg Oral QID  . aspirin EC  81 mg Oral Daily  . atorvastatin  80 mg Oral Daily  . calcitonin (salmon)  1 spray Alternating Nares Daily  . clopidogrel  75 mg Oral Daily  . dexamethasone  4 mg Oral Q12H  . enoxaparin (LOVENOX) injection  40 mg Subcutaneous Q24H  . feeding supplement (ENSURE ENLIVE)  237 mL Oral BID BM  . magnesium oxide  400 mg Oral BID  . metoCLOPramide  5 mg Oral BID  . metoprolol  50 mg Oral BID  . multivitamin with minerals  1 tablet Oral Daily  . pantoprazole  40 mg Oral Daily  . polyethylene glycol  17 g Oral BID  . senna-docusate  1 tablet Oral BID    Continuous Infusions:   PRN Meds: bisacodyl, bisacodyl, nitroGLYCERIN, ondansetron (ZOFRAN) IV, oxyCODONE, pramoxine, prochlorperazine, traMADol, zolpidem  Physical Exam         Awake alert  Sitting in chair S1 S2 Clear Abdomen: distended in lower quadrants, firm,  No edema Awake alert Non focal  Vital Signs: BP (!) 170/79 (BP Location: Right Arm)   Pulse (!) 59   Temp 97.9 F (36.6 C) (Oral)   Resp 14   Ht '5\' 5"'$  (1.651 m)   Wt 68 kg (150 lb)   SpO2 95%   BMI 24.96 kg/m  SpO2: SpO2: 95 % O2 Device: O2 Device: Not Delivered O2 Flow Rate:     Intake/output summary:   Intake/Output Summary (Last 24 hours) at 07/22/16 1337 Last data filed at 07/22/16 1234  Gross per 24 hour  Intake             1740 ml  Output  0 ml  Net             1740 ml   LBM: Last BM Date: 07/21/16 Baseline Weight: Weight: 68 kg (150 lb) Most recent weight: Weight: 68 kg (150 lb)   Patient Active Problem List   Diagnosis Date Noted  . DNR (do not resuscitate) 07/16/2016  . Compression fracture of L5 lumbar vertebra ([place]) 07/16/2016  . Hypercalcemia 07/13/2016  . Acute back pain 07/13/2016  . Need for prophylactic vaccination and inoculation against influenza 04/17/2013  . Chronic total occlusion of artery of the extremities ([place]) 09/21/2011  . Osteoporosis 08/17/2011  . History of B-cell lymphoma   . PAD (peripheral artery disease) ([place]) 11/10/2010  . DVT 02/23/2010  . PVD 06/01/2009  . Essential hypertension, benign 04/20/2009  . CAD, NATIVE VESSEL 04/20/2009  . DYSLIPIDEMIA 04/12/2009  . AMI, INFERIOR WALL 04/12/2009  . DIASTOLIC DYSFUNCTION 09/60/4540  . ATHEROSCLEROSIS, NATIVE ARTERY, EXTREMITY, WITH CLAUDICATION 04/05/2009  . CAROTID BRUIT 04/05/2009    Palliative Care Assessment & Plan   Patient Profile: 76 yo newly diagnosed adenocarcinoma, presumably lung, genotype pending who on presentation had severe intractable back pain and was found to have widely metastatic disease including a large presacral mass and an L5 compression from tumor infiltration.  Assessment:  pain management: pain under adequate control Appetite is good, patient states she ate almost 100% of her lunch Patient sitting in chair, states she even walked outside her room today Constipation: patient had 2 small hard BMs yesterday, she complains of firmness in her lower abdomen, complains of feeling bloated, she is passing gas.   Recommendations/Plan:  continue current pain regimen Augment bowel regimen and follow along.  Encourage OOB  Goals  of Care and Additional Recommendations:  Limitations on Scope of Treatment: Full Scope Treatment  Code Status:    Code Status Orders        Start     Ordered   07/15/16 0805  Do not attempt resuscitation (DNR)  Continuous    Question Answer Comment  In the event of cardiac or respiratory ARREST Do not call a "code blue"   In the event of cardiac or respiratory ARREST Do not perform Intubation, CPR, defibrillation or ACLS   In the event of cardiac or respiratory ARREST Use medication by any route, position, wound care, and other measures to relive pain and suffering. May use oxygen, suction and manual treatment of airway obstruction as needed for comfort.      07/15/16 0804    Code Status History    Date Active Date Inactive Code Status Order ID Comments User Context   07/13/2016  3:44 PM 07/15/2016  8:04 AM Full Code [id number]  [name], MD Inpatient       Prognosis:   < 3 months  Discharge Planning:  Short term rehab CIR or SNF with plan to transition into hospice care  Care plan was discussed with patient,    Thank you for allowing the Palliative Medicine Team to assist in the care of this patient.  Time: 25 minutes     Greater than 50%  of this time was spent counseling and coordinating care related to the above assessment and plan.  Loistine Chance, MD [phone]  Please contact Palliative Medicine Team phone at [phone] for questions and concerns.

## 2016-07-22 NOTE — Progress Notes (Signed)
[name] is doing fairly well this morning. She had a good day yesterday. She slept pretty well last night.  She is doing pretty well with pain control. She might be having a little bit more pain this morning. However, the OxyContin and Ultram seemed to help quite a bit.  She has had no cough. She's had no nausea or vomiting. She's had no constipation or diarrhea.  On her physical exam, her vital signs show temperature 97.9. Pulse 59. Blood pressure 170/79. An exam shows no ocular or oral lesions. She has no palpable cervical or supraclavicular lymph nodes. Lungs show slight decreased breath sounds in the bases. She may have an occasional wheeze. Cardiac exam regular rate and rhythm with no murmurs, rubs or bruits. Abdomen is soft. She has good bowel sounds. There is no palpable hepatomegaly. Extremities shows no clubbing, cyanosis or edema. Neurological exam shows no focal neurological deficits.  [name] is a 76 year old white female with metastatic adenocarcinoma the lung. She is awaiting rehabilitation placement. Hopefully she will be going in the next couple days.  She seems fairly comfortable. There does not appear to be any new issues that we need to address area and she is very pleased with the wonderful care that she is getting from everybody up on [address].  Lattie Haw, MD  Psalm 29:11

## 2016-07-23 ENCOUNTER — Encounter: Payer: Self-pay | Admitting: Radiation Oncology

## 2016-07-23 ENCOUNTER — Ambulatory Visit
Admit: 2016-07-23 | Discharge: 2016-07-23 | Disposition: A | Discharge status: Home / Self Care | Payer: Medicare Other | Attending: Radiation Oncology | Admitting: Radiation Oncology

## 2016-07-23 NOTE — Progress Notes (Signed)
I received a call from Dr. Hilma Favors and we discussed the patient's case. The patient has reservations regarding radiotherapy, specifically with brain radiotherapy. We discussed that medical oncology feels that there is a low probability that she would be a candidate for targeted treatment, and that the patient would decline any systemic therapy per their discussion. I called and spoke with the patient and she would like to only do the radiotherapy which will alleviate her most concerning symptom which is her pain. After discussing with Dr. Lisbeth Renshaw, if she is not going to move forward with brain treatment, we would condense her treatment to 5 fractions rather than the original 10 we were anticipating. We will make this change, and she will be able to complete treatment this Friday. I called to let the patient know this, and left a message for her daughter so we can discuss further.     Carola Rhine, PAC

## 2016-07-23 NOTE — Progress Notes (Signed)
Physical Therapy Treatment Patient Details Name: [name] MRN: [id number] DOB: 1940-12-29 Today's Date: 07/23/2016    History of Present Illness 76 y.o. female  with past medical history of lymphoma admitted on 07/13/2016 with severe low back pain, hypercalcemia and fall at home with reported 3 weeks of pain and functional status decline. was admitted by Dr. Alvy Bimler for symptom management and for recurrent malignancy evaluation. CT of abdomen and chest showed widely metastatic cancer including what may be a new primary lung mass.    PT Comments    Pt ambulated 130' with RW without LOB, back pain 6/10 with walking, pain meds requested. Pt stated she prefers to DC home if she tolerates radiation treatments. Her granddaughter is home from college for the next 2 weeks and can stay with patient. Per SW notes, pt's daughter prefers SNF for DC. Palliative note stated they'd clarify DC plan with pt's daughter. From PT standpoint, at present pt is mobilizing well enough to DC home, however noted that pt is expected to decline and level of care needed will likely increase.    Follow Up Recommendations  SNF;vs Home health PT;Supervision for mobility/OOB     Equipment Recommendations  None recommended by PT    Recommendations for Other Services OT consult     Precautions / Restrictions Precautions Precautions: Fall Restrictions Weight Bearing Restrictions: No    Mobility  Bed Mobility               General bed mobility comments: Pt up in chair  Transfers Overall transfer level: Needs assistance Equipment used: Rolling walker (2 wheeled) Transfers: Sit to/from Stand Sit to Stand: Min guard         General transfer comment: cues for hand placement  Ambulation/Gait Ambulation/Gait assistance: Supervision Ambulation Distance (Feet): 130 Feet Assistive device: Rolling walker (2 wheeled) Gait Pattern/deviations: Step-through pattern;Decreased stride length Gait  velocity: decr   General Gait Details: steady with RW, no LOB.   Pt reporting pain beginning to elevate after walking 65', pain meds requested.    Stairs            Wheelchair Mobility    Modified Rankin (Stroke Patients Only)       Balance Overall balance assessment: Needs assistance;History of Falls Sitting-balance support: Feet supported;No upper extremity supported Sitting balance-Leahy Scale: Good     Standing balance support: No upper extremity supported Standing balance-Leahy Scale: Fair Standing balance comment: steady with BUE support                    Cognition Arousal/Alertness: Awake/alert Behavior During Therapy: WFL for tasks assessed/performed Overall Cognitive Status: Within Functional Limits for tasks assessed                      Exercises      General Comments        Pertinent Vitals/Pain Pain Score: 6  Pain Location: back Pain Descriptors / Indicators: Aching Pain Intervention(s): Limited activity within patient's tolerance;Monitored during session;Patient requesting pain meds-RN notified;Heat applied    Home Living                      Prior Function            PT Goals (current goals can now be found in the care plan section) Acute Rehab PT Goals Patient Stated Goal: pt stated she wants to DC home if she tolerates radiation, her grandaughter will be there for  the next 2 weeks PT Goal Formulation: With patient Time For Goal Achievement: 07/30/16 Potential to Achieve Goals: Good Progress towards PT goals: Progressing toward goals    Frequency    Min 3X/week      PT Plan Other (comment) (palliative care note stated they will clarify DC plan with pt's daughter, pt stated she wants to DC home if she tolerates radiation well)    Co-evaluation             End of Session Equipment Utilized During Treatment: Gait belt Activity Tolerance: Patient limited by pain;Patient limited by fatigue Patient  left: in chair;with call bell/phone within reach Nurse Communication: Mobility status PT Visit Diagnosis: Unsteadiness on feet (R26.81);Muscle weakness (generalized) (M62.81);History of falling (Z91.81);Pain Pain - Right/Left: Right Pain - part of body: Hip     Time: 2182-8833 PT Time Calculation (min) (ACUTE ONLY): 14 min  Charges:  $Gait Training: 8-22 mins                    G Codes:       Blondell Reveal Kistler 07/23/2016, 12:00 PM [phone]

## 2016-07-23 NOTE — Progress Notes (Signed)
CSW met wit pt / daughter this am to assist with d/c planning. [name] is able to accept pt once stable for d/c. This is pt / daughter's SNF choice. NSG will update Dr. Alvy Bimler and request DNR be signed. CSW continue to follow to assist with d/c planning.  [name] LCSW [phone]

## 2016-07-23 NOTE — Progress Notes (Signed)
[name]   DOB:[birth year]   EX#:[id number]    Subjective: Her weekend was good.  Her appetite is good.  She is mobilizing better.  Pain control is better.  She had small bowel movement yesterday.  No nausea.  No new neurological deficits  Objective:  Vitals:   07/22/16 2251 07/23/16 0610  BP: 135/74 (!) 166/67  Pulse: (!) 58 (!) 58  Resp: 16 14  Temp: 98.5 F (36.9 C) 98.1 F (36.7 C)     Intake/Output Summary (Last 24 hours) at 07/23/16 0102 Last data filed at 07/22/16 1729  Gross per 24 hour  Intake             1260 ml  Output                0 ml  Net             1260 ml    GENERAL:alert, no distress and comfortable SKIN: skin color, texture, turgor are normal, no rashes or significant lesions EYES: normal, Conjunctiva are pink and non-injected, sclera clear OROPHARYNX:no exudate, no erythema and lips, buccal mucosa, and tongue normal  NECK: supple, thyroid normal size, non-tender, without nodularity LYMPH:  no palpable lymphadenopathy in the cervical, axillary or inguinal LUNGS: clear to auscultation and percussion with normal breathing effort HEART: regular rate & rhythm and no murmurs and no lower extremity edema ABDOMEN:abdomen soft, non-tender and normal bowel sounds Musculoskeletal:no cyanosis of digits and no clubbing  NEURO: alert & oriented x 3 with fluent speech, no focal motor/sensory deficits   Labs:  Lab Results  Component Value Date   WBC 12.0 (H) 07/15/2016   HGB 10.1 (L) 07/15/2016   HCT 31.9 (L) 07/15/2016   MCV 92.2 07/15/2016   PLT 232 07/15/2016   NEUTROABS 8.0 (H) 07/15/2016    Lab Results  Component Value Date   NA 142 07/19/2016   K 4.5 07/19/2016   CL 106 07/19/2016   CO2 29 07/19/2016    Assessment & Plan:   History of lymphoma 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 I have requested pathologist to add Foundation One testing; results will  be available for 2-3 weeks I have consulted radiation oncologist for palliative radiation therapy to bone and brain, hopefully will start today  Cancer pain Mild vasogenic edema on brain MRI Her falls could be related to recent fall and cancer associated pain I have stopped Dilaudid per daughter request She may benefit from palliative radiation treatment Appreciate palliative care assistance on pain management She is getting scheduled Tylenol and dexamethasone. I have added tramadol and oxycodone as needed. Her daughter felt that we should avoid narcotic due to patient's poor tolerance  Insomnia Likely due to dexamethasone. I have reduced dexamethasone to 4 mg twice a day This would hopefully reduce problem with insomnia  Hemorrhoidal irritation Due to chronic constipation I have prescribed sitz baths and Proctofoam  Hypercalcemia ofmalignancy She is on calcitonin and steroids This has resolved Recheck labs tomorrow  Leukocytosis Cough, resolved I do not believe she have pneumonia. Likely, she have COPD exacerbation and related to compression of her airway from cancer She feels well on some nebulizer treatment and cough suppressant as needed  Severe constipation, resolving I encouraged the patient to take laxatives as prescribed. She had received Relistor, tap water enema and dulcolax  CODE STATUS DNR  History of coronary artery disease status post myocardial infarction Resume Plavix Resumed  Amlodipine and continue metoprolol  Remote history of DVT We will start her on Lovenox for DVT prophylaxis, especially due to her poor mobility  Severe weakness and physical deconditioning She has been debilitated for over 3 weeks I will consult physical therapy; I continue to encourage her to mobilize as tolerated She has signs of antalgic gait.  My sense that she will not benefit from home physical therapy. She needs to be in a skilled nursing facility to assist in  mobility and to get intensive rehabilitation with adequate pain control. With significant heavy disease burden, evidence of multiple cerebral metastases and involvement nerve root at the lumbosacral region, her risk of fall is very very high.  Inpatient rehab team has declined her admission. Will need to get her to another SNF  Discharge planning Goals of CARE discussion We will consult palliative care to assist in goals of care discussion and symptom management The patient understood that if this is metastatic lung cancer, treatment would be strictly palliative in nature Patient and daughter agreed to proceed with palliative radiation treatment We will wait for Foundation One testing; if no targeted therapy is available, she will unlikely proceed with standard chemotherapy. Prognosis is discussed; without systemic chemo, survival is likely < 3 months We discussed referral to social worker for SNF placement while undergoing XRT Ready for discharge once placement is available  [name], MD 07/23/2016  7:37 AM

## 2016-07-23 NOTE — Care Management Important Message (Signed)
Important Message  Patient Details  Name: [name] MRN: [id number] Date of Birth: [birth year]   Medicare Important Message Given:  Yes    Kerin Salen 07/23/2016, 9:53 AMImportant Message  Patient Details  Name: [name] MRN: [id number] Date of Birth: [birth year]   Medicare Important Message Given:  Yes    Kerin Salen 07/23/2016, 9:53 AM

## 2016-07-23 NOTE — Progress Notes (Signed)
Ms. Mulnix reports taking more pain medication this morning, but for the most part she believes that she is getting better in terms of her mobility.  Plans today for radiation and patient tells me they are planning on brain radiation- she is not symptomatic from her small brain lesions, and her back pain is reasonably controlled with opioids dose ATC and steroids.   I talked to [place] alone today- she expressed that she has reservations about all of this including radiation- her goals are to go home, and live out her time as comfortable as possible and with good pain management-she is concerned about radiation making things worse for her.  The major barriers to home is that her daughter works 8  Hours during the day, but her grand-daughter is visiting for the next two weeks and is available to help. She lives with her daughter and has good support- this is mostly a safety and mobility at home issue during periods of the day when she does not have supervision at home.  I plan on talking with Cecille Rubin today - while we are waiting on tumor markers to return she still would be a good candidate for hospice and will likely need hospice care sooner rather than later regardless of the treatment pathway- if treatment becomes a reasonable option in line with her goals then she can transition off hospice (low probability of this).  Plans for today: 1. Continue to stay ahead of pain by taking medications scheduled 2. Continue to maximize mobility as much as tolerated 3. I am going to reach out to med onc and rad onc to make sure everyone is aware of the goals of care in her situation. ?if focused XRT to her paraspinal/presacral tumor for only a few tx would be helpful vs. Brain+spinal tumor XRT-?side effects will be worse than the growth of the cancer in her her brain since she is asymptomatic and this has not been shown to lengthen life. She also has a possible obstructing lung tumor. 4. Ms. Ruppert tells me she is  much more interested in going home and living out her time the best she can with her pain controlled. She knows her time is short-there may be conflicting goals between her daughter and her and I am going to see if I can help with these. 5. Consider Zometa - will help with pain and control hypercalcemia  In terms of her disposition-she is supposed to get XRT this afternoon and tomorrow morning-I think the best plan is to get her through these and then see how she is tolerating this and then see if our goals are still making rehab stay reasonable- my concern is high for sub-optimal post-acute care in a patient who is very fragile and has pain and needs careful monitoring for side effects from tx-she is a readmission risk unless we wait a few more days and make sure everything is stable for discharge which at that point we may be able to utilize hospice services.  Lane Hacker, DO Palliative Medicine [phone]

## 2016-07-24 ENCOUNTER — Ambulatory Visit
Admit: 2016-07-24 | Discharge: 2016-07-24 | Disposition: A | Discharge status: Home / Self Care | Payer: Medicare Other | Attending: Radiation Oncology | Admitting: Radiation Oncology

## 2016-07-24 ENCOUNTER — Telehealth: Payer: Self-pay

## 2016-07-24 LAB — COMPREHENSIVE METABOLIC PANEL
ALK PHOS: 141 U/L — AB (ref 38–126)
ALT: 35 U/L (ref 14–54)
AST: 36 U/L (ref 15–41)
Albumin: 2.9 g/dL — ABNORMAL LOW (ref 3.5–5.0)
Anion gap: 10 (ref 5–15)
BILIRUBIN TOTAL: 0.4 mg/dL (ref 0.3–1.2)
BUN: 32 mg/dL — AB (ref 6–20)
CALCIUM: 8.9 mg/dL (ref 8.9–10.3)
CHLORIDE: 100 mmol/L — AB (ref 101–111)
CO2: 28 mmol/L (ref 22–32)
CREATININE: 0.95 mg/dL (ref 0.44–1.00)
GFR, EST NON AFRICAN AMERICAN: 57 mL/min — AB (ref >60)
Glucose, Bld: 191 mg/dL — ABNORMAL HIGH (ref 65–99)
Potassium: 5.2 mmol/L — ABNORMAL HIGH (ref 3.5–5.1)
Sodium: 138 mmol/L (ref 135–145)
Total Protein: 5.9 g/dL — ABNORMAL LOW (ref 6.5–8.1)

## 2016-07-24 MED ORDER — ZOLPIDEM TARTRATE 5 MG PO TABS: 5.0000 mg | ORAL_TABLET | Freq: Every evening | ORAL | 0 refills | Status: DC | PRN

## 2016-07-24 MED ORDER — TRAMADOL HCL 50 MG PO TABS: 50.0000 mg | ORAL_TABLET | Freq: Four times a day (QID) | ORAL | 0 refills | Status: AC | PRN

## 2016-07-24 MED ORDER — OXYCODONE HCL 5 MG PO TABS: 5.0000 mg | ORAL_TABLET | ORAL | 0 refills | Status: DC | PRN

## 2016-07-24 MED ORDER — METOCLOPRAMIDE HCL 5 MG PO TABS: 5.0000 mg | ORAL_TABLET | Freq: Two times a day (BID) | ORAL | 1 refills | Status: AC

## 2016-07-24 MED ORDER — BISACODYL 5 MG PO TBEC: 5.0000 mg | DELAYED_RELEASE_TABLET | Freq: Every day | ORAL | 0 refills | Status: AC | PRN

## 2016-07-24 MED ORDER — POLYETHYLENE GLYCOL 3350 17 G PO PACK: 17.0000 g | PACK | Freq: Two times a day (BID) | ORAL | 9 refills | Status: AC

## 2016-07-24 MED ORDER — PANTOPRAZOLE SODIUM 40 MG PO TBEC: 40.0000 mg | DELAYED_RELEASE_TABLET | Freq: Every day | ORAL | 6 refills | Status: AC

## 2016-07-24 MED ORDER — SENNOSIDES-DOCUSATE SODIUM 8.6-50 MG PO TABS: 1.0000 | ORAL_TABLET | Freq: Two times a day (BID) | ORAL | 6 refills | Status: AC

## 2016-07-24 MED ORDER — DEXAMETHASONE 4 MG PO TABS: 4.0000 mg | ORAL_TABLET | Freq: Two times a day (BID) | ORAL | 0 refills | Status: AC

## 2016-07-24 NOTE — Discharge Summary (Signed)
Physician Discharge Summary  Patient ID: [name] MRN: [id number] DOB/AGE: [birth year] 76 y.o.  Admit date: 07/13/2016 Discharge date: 07/24/2016  Primary Care Physician:  No PCP Per Patient   Discharge Diagnoses:    Present on Admission: . Osteoporosis . Hypercalcemia . Acute back pain . DNR (do not resuscitate) . Compression fracture of L5 lumbar vertebra [facility])   Discharge Medications:  Allergies as of 07/24/2016   No Known Allergies     Medication List    STOP taking these medications   CALCIUM 600+D 600-400 MG-UNIT tablet Generic drug:  Calcium Carbonate-Vitamin D   magnesium oxide 400 (241.3 Mg) MG tablet Commonly known as:  MAG-OX     TAKE these medications   acetaminophen 500 MG tablet Commonly known as:  TYLENOL Take 1,000 mg by mouth every 6 (six) hours as needed for mild pain, moderate pain or headache. Takes for pain   amLODipine 10 MG tablet Commonly known as:  NORVASC Take 1 tablet (10 mg total) by mouth daily.   aspirin EC 81 MG tablet Take 81 mg by mouth daily.   atorvastatin 80 MG tablet Commonly known as:  LIPITOR Take 80 mg by mouth every evening.   bisacodyl 5 MG EC tablet Commonly known as:  DULCOLAX Take 1 tablet (5 mg total) by mouth daily as needed for moderate constipation.   clopidogrel 75 MG tablet Commonly known as:  PLAVIX Take 1 tablet (75 mg total) by mouth daily.   dexamethasone 4 MG tablet Commonly known as:  DECADRON Take 1 tablet (4 mg total) by mouth 2 (two) times daily.   LORazepam 0.5 MG tablet Commonly known as:  ATIVAN 1/2- 1 tab po 30 minutes prior to radiation or MRI   metoCLOPramide 5 MG tablet Commonly known as:  REGLAN Take 1 tablet (5 mg total) by mouth 2 (two) times daily.   metoprolol 50 MG tablet Commonly known as:  LOPRESSOR Take 1 tablet (50 mg total) by mouth 2 (two) times daily.   multivitamin with minerals tablet Take 1 tablet by mouth daily.   nitroGLYCERIN 0.4 MG SL  tablet Commonly known as:  NITROSTAT Place 0.4 mg under the tongue every 5 (five) minutes as needed for chest pain.   oxyCODONE 5 MG immediate release tablet Commonly known as:  Oxy IR/ROXICODONE Take 1 tablet (5 mg total) by mouth every 3 (three) hours as needed for moderate pain.   pantoprazole 40 MG tablet Commonly known as:  PROTONIX Take 1 tablet (40 mg total) by mouth daily.   polyethylene glycol packet Commonly known as:  MIRALAX / GLYCOLAX Take 17 g by mouth 2 (two) times daily.   senna-docusate 8.6-50 MG tablet Commonly known as:  Senokot-S Take 1 tablet by mouth 2 (two) times daily.   traMADol 50 MG tablet Commonly known as:  ULTRAM Take 1 tablet (50 mg total) by mouth every 6 (six) hours as needed for moderate pain.   zolpidem 5 MG tablet Commonly known as:  AMBIEN Take 1 tablet (5 mg total) by mouth at bedtime as needed for sleep.       Disposition and Follow-up:   Significant Diagnostic Studies:  Ct Chest W Contrast  Result Date: 07/14/2016 CLINICAL DATA:  Non-Hodgkin's lymphoma. Severe uncontrolled back pain with hypercalcemia and elevated LDH levels. Evaluate for recurrence. EXAM: CT CHEST, ABDOMEN, AND PELVIS WITH CONTRAST TECHNIQUE: Multidetector CT imaging of the chest, abdomen and pelvis was performed following the standard protocol during bolus administration of intravenous contrast.  CONTRAST:  36m ISOVUE-300 IOPAMIDOL (ISOVUE-300) INJECTION 61%, 1034mISOVUE-300 IOPAMIDOL (ISOVUE-300) INJECTION 61% COMPARISON:  Prior CTs 09/12/2012 FINDINGS: CT CHEST FINDINGS Cardiovascular: There is diffuse atherosclerosis of the aorta, great vessels and coronary arteries. No acute vascular findings are seen. The heart size is normal. There is no pericardial effusion. Mediastinum/Nodes: There is new mediastinal and right hilar lymphadenopathy. Right paratracheal node measures 2.9 x 2.4 cm on image 25. There is a subcarinal node measuring 2.6 x 1.6 cm on image 31 and a right  hilar node measuring 2.0 x 1.3 cm on image 28. There is a large paramediastinal mass adjacent to the ascending aorta which is probably a pulmonary lesion, described below. The thyroid gland, trachea and esophagus demonstrate no significant findings. Lungs/Pleura: There is no pleural effusion. There is dependent bibasilar atelectasis. As above, there is a large right upper lobe mass abutting the anterior mediastinum. This measures 5.9 x 4.2 cm on image 70. Additional pulmonary nodules are present, including a 1.2 cm right lower lobe nodule on image 106. Some of these are ground-glass, including ill-defined components in the left upper lobe which have progressed compared with the prior study. Mild underlying emphysema noted. Musculoskeletal/Chest wall: Mild thoracic spine compression deformities are stable. There are no worrisome osseous findings. There is a new soft tissue nodule in the subcutaneous fat of the right upper back, overlying the right scapula. This measures 11 mm on image 16. CT ABDOMEN AND PELVIS FINDINGS Hepatobiliary: There is a new dominant mass in the lateral segment of left hepatic lobe which measures 5.6 x 5.5 cm on image 61. There are additional smaller lesions within the right lobe measuring 12 mm on image 54. There is a smaller lesion more inferiorly on image 63. There is chronic extrahepatic biliary dilatation status post cholecystectomy which appears similar to the prior study. Pancreas: The pancreas is atrophied. There is no evidence of pancreatic mass, surrounding inflammation or ductal dilatation. Spleen: Normal in size without focal abnormality. Adrenals/Urinary Tract: There is increased nodularity of the left adrenal gland which demonstrates a 1.9 x 1.4 cm nodule on image 62. The right adrenal gland appears normal. There is a new intermediate density nodule in the posterior aspect of the right kidney measuring 1.4 cm on image 22 of series 7. This has an indeterminate soft tissue  attenuation. The kidneys otherwise appear stable with small cysts bilaterally. There is some cortical thinning and perinephric soft tissue stranding on the left. No evidence of hydronephrosis or ureteral calculus. The bladder appears unremarkable. Stomach/Bowel: No evidence of bowel wall thickening, distention or surrounding inflammatory change. The stomach is incompletely distended and suboptimally evaluated. Vascular/Lymphatic: There are no enlarged abdominal or pelvic lymph nodes. There is diffuse aortic and branch vessel atherosclerosis. No evidence of large vessel occlusion. Reproductive: The uterus is atrophied.  No evidence of adnexal mass. Other: There is a new left upper quadrant omental mass measuring 2.0 x 2.1 cm on image 71. No ascites or generalized peritoneal nodularity. Musculoskeletal: Interval development of a biconcave compression deformity at L5. No underlying lytic lesion is seen within the vertebral body, although there is a lytic lesion involving the right L4 transverse process, most obvious on the coronal images. Previous left proximal femoral ORIF noted. IMPRESSION: 1. There are multiple new lesions consistent with metastatic disease. There is a dominant right upper lobe mass with additional bilateral pulmonary nodules, mediastinal and right hilar adenopathy, several hepatic lesions, a new left adrenal nodule, an omental peritoneal implant and at least 1  lytic osseous lesion. Findings could be secondary to recurrent multifocal lymphoma, although pattern is atypical for lymphoma, and primary lung cancer should be considered given the size of the right upper lobe lesion. 2. Additional subcutaneous nodule in the right upper back could reflect a metastasis. 3. Chronic lung disease with associated atelectasis and scattered ground-glass densities. 4. Indeterminate intermediate density lesion in the posterior interpolar region of the right kidney. 5. Age-indeterminate L5 compression fracture.  Electronically Signed   By: Richardean Sale M.D.   On: 07/14/2016 11:59   [name] TM Contrast  Result Date: 07/18/2016 CLINICAL DATA:  76 y/o F; history of lymphoma and metastatic lung cancer. EXAM: MRI HEAD WITHOUT AND WITH CONTRAST TECHNIQUE: Multiplanar, multiecho pulse sequences of the brain and surrounding structures were obtained without and with intravenous contrast. CONTRAST:  75m MULTIHANCE GADOBENATE DIMEGLUMINE 529 MG/ML IV SOLN COMPARISON:  07/17/2016 MRI of the brain. FINDINGS: Brain: No diffusion signal abnormality. No abnormal susceptibility hypointensity to indicate intracranial hemorrhage. Confluent T2 FLAIR hyperintense signal abnormality throughout subcortical and periventricular white matter is compatible with advanced chronic microvascular ischemic changes. Moderate brain parenchymal volume loss. Greater than 20 enhancing metastasis are identified scattered throughout the supratentorial brain and there additional metastasis in the left cerebellar hemisphere on the right anterior pons. The largest metastasis are present within the right anterolateral frontal lobe measuring 7 mm (series 10, image 91) and within the left lateral temporal lobe measuring 9 mm (series 10, image 83). There is minimal associated vasogenic edema with the metastasis. 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 Skull and upper cervical spine: Enhancing lesion in the right parietal bone is likely an osseous metastasis. Sinuses/Orbits: Negative. Other: None. IMPRESSION: 1. Greater than 20 supratentorial, 1 left cerebellar hemisphere, and 1 pons metastasis much better appreciated when compared with prior MRI of the brain. The largest lesion measures 9 mm in the left lateral temporal lobe. 2. No evidence of acute infarct, intracranial hemorrhage, or significant mass effect. 3. Stable advanced chronic microvascular  ischemic changes and moderate brain parenchymal volume loss. 4. Stable enhancing lesion in right parietal bone likely metastasis. Electronically Signed   By: LKristine GarbeM.D.   On: 07/18/2016 19:32   Mr BJeri CosWHDContrast  Result Date: 07/17/2016 CLINICAL DATA:  History of lymphoma and metastatic lung cancer. Intermittent confusion, assess for intracranial metastasis. History of hypertension. EXAM: MRI HEAD WITHOUT AND WITH CONTRAST TECHNIQUE: Multiplanar, multiecho pulse sequences of the brain and surrounding structures were obtained without and with intravenous contrast. CONTRAST:  14 cc MultiHance COMPARISON:  None. FINDINGS: Post gadolinium sequences are moderately motion degraded. INTRACRANIAL CONTENTS: No reduced diffusion to suggest acute ischemia or intracranial hypercellular tumor. No susceptibility artifact to suggest hemorrhage. The ventricles and sulci are normal for patient's age. Confluent supratentorial and to lesser extent pontine white matter FLAIR T2 hyperintensities. Old bilateral putaminal lacunar infarcts. Ring-enhancing RIGHT frontal juxta cortical 8 mm lesion. Solidly enhancing subcentimeter RIGHT frontal/insular enhancing nodule. 3 mm RIGHT frontal lobe nodule. 4 mm LEFT frontal lobe nodule. 7 mm ring-enhancing LEFT posterior temporal lobe lesion, 3 mm homogeneously enhancing LEFT parietal metastasis. 3 mm LEFT cerebellar nodule. No abnormal extra-axial fluid collections. No extra-axial masses. VASCULAR: T2 bright signal LEFT vertebral artery most compatible with occlusion. 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 Dolicoectatic intracranial vessels compatible chronic hypertension. SKULL AND UPPER CERVICAL SPINE: No abnormal sellar expansion. Patchy reduced  diffusion RIGHT parietal calvarium consistent with metastatic disease, associated expansile low T1 signal. No suspicious calvarial bone marrow signal. Craniocervical junction maintained. SINUSES/ORBITS: The mastoid air-cells and  included paranasal sinuses are well-aerated.The included ocular globes and orbital contents are non-suspicious. OTHER: None. IMPRESSION: Limited assessment due to moderately motion degraded post gadolinium sequences. At least 6 supra and single infratentorial subcentimeter metastasis. RIGHT parietal calvarial metastasis. 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 Recommend CTA HEAD and neck for further characterization. Moderate to severe white matter changes compatible chronic small vessel ischemic disease and a component of vasogenic edema. 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 Electronically Signed   By: Elon Alas M.D.   On: 07/17/2016 17:21   Mr Lumbar Spine W Wo Contrast  Result Date: 07/17/2016 CLINICAL DATA:  Low back pain. History of lymphoma and metastatic lung cancer. EXAM: MRI LUMBAR SPINE WITHOUT AND WITH CONTRAST TECHNIQUE: Multiplanar and multiecho pulse sequences of the lumbar spine were obtained without and with intravenous contrast. CONTRAST:  69m MULTIHANCE GADOBENATE DIMEGLUMINE 529 MG/ML IV SOLN COMPARISON:  CT abdomen and pelvis July 04, 2016 FINDINGS: SEGMENTATION: For the purposes of this report, the last well-formed intervertebral disc will be described as L5-S1. ALIGNMENT: Maintenance of the lumbar lordosis. No malalignment. VERTEBRAE: Low T1, bright STIR and enhancing lesions all lumbar levels. Moderate L5 pathologic fracture, approximate 50% height loss without retropulsed bony fragments. Expansile metastasis RIGHT L4 transverse process. Low signal metastasis T11 partially imaged. Multiple sacral metastasis. Intervertebral discs demonstrate normal morphology, mildly desiccation. Edema within the L5-S1 disc. No suspicious intradiscal enhancement. CONUS MEDULLARIS: Conus medullaris terminates at L1-2 and demonstrates normal morphology and signal characteristics. Cauda equina is normal. No suspicious cord or leptomeningeal enhancement. PARASPINAL AND  SOFT TISSUES: Abnormal low signal partially imaged LEFT presacral soft tissues concerning for tumor extension, which could affect the sciatic nerve. Moderate symmetric paraspinal muscle atrophy. Mildly ectatic infrarenal aorta. DISC LEVELS: L1-2: No disc bulge. Mild facet arthropathy and ligamentum flavum redundancy without canal stenosis or neural foraminal narrowing. L2-3: Small broad-based disc bulge. Mild facet arthropathy and ligamentum flavum redundancy. Very mild canal stenosis without neural foraminal narrowing. L3-4: Small broad-based disc bulge. Mild facet arthropathy and ligamentum flavum redundancy. Very mild canal stenosis. No neural foraminal narrowing. L4-5: 5 mm broad-based disc bulge. Moderate facet arthropathy and ligamentum flavum redundancy including 3 mm RIGHT facet synovial cyst within dorsal epidural space. Severe canal stenosis in trefoil configuration. Mild bilateral caudal neural foraminal narrowing. Subcentimeter LEFT facet synovial cyst extending into paraspinal soft tissues. L5-S1: Small broad-based disc bulge. Moderate facet arthropathy and ligamentum flavum redundancy without canal stenosis. Minimal neural foraminal narrowing. IMPRESSION: Diffuse osseous metastasis.  Moderate L5 pathologic fracture. Degenerative lumbar spine resulting in severe canal stenosis L4-5, very mild canal stenosis L2-3 and L3-4. Minimal to mild neural foraminal narrowing L4-5 and L5-S1. Low signal LEFT presacral soft tissues most consistent with tumoral extension which could affect the LEFT sciatic nerve. Electronically Signed   By: CElon AlasM.D.   On: 07/17/2016 16:57   Ct Abdomen Pelvis W Contrast  Result Date: 07/14/2016 CLINICAL DATA:  Non-Hodgkin's lymphoma. Severe uncontrolled back pain with hypercalcemia and elevated LDH levels. Evaluate for recurrence. EXAM: CT CHEST, ABDOMEN, AND PELVIS WITH CONTRAST TECHNIQUE: Multidetector CT imaging of the chest, abdomen and pelvis was performed  following the standard protocol during bolus administration of intravenous contrast. CONTRAST:  335mISOVUE-300 IOPAMIDOL (ISOVUE-300) INJECTION 61%, 10032mSOVUE-300 IOPAMIDOL (ISOVUE-300) INJECTION 61% COMPARISON:  Prior CTs 09/12/2012 FINDINGS: CT  CHEST FINDINGS Cardiovascular: There is diffuse atherosclerosis of the aorta, great vessels and coronary arteries. No acute vascular findings are seen. The heart size is normal. There is no pericardial effusion. Mediastinum/Nodes: There is new mediastinal and right hilar lymphadenopathy. Right paratracheal node measures 2.9 x 2.4 cm on image 25. There is a subcarinal node measuring 2.6 x 1.6 cm on image 31 and a right hilar node measuring 2.0 x 1.3 cm on image 28. There is a large paramediastinal mass adjacent to the ascending aorta which is probably a pulmonary lesion, described below. The thyroid gland, trachea and esophagus demonstrate no significant findings. Lungs/Pleura: There is no pleural effusion. There is dependent bibasilar atelectasis. As above, there is a large right upper lobe mass abutting the anterior mediastinum. This measures 5.9 x 4.2 cm on image 70. Additional pulmonary nodules are present, including a 1.2 cm right lower lobe nodule on image 106. Some of these are ground-glass, including ill-defined components in the left upper lobe which have progressed compared with the prior study. Mild underlying emphysema noted. Musculoskeletal/Chest wall: Mild thoracic spine compression deformities are stable. There are no worrisome osseous findings. There is a new soft tissue nodule in the subcutaneous fat of the right upper back, overlying the right scapula. This measures 11 mm on image 16. CT ABDOMEN AND PELVIS FINDINGS Hepatobiliary: There is a new dominant mass in the lateral segment of left hepatic lobe which measures 5.6 x 5.5 cm on image 61. There are additional smaller lesions within the right lobe measuring 12 mm on image 54. There is a smaller lesion  more inferiorly on image 63. There is chronic extrahepatic biliary dilatation status post cholecystectomy which appears similar to the prior study. Pancreas: The pancreas is atrophied. There is no evidence of pancreatic mass, surrounding inflammation or ductal dilatation. Spleen: Normal in size without focal abnormality. Adrenals/Urinary Tract: There is increased nodularity of the left adrenal gland which demonstrates a 1.9 x 1.4 cm nodule on image 62. The right adrenal gland appears normal. There is a new intermediate density nodule in the posterior aspect of the right kidney measuring 1.4 cm on image 22 of series 7. This has an indeterminate soft tissue attenuation. The kidneys otherwise appear stable with small cysts bilaterally. There is some cortical thinning and perinephric soft tissue stranding on the left. No evidence of hydronephrosis or ureteral calculus. The bladder appears unremarkable. Stomach/Bowel: No evidence of bowel wall thickening, distention or surrounding inflammatory change. The stomach is incompletely distended and suboptimally evaluated. Vascular/Lymphatic: There are no enlarged abdominal or pelvic lymph nodes. There is diffuse aortic and branch vessel atherosclerosis. No evidence of large vessel occlusion. Reproductive: The uterus is atrophied.  No evidence of adnexal mass. Other: There is a new left upper quadrant omental mass measuring 2.0 x 2.1 cm on image 71. No ascites or generalized peritoneal nodularity. Musculoskeletal: Interval development of a biconcave compression deformity at L5. No underlying lytic lesion is seen within the vertebral body, although there is a lytic lesion involving the right L4 transverse process, most obvious on the coronal images. Previous left proximal femoral ORIF noted. IMPRESSION: 1. There are multiple new lesions consistent with metastatic disease. There is a dominant right upper lobe mass with additional bilateral pulmonary nodules, mediastinal and right  hilar adenopathy, several hepatic lesions, a new left adrenal nodule, an omental peritoneal implant and at least 1 lytic osseous lesion. Findings could be secondary to recurrent multifocal lymphoma, although pattern is atypical for lymphoma, and primary lung cancer  should be considered given the size of the right upper lobe lesion. 2. Additional subcutaneous nodule in the right upper back could reflect a metastasis. 3. Chronic lung disease with associated atelectasis and scattered ground-glass densities. 4. Indeterminate intermediate density lesion in the posterior interpolar region of the right kidney. 5. Age-indeterminate L5 compression fracture. Electronically Signed   By: Richardean Sale M.D.   On: 07/14/2016 11:59   US Biopsy  Result Date: 07/16/2016 INDICATION: History of lymphoma, now with findings worrisome for a marked disease progression including development of a indeterminate nodule within the subcutaneous tissues about the right upper back. Please perform ultrasound-guided biopsy for tissue diagnostic purposes. EXAM: ULTRASOUND GUIDED LIVER LESION BIOPSY COMPARISON:  None. MEDICATIONS: None ANESTHESIA/SEDATION: None COMPLICATIONS: None immediate. PROCEDURE: Informed written consent was obtained from the patient after a discussion of the risks, benefits and alternatives to treatment. The patient understands and consents the procedure. A timeout was performed prior to the initiation of the procedure. Ultrasound scanning was performed of the right upper abdominal quadrant demonstrates an approximately 1.0 x 1.0 cm hypoechoic nodule within the subcutaneous tissues of the right lateral upper back compatible the nodule seen on preceding chest CT image 16, series 2. The procedure was planned. The skin overlying the right lateral upper back was prepped and draped in the usual sterile fashion. The overlying soft tissues were anesthetized with 1% lidocaine with epinephrine. A 17 gauge, 6.8 cm co-axial needle  was advanced into a peripheral aspect of the lesion. No fluid was able able to be aspirated from this hypoechoic nodule. As such, This was followed by 5 core biopsies with an 18 gauge core device under direct ultrasound guidance. The coaxial needle tract removed and superficial hemostasis was obtained with manual compression. Post procedural scanning was negative for definitive area of hemorrhage or additional complication. A dressing was placed. The patient tolerated the procedure well without immediate post procedural complication. IMPRESSION: Technically successful ultrasound guided core needle biopsy of indeterminate approximately 1 cm nodule within the subcutaneous tissues of the right upper back. Electronically Signed   By: Sandi Mariscal M.D.   On: 07/16/2016 16:31    Discharge Laboratory Values: Lab Results  Component Value Date   WBC 12.0 (H) 07/15/2016   HGB 10.1 (L) 07/15/2016   HCT 31.9 (L) 07/15/2016   MCV 92.2 07/15/2016   PLT 232 07/15/2016   Lab Results  Component Value Date   NA 138 07/24/2016   K 5.2 (H) 07/24/2016   CL 100 (L) 07/24/2016   CO2 28 07/24/2016    Brief H and P: For complete details please refer to admission H and P, but in brief, the patient was admitted due to malignant hypercalcemia.  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 I have requested pathologist to add Foundation One testing; results will be available for 2-3 weeks I have consulted radiation oncologist for palliative radiation treatment  Cancer pain Her fallscould be related to recent fall andcancer associated pain I have stopped Dilaudid per daughter request She may benefit from palliative radiation treatment Appreciate palliative care assistance on pain management She is getting scheduled Tylenol and dexamethasone. I have added tramadol and oxycodone as needed. Her  daughter felt that we should avoid narcotic due to patient's poor tolerance  Insomnia Likely due to dexamethasone. I havereduceddexamethasone to 4 mg twice a day This would hopefully reduce problem with insomnia  Hypercalcemia ofmalignancy She  is on calcitonin and steroids This has resolved  Leukocytosis Cough, resolved I do not believe she have pneumonia. Likely, she have COPD exacerbation and related to compression of her airway from cancer She feels well on some nebulizer treatment and cough suppressant as needed, resolved on steroids  Severe constipation, resolving I encouraged the patient to take laxatives as prescribed. She had received Relistor, tap water enema and dulcolax.  She will continue MiraLAX and Senokot daily  CODE STATUS DNR  History of coronary artery disease status post myocardial infarction Resume Plavix Resumed Amlodipine and continue metoprolol  Severe weakness and physical deconditioning She has been debilitated for over 1 month She will continue physical therapy at home Inpatient rehab team has declined her admission.  Discharge planning Goals of CARE discussion We will consult palliative care to assist in goals of care discussion and symptom management The patient understood that if this is metastatic lung cancer, treatment would be strictly palliative in nature Patient and daughter agreed to proceed with palliative radiation treatment We will wait for Foundation One testing; if no targeted therapy is available, she will unlikely proceed with standard chemotherapy. Prognosis is discussed; without systemic chemo, survival is likely < 3 months  Physical Exam at Discharge: BP 126/67 (BP Location: Right Arm)   Pulse 60   Temp 98.6 F (37 C) (Oral)   Resp 18   Ht '5\' 5"'$  (1.651 m)   Wt 150 lb (68 kg)   SpO2 95%   BMI 24.96 kg/m  GENERAL:alert, no distress and comfortable SKIN: skin color, texture, turgor are normal, no rashes or  significant lesions EYES: normal, Conjunctiva are pink and non-injected, sclera clear OROPHARYNX:no exudate, no erythema and lips, buccal mucosa, and tongue normal  NECK: supple, thyroid normal size, non-tender, without nodularity LYMPH:  no palpable lymphadenopathy in the cervical, axillary or inguinal LUNGS: clear to auscultation and percussion with normal breathing effort HEART: regular rate & rhythm and no murmurs and no lower extremity edema ABDOMEN:abdomen soft, non-tender and normal bowel sounds Musculoskeletal:no cyanosis of digits and no clubbing  NEURO: alert & oriented x 3 with fluent speech, no focal motor/sensory deficits  Hospital Course:  Active Problems:   History of B-cell lymphoma   Osteoporosis   Hypercalcemia   Acute back pain   DNR (do not resuscitate)   Compression fracture of L5 lumbar vertebra (HCC)   Diet:  Regular  Activity:  As tolerated  Condition at Discharge:   stable  Signed: Dr. [name] [phone] Spent 35 minutes on discharge 07/24/2016, 7:42 AM

## 2016-07-24 NOTE — Telephone Encounter (Signed)
Called to add PD-L-1 expression to tissue sample on 07/16/16, verbalized understanding.

## 2016-07-24 NOTE — Progress Notes (Signed)
Per CSW disposition plan has changed to home with home health services. This CM contacted daughter Cecille Rubin [phone]) and this was confirmed. Per Cecille Rubin, only HHPT is needed. Choice was offered for home health services and [facility] was chosen. [facility] rep contacted for referral. Daughter to pick pt up this afternoon. No other CM needs communicated. Marney Doctor RN,BSN,NCM [phone]

## 2016-07-24 NOTE — Progress Notes (Signed)
Pt discharged from unit via wheelchair with daughter in stable condition. Pt discharged to home. Discharge instructions and scripts given. Pt and daughter verbalized understanding. No immediate questions or concerns at this time.

## 2016-07-25 ENCOUNTER — Ambulatory Visit
Admission: RE | Admit: 2016-07-25 | Discharge: 2016-07-25 | Disposition: A | Discharge status: Home / Self Care | Payer: Medicare Other | Source: Ambulatory Visit | Attending: Radiation Oncology | Admitting: Radiation Oncology

## 2016-07-25 ENCOUNTER — Encounter: Payer: Self-pay | Admitting: Internal Medicine

## 2016-07-25 ENCOUNTER — Other Ambulatory Visit: Payer: Self-pay | Admitting: Internal Medicine

## 2016-07-25 DIAGNOSIS — C7951 Secondary malignant neoplasm of bone: Secondary | ICD-10-CM | POA: Diagnosis present

## 2016-07-25 DIAGNOSIS — C7931 Secondary malignant neoplasm of brain: Secondary | ICD-10-CM | POA: Diagnosis not present

## 2016-07-25 MED ORDER — ONDANSETRON HCL 8 MG PO TABS: 8.0000 mg | ORAL_TABLET | Freq: Three times a day (TID) | ORAL | 2 refills | Status: AC | PRN

## 2016-07-25 MED ORDER — ONDANSETRON HCL 4 MG PO TABS: 8.0000 mg | ORAL_TABLET | Freq: Three times a day (TID) | ORAL | Status: DC | PRN

## 2016-07-25 NOTE — Progress Notes (Signed)
Patient discharged home. Plan to complete radiation and then enroll in hospice services. Received a call from her daughter Cecille Rubin that patient was having moderate to severe nausea despite BID dosing of Reglan. I recommended increasing the dose to a max of '10mg'$  TID and will call in a script for Zofran to be taken PRN in addition to the Reglan.   Cecille Rubin tells me she does not need in home PT servcies- that they are not necessary-and likely will not be all that helpful. She is open to hospice care and is supposed to call me when she is ready to have someone come out to her house. I let hger know they could do a pre-admission visit to discuss services offered etc- it may be helpful to get her connected sooner rather than later- Ms. Sleep is fragile and I want to make sure Cecille Rubin has as much support as possible. Her next hurdle is figuring out how to care for her mom when her daughter leaves on March 9th. I will continue to follow and help with her home care plan until she enrolls in hospice.  Lane Hacker, DO Palliative Medicine

## 2016-07-26 ENCOUNTER — Ambulatory Visit
Admission: RE | Admit: 2016-07-26 | Discharge: 2016-07-26 | Disposition: A | Discharge status: Home / Self Care | Payer: Medicare Other | Source: Ambulatory Visit | Attending: Radiation Oncology | Admitting: Radiation Oncology

## 2016-07-26 DIAGNOSIS — C7951 Secondary malignant neoplasm of bone: Secondary | ICD-10-CM | POA: Diagnosis not present

## 2016-07-27 ENCOUNTER — Encounter: Payer: Self-pay | Admitting: Radiation Oncology

## 2016-07-27 ENCOUNTER — Ambulatory Visit
Admission: RE | Admit: 2016-07-27 | Discharge: 2016-07-27 | Disposition: A | Discharge status: Home / Self Care | Payer: Medicare Other | Source: Ambulatory Visit | Attending: Radiation Oncology | Admitting: Radiation Oncology

## 2016-07-27 DIAGNOSIS — C7951 Secondary malignant neoplasm of bone: Secondary | ICD-10-CM | POA: Diagnosis not present

## 2016-07-27 NOTE — Progress Notes (Addendum)
Weekly rad txs 5/5 L4-sacrum,  Loose stools,  Pain better, appetie good, fatigued, sleeps well with pain pill, no ambien stated, 1 month f/u with [facility] in March, takes '4mg'$  decadron 2x day 2:56 PM BP 132/64 (BP Location: Right Arm, Patient Position: Sitting, Cuff Size: Normal)   Pulse 64   Temp 98.2 F (36.8 C)   Resp 20   Wt Readings from Last 3 Encounters:  07/13/16 150 lb (68 kg)  07/13/16 150 lb 3.2 oz (68.1 kg)  06/01/16 153 lb 9.6 oz (69.7 kg)

## 2016-07-29 DIAGNOSIS — C7931 Secondary malignant neoplasm of brain: Secondary | ICD-10-CM | POA: Insufficient documentation

## 2016-07-29 DIAGNOSIS — C7951 Secondary malignant neoplasm of bone: Secondary | ICD-10-CM | POA: Insufficient documentation

## 2016-07-29 NOTE — Progress Notes (Signed)
The patient has decided to forego whole brain radiation treatments.  At this time, the patient therefore will remain on steroids area did she does wish to proceed with palliative radiation treatment to the lower back/pelvis and we therefore will proceed with this. She is having pain in this area and hopefully we can improve her quality of life.  We will shorten her treatment to a total of 5 fractions.  ------------------------------------------------  Jodelle Gross, MD, PhD

## 2016-07-29 NOTE — Progress Notes (Signed)
Department of Radiation Oncology  Phone:  [phone] Fax:        [phone]  Weekly Treatment Note    Name: [name] Date: 07/29/2016 MRN: [id number] DOB: 12-Sep-1940   Current dose: 20 Gy  Current fraction: 5   MEDICATIONS: Current Outpatient Prescriptions  Medication Sig Dispense Refill  . acetaminophen (TYLENOL) 500 MG tablet Take 1,000 mg by mouth every 6 (six) hours as needed for mild pain, moderate pain or headache. Takes for pain    . amLODipine (NORVASC) 10 MG tablet Take 1 tablet (10 mg total) by mouth daily. 90 tablet 3  . aspirin EC 81 MG tablet Take 81 mg by mouth daily.    Marland Kitchen atorvastatin (LIPITOR) 80 MG tablet Take 80 mg by mouth every evening.    . bisacodyl (DULCOLAX) 5 MG EC tablet Take 1 tablet (5 mg total) by mouth daily as needed for moderate constipation. 30 tablet 0  . clopidogrel (PLAVIX) 75 MG tablet Take 1 tablet (75 mg total) by mouth daily. 90 tablet 3  . dexamethasone (DECADRON) 4 MG tablet Take 1 tablet (4 mg total) by mouth 2 (two) times daily. 60 tablet 0  . LORazepam (ATIVAN) 0.5 MG tablet 1/2- 1 tab po 30 minutes prior to radiation or MRI 30 tablet 0  . metoCLOPramide (REGLAN) 5 MG tablet Take 1 tablet (5 mg total) by mouth 2 (two) times daily. 60 tablet 1  . metoprolol (LOPRESSOR) 50 MG tablet Take 1 tablet (50 mg total) by mouth 2 (two) times daily. 180 tablet 3  . Multiple Vitamins-Minerals (MULTIVITAMIN WITH MINERALS) tablet Take 1 tablet by mouth daily.     . nitroGLYCERIN (NITROSTAT) 0.4 MG SL tablet Place 0.4 mg under the tongue every 5 (five) minutes as needed for chest pain.    Marland Kitchen ondansetron (ZOFRAN) 8 MG tablet Take 1 tablet (8 mg total) by mouth every 8 (eight) hours as needed for nausea, vomiting or refractory nausea / vomiting. 60 tablet 2  . oxyCODONE (OXY IR/ROXICODONE) 5 MG immediate release tablet Take 1 tablet (5 mg total) by mouth every 3 (three) hours as needed for moderate pain. 60 tablet 0  . pantoprazole (PROTONIX)  40 MG tablet Take 1 tablet (40 mg total) by mouth daily. 30 tablet 6  . polyethylene glycol (MIRALAX / GLYCOLAX) packet Take 17 g by mouth 2 (two) times daily. 30 each 9  . senna-docusate (SENOKOT-S) 8.6-50 MG tablet Take 1 tablet by mouth 2 (two) times daily. 60 tablet 6  . traMADol (ULTRAM) 50 MG tablet Take 1 tablet (50 mg total) by mouth every 6 (six) hours as needed for moderate pain. 60 tablet 0  . magnesium oxide (MAG-OX) 400 (241.3 Mg) MG tablet Take 1 tablet by mouth 2 (two) times daily.  3  . zolpidem (AMBIEN) 5 MG tablet Take 1 tablet (5 mg total) by mouth at bedtime as needed for sleep. (Patient not taking: Reported on 07/27/2016) 30 tablet 0   No current facility-administered medications for this encounter.      ALLERGIES: Patient has no known allergies.   LABORATORY DATA:  Lab Results  Component Value Date   WBC 12.0 (H) 07/15/2016   HGB 10.1 (L) 07/15/2016   HCT 31.9 (L) 07/15/2016   MCV 92.2 07/15/2016   PLT 232 07/15/2016   Lab Results  Component Value Date   NA 138 07/24/2016   K 5.2 (H) 07/24/2016   CL 100 (L) 07/24/2016   CO2 28 07/24/2016   Lab Results  Component Value Date   ALT 35 07/24/2016   AST 36 07/24/2016   ALKPHOS 141 (H) 07/24/2016   BILITOT 0.4 07/24/2016     NARRATIVE: [name] was seen today for weekly treatment management. The chart was checked and the patient's films were reviewed.   The patient finished her final fraction today. She states that the pain in the lower back is improved. He discussed tended to follow-up with [place]. Weekly rad txs 5/5 L4-sacrum,  Loose stools,  Pain better, appetie good, fatigued, sleeps well with pain pill, no ambien stated, 1 month f/u with [facility] in March, takes '4mg'$  decadron 2x day 11:58 AM BP 132/64 (BP Location: Right Arm, Patient Position: Sitting, Cuff Size: Normal)   Pulse 64   Temp 98.2 F (36.8 C)   Resp 20   Wt Readings from Last 3 Encounters:  07/13/16 150 lb (68 kg)  07/13/16 150 lb  3.2 oz (68.1 kg)  06/01/16 153 lb 9.6 oz (69.7 kg)    PHYSICAL EXAMINATION: temperature is 98.2 F (36.8 C). Her blood pressure is 132/64 and her pulse is 64. Her respiration is 20.        ASSESSMENT: The patient did satisfactorily with treatment.  PLAN: The patient will follow-up in our clinic in 1 month.  The patient is proceeding with supportive care. We discussed today that we would be happy to see her in one month but this is not absolutely necessary given how she is doing at that time.

## 2016-07-29 NOTE — Progress Notes (Addendum)
  Radiation Oncology         [phone] ________________________________  Name: [name] MRN: [id number]  Date: 07/19/2016  DOB: [birth year]  SIMULATION AND TREATMENT PLANNING NOTE  DIAGNOSIS:     ICD-9-CM ICD-10-CM   1. Bone metastasis (HCC) 198.5 C79.51   2. Brain metastasis ([place]) 198.3 C79.31      Site:   1.  Whole brain radiation treatment 2.  Lower lumbar spine/sacrum  NARRATIVE:  The patient was brought to the [facility].  Identity was confirmed.  All relevant records and images related to the planned course of therapy were reviewed.   Written consent to proceed with treatment was confirmed which was freely given after reviewing the details related to the planned course of therapy had been reviewed with the patient.  Then, the patient was set-up in a stable reproducible  supine position for radiation therapy.  CT images were obtained.  Surface markings were placed.    Medically necessary complex treatment device(s) for immobilization:  1.  Customized vac lock bag, 2.  customized thermoplastic head cast.   The CT images were loaded into the planning software.  Then the target and avoidance structures were contoured.  Treatment planning then occurred.  The radiation prescription was entered and confirmed.  A total of 8 complex treatment devices were fabricated which relate to the designed radiation treatment fields: A 4 field technique will be utilized for the lower lumbar spine/sacrum and a total of 4 fields have been designed for whole brain radiation treatment including to reduce fields to improve dose homogeneity  . Each of these customized fields/ complex treatment devices will be used on a daily basis during the radiation course. I have requested : Isodose Plan.   PLAN:  The patient will receive 30Gy in 10 fractions.  ________________________________   Jodelle Gross, MD, PhD

## 2016-07-30 ENCOUNTER — Ambulatory Visit: Payer: Medicare Other

## 2016-07-31 ENCOUNTER — Other Ambulatory Visit: Payer: Self-pay | Admitting: Internal Medicine

## 2016-07-31 ENCOUNTER — Ambulatory Visit: Payer: Medicare Other

## 2016-07-31 DIAGNOSIS — C7951 Secondary malignant neoplasm of bone: Principal | ICD-10-CM

## 2016-07-31 DIAGNOSIS — C3490 Malignant neoplasm of unspecified part of unspecified bronchus or lung: Secondary | ICD-10-CM

## 2016-07-31 NOTE — Progress Notes (Unsigned)
Call received from patient's daughter. They are requesting referral to [facility]-would like to have preliminary discussion about services. I will send referral and follow.  Lane Hacker, DO Palliative Medicine

## 2016-08-01 ENCOUNTER — Ambulatory Visit: Payer: Medicare Other

## 2016-08-02 ENCOUNTER — Other Ambulatory Visit: Payer: Self-pay | Admitting: Hematology and Oncology

## 2016-08-02 ENCOUNTER — Ambulatory Visit: Payer: Medicare Other

## 2016-08-02 ENCOUNTER — Encounter (HOSPITAL_COMMUNITY): Payer: Self-pay

## 2016-08-02 DIAGNOSIS — C7931 Secondary malignant neoplasm of brain: Secondary | ICD-10-CM

## 2016-08-02 DIAGNOSIS — C7951 Secondary malignant neoplasm of bone: Secondary | ICD-10-CM

## 2016-08-03 ENCOUNTER — Other Ambulatory Visit (HOSPITAL_BASED_OUTPATIENT_CLINIC_OR_DEPARTMENT_OTHER): Payer: Medicare Other

## 2016-08-03 ENCOUNTER — Ambulatory Visit (HOSPITAL_BASED_OUTPATIENT_CLINIC_OR_DEPARTMENT_OTHER): Payer: Medicare Other | Admitting: Hematology and Oncology

## 2016-08-03 ENCOUNTER — Ambulatory Visit: Payer: Medicare Other

## 2016-08-03 ENCOUNTER — Encounter: Payer: Self-pay | Admitting: Hematology and Oncology

## 2016-08-03 DIAGNOSIS — G893 Neoplasm related pain (acute) (chronic): Secondary | ICD-10-CM | POA: Diagnosis not present

## 2016-08-03 DIAGNOSIS — C7951 Secondary malignant neoplasm of bone: Secondary | ICD-10-CM

## 2016-08-03 DIAGNOSIS — Z8572 Personal history of non-Hodgkin lymphomas: Secondary | ICD-10-CM

## 2016-08-03 DIAGNOSIS — C3491 Malignant neoplasm of unspecified part of right bronchus or lung: Secondary | ICD-10-CM | POA: Diagnosis not present

## 2016-08-03 DIAGNOSIS — C7931 Secondary malignant neoplasm of brain: Secondary | ICD-10-CM

## 2016-08-03 DIAGNOSIS — Z66 Do not resuscitate: Secondary | ICD-10-CM

## 2016-08-03 DIAGNOSIS — R64 Cachexia: Secondary | ICD-10-CM | POA: Insufficient documentation

## 2016-08-03 LAB — COMPREHENSIVE METABOLIC PANEL
ALBUMIN: 2.4 g/dL — AB (ref 3.5–5.0)
ALT: 64 U/L — AB (ref 0–55)
AST: 39 U/L — ABNORMAL HIGH (ref 5–34)
Alkaline Phosphatase: 170 U/L — ABNORMAL HIGH (ref 40–150)
Anion Gap: 10 mEq/L (ref 3–11)
BILIRUBIN TOTAL: 0.42 mg/dL (ref 0.20–1.20)
BUN: 33.2 mg/dL — AB (ref 7.0–26.0)
CALCIUM: 8.5 mg/dL (ref 8.4–10.4)
CO2: 21 mEq/L — ABNORMAL LOW (ref 22–29)
CREATININE: 0.9 mg/dL (ref 0.6–1.1)
Chloride: 106 mEq/L (ref 98–109)
EGFR: 62 mL/min/{1.73_m2} — ABNORMAL LOW (ref >90)
Glucose: 230 mg/dl — ABNORMAL HIGH (ref 70–140)
Potassium: 4.7 mEq/L (ref 3.5–5.1)
Sodium: 136 mEq/L (ref 136–145)
Total Protein: 5.1 g/dL — ABNORMAL LOW (ref 6.4–8.3)

## 2016-08-03 LAB — CBC WITH DIFFERENTIAL/PLATELET
BASO%: 0.1 % (ref 0.0–2.0)
BASOS ABS: 0 10*3/uL (ref 0.0–0.1)
EOS%: 0 % (ref 0.0–7.0)
Eosinophils Absolute: 0 10*3/uL (ref 0.0–0.5)
HEMATOCRIT: 38.1 % (ref 34.8–46.6)
HEMOGLOBIN: 12.1 g/dL (ref 11.6–15.9)
LYMPH#: 0.1 10*3/uL — AB (ref 0.9–3.3)
LYMPH%: 1 % — ABNORMAL LOW (ref 14.0–49.7)
MCH: 28.8 pg (ref 25.1–34.0)
MCHC: 31.9 g/dL (ref 31.5–36.0)
MCV: 90.3 fL (ref 79.5–101.0)
MONO#: 0.1 10*3/uL (ref 0.1–0.9)
MONO%: 1.3 % (ref 0.0–14.0)
NEUT#: 6.1 10*3/uL (ref 1.5–6.5)
NEUT%: 97.6 % — ABNORMAL HIGH (ref 38.4–76.8)
PLATELETS: 132 10*3/uL — AB (ref 145–400)
RBC: 4.22 10*6/uL (ref 3.70–5.45)
RDW: 16.9 % — AB (ref 11.2–14.5)
WBC: 6.3 10*3/uL (ref 3.9–10.3)

## 2016-08-03 NOTE — Assessment & Plan Note (Signed)
The patient is aware she has incurable disease  We discussed importance of Advanced Directives and Living will. We discussed CODE STATUS; the patient desires for DNR She is currently enrolled in hospice. No future follow-up is necessary.

## 2016-08-03 NOTE — Assessment & Plan Note (Signed)
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 Low-dose dexamethasone has improved appetite but is causing severe reflux. I recommend we reduce a dose of dexamethasone to twice a day 2 mg.

## 2016-08-03 NOTE — Assessment & Plan Note (Signed)
Per discussion in the hospital, we will not pursue whole brain radiation treatment.  She is comfortable with the plan. She is on low-dose steroid therapy

## 2016-08-03 NOTE — Assessment & Plan Note (Signed)
We discussed pain management. She is taking oxycodone as needed along with tramadol. She is comfortable with current pain regimen

## 2016-08-03 NOTE — Assessment & Plan Note (Signed)
The patient has enrolled recently with home based palliative care/hospice.  We reviewed recent foundation one testing.  No actionable mutations were noted. She has poor baseline performance status with ECOG performance status score of 3. The patient has made informed decision not to pursue palliative chemotherapy and is comfortable with the plan for hospice

## 2016-08-03 NOTE — Assessment & Plan Note (Signed)
The malignant hypercalcemia is currently well controlled with low-dose dexamethasone.  Continue the same

## 2016-08-03 NOTE — Progress Notes (Signed)
[facility] OFFICE PROGRESS NOTE  Patient Care Team: No Pcp Per Patient as PCP - General (General Practice) [name], MD as Consulting Physician (Hematology and Oncology)  SUMMARY OF ONCOLOGIC HISTORY:   Non-small cell cancer of right lung ([place])   07/13/2016 - 07/24/2016 Hospital Admission    She was admitted to the hospital for severe pain and hypercalcemia.  Subsequent evaluation revealed diffuse metastatic lung cancer to bone, liver, peritoneum and the brain.  She received palliative treatment to painful bone metastasis.      07/14/2016 Imaging    1. There are multiple new lesions consistent with metastatic disease. There is a dominant right upper lobe mass with additional bilateral pulmonary nodules, mediastinal and right hilar adenopathy, several hepatic lesions, a new left adrenal nodule, an omental peritoneal implant and at least 1 lytic osseous lesion. Findings could be secondary to recurrent multifocal lymphoma, although pattern is atypical for lymphoma, and primary lung cancer should be considered given the size of the right upper lobe lesion. 2. Additional subcutaneous nodule in the right upper back could reflect a metastasis. 3. Chronic lung disease with associated atelectasis and scattered ground-glass densities. 4. Indeterminate intermediate density lesion in the posterior interpolar region of the right kidney. 5. Age-indeterminate L5 compression fracture.      07/16/2016 Genetic Testing    Patient has genetic testing done for PD1 and [employer] One. No actionable mutation is detected      07/17/2016 Imaging    MRI lumbar: Diffuse osseous metastasis.  Moderate L5 pathologic fracture. Degenerative lumbar spine resulting in severe canal stenosis L4-5, very mild canal stenosis L2-3 and L3-4. Minimal to mild neural foraminal narrowing L4-5 and L5-S1. Low signal LEFT presacral soft tissues most consistent with tumoral extension which could affect the LEFT sciatic nerve.       07/17/2016 Imaging    MRI brain without contrast showed limited assessment due to moderately motion degraded post gadolinium sequences. At least 6 supra and single infratentorial subcentimeter metastasis. RIGHT parietal calvarial metastasis. 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 Recommend CTA HEAD and neck for further characterization. Moderate to severe white matter changes compatible chronic small vessel ischemic disease and a component of vasogenic edema. 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      07/18/2016 Imaging    MRI brain with contrast: Greater than 20 supratentorial, 1 left cerebellar hemisphere, and 1 pons metastasis much better appreciated when compared with prior MRI of the brain. The largest lesion measures 9 mm in the left lateral temporal lobe. No evidence of acute infarct, intracranial hemorrhage, or significant mass effect. 3. Stable advanced chronic microvascular ischemic changes and moderate brain parenchymal volume loss. 4. Stable enhancing lesion in right parietal bone likely metastasis.      07/23/2016 - 07/27/2016 Radiation Therapy    Current dose: 20 Gy  Current fraction: 5        INTERVAL HISTORY: Please see below for problem oriented charting. She returns with her daughter today for further follow-up. She has completed radiation treatment. Her pain is manageable.  She continues to have poor appetite but has gained some weight.  She had complain of some recent reflux.  She has some mild constipation, well managed with laxatives. Denies recent headache or new neurological deficits  REVIEW OF SYSTEMS:   Constitutional: Denies fevers, chills or abnormal weight loss Eyes: Denies blurriness of vision Ears, nose, mouth, throat, and face: Denies mucositis or sore throat Respiratory: Denies cough, dyspnea or wheezes Cardiovascular:  Denies palpitation, chest discomfort or lower extremity swelling Skin: Denies abnormal skin  rashes Lymphatics: Denies new lymphadenopathy or easy bruising Neurological:Denies numbness, tingling or new weaknesses Behavioral/Psych: Mood is stable, no new changes  All other systems were reviewed with the patient and are negative.  I have reviewed the past medical history, past surgical history, social history and family history with the patient and they are unchanged from previous note.  ALLERGIES:  has No Known Allergies.  MEDICATIONS:  Current Outpatient Prescriptions  Medication Sig Dispense Refill  . acetaminophen (TYLENOL) 500 MG tablet Take 1,000 mg by mouth every 6 (six) hours as needed for mild pain, moderate pain or headache. Takes for pain    . amLODipine (NORVASC) 10 MG tablet Take 1 tablet (10 mg total) by mouth daily. 90 tablet 3  . atorvastatin (LIPITOR) 80 MG tablet Take 80 mg by mouth every evening.    . bisacodyl (DULCOLAX) 5 MG EC tablet Take 1 tablet (5 mg total) by mouth daily as needed for moderate constipation. 30 tablet 0  . clopidogrel (PLAVIX) 75 MG tablet Take 1 tablet (75 mg total) by mouth daily. 90 tablet 3  . dexamethasone (DECADRON) 4 MG tablet Take 1 tablet (4 mg total) by mouth 2 (two) times daily. 60 tablet 0  . metoCLOPramide (REGLAN) 5 MG tablet Take 1 tablet (5 mg total) by mouth 2 (two) times daily. 60 tablet 1  . metoprolol (LOPRESSOR) 50 MG tablet Take 1 tablet (50 mg total) by mouth 2 (two) times daily. 180 tablet 3  . Multiple Vitamins-Minerals (MULTIVITAMIN WITH MINERALS) tablet Take 1 tablet by mouth daily.     . nitroGLYCERIN (NITROSTAT) 0.4 MG SL tablet Place 0.4 mg under the tongue every 5 (five) minutes as needed for chest pain.    Marland Kitchen ondansetron (ZOFRAN) 8 MG tablet Take 1 tablet (8 mg total) by mouth every 8 (eight) hours as needed for nausea, vomiting or refractory nausea / vomiting. 60 tablet 2  . oxyCODONE (OXY IR/ROXICODONE) 5 MG immediate release tablet Take 1 tablet (5 mg total) by mouth every 3 (three) hours as needed for moderate  pain. 60 tablet 0  . pantoprazole (PROTONIX) 40 MG tablet Take 1 tablet (40 mg total) by mouth daily. 30 tablet 6  . polyethylene glycol (MIRALAX / GLYCOLAX) packet Take 17 g by mouth 2 (two) times daily. 30 each 9  . senna-docusate (SENOKOT-S) 8.6-50 MG tablet Take 1 tablet by mouth 2 (two) times daily. 60 tablet 6  . traMADol (ULTRAM) 50 MG tablet Take 1 tablet (50 mg total) by mouth every 6 (six) hours as needed for moderate pain. 60 tablet 0   No current facility-administered medications for this visit.     PHYSICAL EXAMINATION: ECOG PERFORMANCE STATUS: 3 - Symptomatic, >50% confined to bed  Vitals:   08/03/16 1459  BP: (!) 89/54  Pulse: 61  Resp: 17  Temp: 97.7 F (36.5 C)   Filed Weights   08/03/16 1459  Weight: 155 lb 1.6 oz (70.4 kg)    GENERAL:alert, no distress and comfortable.  She looks frail and cachectic SKIN: skin color, texture, turgor are normal, no rashes or significant lesions EYES: normal, Conjunctiva are pink and non-injected, sclera clear Musculoskeletal:no cyanosis of digits and no clubbing  NEURO: alert & oriented x 3 with fluent speech, no focal motor/sensory deficits  LABORATORY DATA:  I have reviewed the data as listed    Component Value Date/Time   NA 136 08/03/2016 1429   K  4.7 08/03/2016 1429   CL 100 (L) 07/24/2016 0341   CL 103 09/12/2012 0926   CO2 21 (L) 08/03/2016 1429   GLUCOSE 230 (H) 08/03/2016 1429   GLUCOSE 100 (H) 09/12/2012 0926   BUN 33.2 (H) 08/03/2016 1429   CREATININE 0.9 08/03/2016 1429   CALCIUM 8.5 08/03/2016 1429   PROT 5.1 (L) 08/03/2016 1429   ALBUMIN 2.4 (L) 08/03/2016 1429   AST 39 (H) 08/03/2016 1429   ALT 64 (H) 08/03/2016 1429   ALKPHOS 170 (H) 08/03/2016 1429   BILITOT 0.42 08/03/2016 1429   GFRNONAA 57 (L) 07/24/2016 0341   GFRAA >60 07/24/2016 0341    No results found for: SPEP, UPEP  Lab Results  Component Value Date   WBC 6.3 08/03/2016   NEUTROABS 6.1 08/03/2016   HGB 12.1 08/03/2016   HCT  38.1 08/03/2016   MCV 90.3 08/03/2016   PLT 132 (L) 08/03/2016      Chemistry      Component Value Date/Time   NA 136 08/03/2016 1429   K 4.7 08/03/2016 1429   CL 100 (L) 07/24/2016 0341   CL 103 09/12/2012 0926   CO2 21 (L) 08/03/2016 1429   BUN 33.2 (H) 08/03/2016 1429   CREATININE 0.9 08/03/2016 1429      Component Value Date/Time   CALCIUM 8.5 08/03/2016 1429   ALKPHOS 170 (H) 08/03/2016 1429   AST 39 (H) 08/03/2016 1429   ALT 64 (H) 08/03/2016 1429   BILITOT 0.42 08/03/2016 1429       RADIOGRAPHIC STUDIES: I have personally reviewed the radiological images as listed and agreed with the findings in the report. Ct Chest W Contrast  Result Date: 07/14/2016 CLINICAL DATA:  Non-Hodgkin's lymphoma. Severe uncontrolled back pain with hypercalcemia and elevated LDH levels. Evaluate for recurrence. EXAM: CT CHEST, ABDOMEN, AND PELVIS WITH CONTRAST TECHNIQUE: Multidetector CT imaging of the chest, abdomen and pelvis was performed following the standard protocol during bolus administration of intravenous contrast. CONTRAST:  71m ISOVUE-300 IOPAMIDOL (ISOVUE-300) INJECTION 61%, 1075mISOVUE-300 IOPAMIDOL (ISOVUE-300) INJECTION 61% COMPARISON:  Prior CTs 09/12/2012 FINDINGS: CT CHEST FINDINGS Cardiovascular: There is diffuse atherosclerosis of the aorta, great vessels and coronary arteries. No acute vascular findings are seen. The heart size is normal. There is no pericardial effusion. Mediastinum/Nodes: There is new mediastinal and right hilar lymphadenopathy. Right paratracheal node measures 2.9 x 2.4 cm on image 25. There is a subcarinal node measuring 2.6 x 1.6 cm on image 31 and a right hilar node measuring 2.0 x 1.3 cm on image 28. There is a large paramediastinal mass adjacent to the ascending aorta which is probably a pulmonary lesion, described below. The thyroid gland, trachea and esophagus demonstrate no significant findings. Lungs/Pleura: There is no pleural effusion. There is  dependent bibasilar atelectasis. As above, there is a large right upper lobe mass abutting the anterior mediastinum. This measures 5.9 x 4.2 cm on image 70. Additional pulmonary nodules are present, including a 1.2 cm right lower lobe nodule on image 106. Some of these are ground-glass, including ill-defined components in the left upper lobe which have progressed compared with the prior study. Mild underlying emphysema noted. Musculoskeletal/Chest wall: Mild thoracic spine compression deformities are stable. There are no worrisome osseous findings. There is a new soft tissue nodule in the subcutaneous fat of the right upper back, overlying the right scapula. This measures 11 mm on image 16. CT ABDOMEN AND PELVIS FINDINGS Hepatobiliary: There is a new dominant mass in the lateral segment  of left hepatic lobe which measures 5.6 x 5.5 cm on image 61. There are additional smaller lesions within the right lobe measuring 12 mm on image 54. There is a smaller lesion more inferiorly on image 63. There is chronic extrahepatic biliary dilatation status post cholecystectomy which appears similar to the prior study. Pancreas: The pancreas is atrophied. There is no evidence of pancreatic mass, surrounding inflammation or ductal dilatation. Spleen: Normal in size without focal abnormality. Adrenals/Urinary Tract: There is increased nodularity of the left adrenal gland which demonstrates a 1.9 x 1.4 cm nodule on image 62. The right adrenal gland appears normal. There is a new intermediate density nodule in the posterior aspect of the right kidney measuring 1.4 cm on image 22 of series 7. This has an indeterminate soft tissue attenuation. The kidneys otherwise appear stable with small cysts bilaterally. There is some cortical thinning and perinephric soft tissue stranding on the left. No evidence of hydronephrosis or ureteral calculus. The bladder appears unremarkable. Stomach/Bowel: No evidence of bowel wall thickening, distention  or surrounding inflammatory change. The stomach is incompletely distended and suboptimally evaluated. Vascular/Lymphatic: There are no enlarged abdominal or pelvic lymph nodes. There is diffuse aortic and branch vessel atherosclerosis. No evidence of large vessel occlusion. Reproductive: The uterus is atrophied.  No evidence of adnexal mass. Other: There is a new left upper quadrant omental mass measuring 2.0 x 2.1 cm on image 71. No ascites or generalized peritoneal nodularity. Musculoskeletal: Interval development of a biconcave compression deformity at L5. No underlying lytic lesion is seen within the vertebral body, although there is a lytic lesion involving the right L4 transverse process, most obvious on the coronal images. Previous left proximal femoral ORIF noted. IMPRESSION: 1. There are multiple new lesions consistent with metastatic disease. There is a dominant right upper lobe mass with additional bilateral pulmonary nodules, mediastinal and right hilar adenopathy, several hepatic lesions, a new left adrenal nodule, an omental peritoneal implant and at least 1 lytic osseous lesion. Findings could be secondary to recurrent multifocal lymphoma, although pattern is atypical for lymphoma, and primary lung cancer should be considered given the size of the right upper lobe lesion. 2. Additional subcutaneous nodule in the right upper back could reflect a metastasis. 3. Chronic lung disease with associated atelectasis and scattered ground-glass densities. 4. Indeterminate intermediate density lesion in the posterior interpolar region of the right kidney. 5. Age-indeterminate L5 compression fracture. Electronically Signed   By: Richardean Sale M.D.   On: 07/14/2016 11:59   [name] LF Contrast  Result Date: 07/18/2016 CLINICAL DATA:  76 y/o F; history of lymphoma and metastatic lung cancer. EXAM: MRI HEAD WITHOUT AND WITH CONTRAST TECHNIQUE: Multiplanar, multiecho pulse sequences of the brain and  surrounding structures were obtained without and with intravenous contrast. CONTRAST:  29m MULTIHANCE GADOBENATE DIMEGLUMINE 529 MG/ML IV SOLN COMPARISON:  07/17/2016 MRI of the brain. FINDINGS: Brain: No diffusion signal abnormality. No abnormal susceptibility hypointensity to indicate intracranial hemorrhage. Confluent T2 FLAIR hyperintense signal abnormality throughout subcortical and periventricular white matter is compatible with advanced chronic microvascular ischemic changes. Moderate brain parenchymal volume loss. Greater than 20 enhancing metastasis are identified scattered throughout the supratentorial brain and there additional metastasis in the left cerebellar hemisphere on the right anterior pons. The largest metastasis are present within the right anterolateral frontal lobe measuring 7 mm (series 10, image 91) and within the left lateral temporal lobe measuring 9 mm (series 10, image 83). There is minimal associated vasogenic edema with  the metastasis. 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 Skull and upper cervical spine: Enhancing lesion in the right parietal bone is likely an osseous metastasis. Sinuses/Orbits: Negative. Other: None. IMPRESSION: 1. Greater than 20 supratentorial, 1 left cerebellar hemisphere, and 1 pons metastasis much better appreciated when compared with prior MRI of the brain. The largest lesion measures 9 mm in the left lateral temporal lobe. 2. No evidence of acute infarct, intracranial hemorrhage, or significant mass effect. 3. Stable advanced chronic microvascular ischemic changes and moderate brain parenchymal volume loss. 4. Stable enhancing lesion in right parietal bone likely metastasis. Electronically Signed   By: Kristine Garbe M.D.   On: 07/18/2016 19:32   [name] BT Contrast  Result Date: 07/17/2016 CLINICAL DATA:  History of lymphoma and  metastatic lung cancer. Intermittent confusion, assess for intracranial metastasis. History of hypertension. EXAM: MRI HEAD WITHOUT AND WITH CONTRAST TECHNIQUE: Multiplanar, multiecho pulse sequences of the brain and surrounding structures were obtained without and with intravenous contrast. CONTRAST:  14 cc MultiHance COMPARISON:  None. FINDINGS: Post gadolinium sequences are moderately motion degraded. INTRACRANIAL CONTENTS: No reduced diffusion to suggest acute ischemia or intracranial hypercellular tumor. No susceptibility artifact to suggest hemorrhage. The ventricles and sulci are normal for patient's age. Confluent supratentorial and to lesser extent pontine white matter FLAIR T2 hyperintensities. Old bilateral putaminal lacunar infarcts. Ring-enhancing RIGHT frontal juxta cortical 8 mm lesion. Solidly enhancing subcentimeter RIGHT frontal/insular enhancing nodule. 3 mm RIGHT frontal lobe nodule. 4 mm LEFT frontal lobe nodule. 7 mm ring-enhancing LEFT posterior temporal lobe lesion, 3 mm homogeneously enhancing LEFT parietal metastasis. 3 mm LEFT cerebellar nodule. No abnormal extra-axial fluid collections. No extra-axial masses. VASCULAR: T2 bright signal LEFT vertebral artery most compatible with occlusion. 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 Dolicoectatic intracranial vessels compatible chronic hypertension. SKULL AND UPPER CERVICAL SPINE: No abnormal sellar expansion. Patchy reduced diffusion RIGHT parietal calvarium consistent with metastatic disease, associated expansile low T1 signal. No suspicious calvarial bone marrow signal. Craniocervical junction maintained. SINUSES/ORBITS: The mastoid air-cells and included paranasal sinuses are well-aerated.The included ocular globes and orbital contents are non-suspicious. OTHER: None. IMPRESSION: Limited assessment due to moderately motion degraded post gadolinium sequences. At least 6 supra and single infratentorial subcentimeter metastasis. RIGHT parietal  calvarial metastasis. 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 Recommend CTA HEAD and neck for further characterization. Moderate to severe white matter changes compatible chronic small vessel ischemic disease and a component of vasogenic edema. 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 Electronically Signed   By: Elon Alas M.D.   On: 07/17/2016 17:21   Mr Lumbar Spine W Wo Contrast  Result Date: 07/17/2016 CLINICAL DATA:  Low back pain. History of lymphoma and metastatic lung cancer. EXAM: MRI LUMBAR SPINE WITHOUT AND WITH CONTRAST TECHNIQUE: Multiplanar and multiecho pulse sequences of the lumbar spine were obtained without and with intravenous contrast. CONTRAST:  43m MULTIHANCE GADOBENATE DIMEGLUMINE 529 MG/ML IV SOLN COMPARISON:  CT abdomen and pelvis July 04, 2016 FINDINGS: SEGMENTATION: For the purposes of this report, the last well-formed intervertebral disc will be described as L5-S1. ALIGNMENT: Maintenance of the lumbar lordosis. No malalignment. VERTEBRAE: Low T1, bright STIR and enhancing lesions all lumbar levels. Moderate L5 pathologic fracture, approximate 50% height loss without retropulsed bony fragments. Expansile metastasis RIGHT L4 transverse process. Low signal metastasis T11 partially imaged. Multiple sacral metastasis. Intervertebral discs demonstrate normal morphology, mildly  desiccation. Edema within the L5-S1 disc. No suspicious intradiscal enhancement. CONUS MEDULLARIS: Conus medullaris terminates at L1-2 and demonstrates normal morphology and signal characteristics. Cauda equina is normal. No suspicious cord or leptomeningeal enhancement. PARASPINAL AND SOFT TISSUES: Abnormal low signal partially imaged LEFT presacral soft tissues concerning for tumor extension, which could affect the sciatic nerve. Moderate symmetric paraspinal muscle atrophy. Mildly ectatic infrarenal aorta. DISC LEVELS: L1-2: No disc bulge. Mild facet arthropathy and ligamentum  flavum redundancy without canal stenosis or neural foraminal narrowing. L2-3: Small broad-based disc bulge. Mild facet arthropathy and ligamentum flavum redundancy. Very mild canal stenosis without neural foraminal narrowing. L3-4: Small broad-based disc bulge. Mild facet arthropathy and ligamentum flavum redundancy. Very mild canal stenosis. No neural foraminal narrowing. L4-5: 5 mm broad-based disc bulge. Moderate facet arthropathy and ligamentum flavum redundancy including 3 mm RIGHT facet synovial cyst within dorsal epidural space. Severe canal stenosis in trefoil configuration. Mild bilateral caudal neural foraminal narrowing. Subcentimeter LEFT facet synovial cyst extending into paraspinal soft tissues. L5-S1: Small broad-based disc bulge. Moderate facet arthropathy and ligamentum flavum redundancy without canal stenosis. Minimal neural foraminal narrowing. IMPRESSION: Diffuse osseous metastasis.  Moderate L5 pathologic fracture. Degenerative lumbar spine resulting in severe canal stenosis L4-5, very mild canal stenosis L2-3 and L3-4. Minimal to mild neural foraminal narrowing L4-5 and L5-S1. Low signal LEFT presacral soft tissues most consistent with tumoral extension which could affect the LEFT sciatic nerve. Electronically Signed   By: Elon Alas M.D.   On: 07/17/2016 16:57   Ct Abdomen Pelvis W Contrast  Result Date: 07/14/2016 CLINICAL DATA:  Non-Hodgkin's lymphoma. Severe uncontrolled back pain with hypercalcemia and elevated LDH levels. Evaluate for recurrence. EXAM: CT CHEST, ABDOMEN, AND PELVIS WITH CONTRAST TECHNIQUE: Multidetector CT imaging of the chest, abdomen and pelvis was performed following the standard protocol during bolus administration of intravenous contrast. CONTRAST:  25m ISOVUE-300 IOPAMIDOL (ISOVUE-300) INJECTION 61%, 1055mISOVUE-300 IOPAMIDOL (ISOVUE-300) INJECTION 61% COMPARISON:  Prior CTs 09/12/2012 FINDINGS: CT CHEST FINDINGS Cardiovascular: There is diffuse  atherosclerosis of the aorta, great vessels and coronary arteries. No acute vascular findings are seen. The heart size is normal. There is no pericardial effusion. Mediastinum/Nodes: There is new mediastinal and right hilar lymphadenopathy. Right paratracheal node measures 2.9 x 2.4 cm on image 25. There is a subcarinal node measuring 2.6 x 1.6 cm on image 31 and a right hilar node measuring 2.0 x 1.3 cm on image 28. There is a large paramediastinal mass adjacent to the ascending aorta which is probably a pulmonary lesion, described below. The thyroid gland, trachea and esophagus demonstrate no significant findings. Lungs/Pleura: There is no pleural effusion. There is dependent bibasilar atelectasis. As above, there is a large right upper lobe mass abutting the anterior mediastinum. This measures 5.9 x 4.2 cm on image 70. Additional pulmonary nodules are present, including a 1.2 cm right lower lobe nodule on image 106. Some of these are ground-glass, including ill-defined components in the left upper lobe which have progressed compared with the prior study. Mild underlying emphysema noted. Musculoskeletal/Chest wall: Mild thoracic spine compression deformities are stable. There are no worrisome osseous findings. There is a new soft tissue nodule in the subcutaneous fat of the right upper back, overlying the right scapula. This measures 11 mm on image 16. CT ABDOMEN AND PELVIS FINDINGS Hepatobiliary: There is a new dominant mass in the lateral segment of left hepatic lobe which measures 5.6 x 5.5 cm on image 61. There are additional smaller lesions within the right lobe  measuring 12 mm on image 54. There is a smaller lesion more inferiorly on image 63. There is chronic extrahepatic biliary dilatation status post cholecystectomy which appears similar to the prior study. Pancreas: The pancreas is atrophied. There is no evidence of pancreatic mass, surrounding inflammation or ductal dilatation. Spleen: Normal in size  without focal abnormality. Adrenals/Urinary Tract: There is increased nodularity of the left adrenal gland which demonstrates a 1.9 x 1.4 cm nodule on image 62. The right adrenal gland appears normal. There is a new intermediate density nodule in the posterior aspect of the right kidney measuring 1.4 cm on image 22 of series 7. This has an indeterminate soft tissue attenuation. The kidneys otherwise appear stable with small cysts bilaterally. There is some cortical thinning and perinephric soft tissue stranding on the left. No evidence of hydronephrosis or ureteral calculus. The bladder appears unremarkable. Stomach/Bowel: No evidence of bowel wall thickening, distention or surrounding inflammatory change. The stomach is incompletely distended and suboptimally evaluated. Vascular/Lymphatic: There are no enlarged abdominal or pelvic lymph nodes. There is diffuse aortic and branch vessel atherosclerosis. No evidence of large vessel occlusion. Reproductive: The uterus is atrophied.  No evidence of adnexal mass. Other: There is a new left upper quadrant omental mass measuring 2.0 x 2.1 cm on image 71. No ascites or generalized peritoneal nodularity. Musculoskeletal: Interval development of a biconcave compression deformity at L5. No underlying lytic lesion is seen within the vertebral body, although there is a lytic lesion involving the right L4 transverse process, most obvious on the coronal images. Previous left proximal femoral ORIF noted. IMPRESSION: 1. There are multiple new lesions consistent with metastatic disease. There is a dominant right upper lobe mass with additional bilateral pulmonary nodules, mediastinal and right hilar adenopathy, several hepatic lesions, a new left adrenal nodule, an omental peritoneal implant and at least 1 lytic osseous lesion. Findings could be secondary to recurrent multifocal lymphoma, although pattern is atypical for lymphoma, and primary lung cancer should be considered given  the size of the right upper lobe lesion. 2. Additional subcutaneous nodule in the right upper back could reflect a metastasis. 3. Chronic lung disease with associated atelectasis and scattered ground-glass densities. 4. Indeterminate intermediate density lesion in the posterior interpolar region of the right kidney. 5. Age-indeterminate L5 compression fracture. Electronically Signed   By: Richardean Sale M.D.   On: 07/14/2016 11:59   US Biopsy  Result Date: 07/16/2016 INDICATION: History of lymphoma, now with findings worrisome for a marked disease progression including development of a indeterminate nodule within the subcutaneous tissues about the right upper back. Please perform ultrasound-guided biopsy for tissue diagnostic purposes. EXAM: ULTRASOUND GUIDED LIVER LESION BIOPSY COMPARISON:  None. MEDICATIONS: None ANESTHESIA/SEDATION: None COMPLICATIONS: None immediate. PROCEDURE: Informed written consent was obtained from the patient after a discussion of the risks, benefits and alternatives to treatment. The patient understands and consents the procedure. A timeout was performed prior to the initiation of the procedure. Ultrasound scanning was performed of the right upper abdominal quadrant demonstrates an approximately 1.0 x 1.0 cm hypoechoic nodule within the subcutaneous tissues of the right lateral upper back compatible the nodule seen on preceding chest CT image 16, series 2. The procedure was planned. The skin overlying the right lateral upper back was prepped and draped in the usual sterile fashion. The overlying soft tissues were anesthetized with 1% lidocaine with epinephrine. A 17 gauge, 6.8 cm co-axial needle was advanced into a peripheral aspect of the lesion. No fluid was able able  to be aspirated from this hypoechoic nodule. As such, This was followed by 5 core biopsies with an 18 gauge core device under direct ultrasound guidance. The coaxial needle tract removed and superficial hemostasis was  obtained with manual compression. Post procedural scanning was negative for definitive area of hemorrhage or additional complication. A dressing was placed. The patient tolerated the procedure well without immediate post procedural complication. IMPRESSION: Technically successful ultrasound guided core needle biopsy of indeterminate approximately 1 cm nodule within the subcutaneous tissues of the right upper back. Electronically Signed   By: Sandi Mariscal M.D.   On: 07/16/2016 16:31    ASSESSMENT & PLAN:  Non-small cell cancer of right lung [facility]) The patient has enrolled recently with home based palliative care/hospice.  We reviewed recent foundation one testing.  No actionable mutations were noted. She has poor baseline performance status with ECOG performance status score of 3. The patient has made informed decision not to pursue palliative chemotherapy and is comfortable with the plan for hospice  Bone metastasis [facility]) Per discussion in the hospital, we will not pursue whole brain radiation treatment.  She is comfortable with the plan. She is on low-dose steroid therapy  Cancer associated pain We discussed pain management. She is taking oxycodone as needed along with tramadol. 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 Low-dose dexamethasone has improved appetite but is causing severe reflux. I recommend we reduce a dose of dexamethasone to twice a day 2 mg.  Hypercalcemia The malignant hypercalcemia is currently well controlled with low-dose dexamethasone.  Continue the same  DNR (do not resuscitate) The patient is aware she has incurable disease  We discussed importance of Advanced Directives and Living will. We discussed CODE STATUS; the patient desires for DNR She is currently enrolled in hospice. No future follow-up is necessary.   No orders of the defined types were placed in this  encounter.  All questions were answered. The patient knows to call the clinic with any problems, questions or concerns. No barriers to learning was detected. I spent 25 minutes counseling the patient face to face. The total time spent in the appointment was 30 minutes and more than 50% was on counseling and review of test results     [name], MD 08/03/2016 3:55 PM

## 2016-08-06 ENCOUNTER — Encounter: Payer: Self-pay | Admitting: Radiation Oncology

## 2016-08-06 NOTE — Progress Notes (Signed)
  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 During treatment, the patient reported improved lower back pain, fatigue, and a good appetite.   Plan: The patient has completed radiation treatment. The patient will return to radiation oncology clinic for routine followup in one month. I advised them to call or return sooner if they have any questions or concerns related to their recovery or treatment.  ------------------------------------------------  Jodelle Gross, MD, PhD  This document serves as a record of services personally performed by Kyung Rudd, MD. It was created on his behalf by Bethann Humble, a trained medical scribe. The creation of this record is based on the scribe's personal observations and the provider's statements to them. This document has been checked and approved by the attending provider.

## 2016-08-13 ENCOUNTER — Other Ambulatory Visit: Payer: Self-pay | Admitting: *Deleted

## 2016-08-13 DIAGNOSIS — G893 Neoplasm related pain (acute) (chronic): Principal | ICD-10-CM

## 2016-08-13 DIAGNOSIS — C7951 Secondary malignant neoplasm of bone: Secondary | ICD-10-CM

## 2016-08-13 MED ORDER — OXYCODONE HCL 5 MG PO TABS: 5.0000 mg | ORAL_TABLET | ORAL | 0 refills | Status: DC | PRN

## 2016-08-27 ENCOUNTER — Telehealth: Payer: Self-pay | Admitting: Cardiovascular Disease

## 2016-08-27 NOTE — Telephone Encounter (Signed)
New message      Talk to a nurse.  Pt is now under hospice care.  Daughter has questions regarding refilling meds since she is not able to come in for appts.  Please call

## 2016-08-27 NOTE — Telephone Encounter (Signed)
I spoke with the pt's daughter and she said the pt is now enrolled in [facility] and will not be able to come back for her 6 month follow-up. Per Cecille Rubin the [facility] nurse is asking if our office will still be able to prescribe Amlodipine, Metoprolol Tartrate and Plavix. I advised her that this is not an issue and that we authorized a year supply of refills at 05/2016 office visit.  Cecille Rubin asked that I make Fraser Din and Dr Angelena Form aware that her mother is now in Hospice care.  I will forward this message.

## 2016-09-03 ENCOUNTER — Other Ambulatory Visit: Payer: Self-pay | Admitting: *Deleted

## 2016-09-03 ENCOUNTER — Telehealth: Payer: Self-pay | Admitting: Medical Oncology

## 2016-09-03 DIAGNOSIS — G893 Neoplasm related pain (acute) (chronic): Principal | ICD-10-CM

## 2016-09-03 DIAGNOSIS — C7951 Secondary malignant neoplasm of bone: Secondary | ICD-10-CM

## 2016-09-03 MED ORDER — OXYCODONE HCL 5 MG PO TABS: 5.0000 mg | ORAL_TABLET | ORAL | 0 refills | Status: AC | PRN

## 2016-09-03 NOTE — Telephone Encounter (Signed)
Please fax oxycodone refill to [facility] [address].

## 2016-09-03 NOTE — Telephone Encounter (Signed)
Thanks

## 2016-09-04 ENCOUNTER — Ambulatory Visit: Payer: Self-pay | Admitting: Radiation Oncology

## 2016-09-11 ENCOUNTER — Inpatient Hospital Stay
Admission: RE | Admit: 2016-09-11 | Discharge: 2016-09-11 | Disposition: A | Discharge status: Home / Self Care | Payer: Self-pay | Source: Ambulatory Visit | Attending: Radiation Oncology | Admitting: Radiation Oncology

## 2016-09-17 ENCOUNTER — Telehealth: Payer: Self-pay

## 2016-09-17 NOTE — Telephone Encounter (Signed)
[name] with below message, verbalized understanding that it is okay to increase Oxycodone to 2-5 mg tablets every 3 hours.

## 2016-09-17 NOTE — Telephone Encounter (Signed)
[facility] called to say patient has declined over the weekend. She was started on Ativan for anxiety and Levsin for secretions. They need a order to increase Oxycodone 1-5 mg tablet q 3 hours to 2- 5 mg tablets.

## 2016-09-17 NOTE — Telephone Encounter (Signed)
Yes, pls proceed

## 2016-09-18 ENCOUNTER — Telehealth: Payer: Self-pay | Admitting: Hematology and Oncology

## 2016-09-25 NOTE — Telephone Encounter (Signed)
OK, will circulate a card

## 2016-09-25 NOTE — Telephone Encounter (Signed)
[facility] called to inform us that [name] passed away 2016/09/24 '@4'$ :47 am @ home

## 2016-09-25 DEATH — deceased
